# Patient Record
Sex: Male | Born: 1950
Health system: Southern US, Community
[De-identification: ages and names within clinical notes are randomized; demographics above are authoritative.]

## PROBLEM LIST (undated history)

## (undated) DIAGNOSIS — I10 Essential (primary) hypertension: Secondary | ICD-10-CM

## (undated) DIAGNOSIS — C3490 Malignant neoplasm of unspecified part of unspecified bronchus or lung: Secondary | ICD-10-CM

## (undated) DIAGNOSIS — C801 Malignant (primary) neoplasm, unspecified: Secondary | ICD-10-CM

## (undated) DIAGNOSIS — E785 Hyperlipidemia, unspecified: Secondary | ICD-10-CM

## (undated) DIAGNOSIS — R0600 Dyspnea, unspecified: Secondary | ICD-10-CM

## (undated) DIAGNOSIS — C61 Malignant neoplasm of prostate: Secondary | ICD-10-CM

## (undated) HISTORY — DX: Essential (primary) hypertension: I10

## (undated) HISTORY — DX: Malignant neoplasm of prostate: C61

## (undated) HISTORY — DX: Hyperlipidemia, unspecified: E78.5

---
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
## 1986-12-27 HISTORY — PX: HERNIA REPAIR: SHX51

## 2005-03-12 ENCOUNTER — Ambulatory Visit (HOSPITAL_COMMUNITY): Admission: RE | Admit: 2005-03-12 | Discharge: 2005-03-12 | Payer: Self-pay | Admitting: Gastroenterology

## 2011-08-02 ENCOUNTER — Ambulatory Visit (INDEPENDENT_AMBULATORY_CARE_PROVIDER_SITE_OTHER): Payer: Self-pay | Admitting: General Surgery

## 2011-09-20 ENCOUNTER — Ambulatory Visit (INDEPENDENT_AMBULATORY_CARE_PROVIDER_SITE_OTHER): Payer: 59 | Admitting: General Surgery

## 2011-09-20 ENCOUNTER — Encounter (INDEPENDENT_AMBULATORY_CARE_PROVIDER_SITE_OTHER): Payer: Self-pay | Admitting: General Surgery

## 2011-09-20 VITALS — BP 136/90 | HR 91 | Temp 98.2°F | Ht 68.0 in | Wt 164.0 lb

## 2011-09-20 DIAGNOSIS — K429 Umbilical hernia without obstruction or gangrene: Secondary | ICD-10-CM | POA: Insufficient documentation

## 2011-09-20 NOTE — Progress Notes (Signed)
Chief Complaint  Patient presents with  . Other    eval umb hernia    HPI Anthony Escobar is a 60 y.o. male.  Referred by Dr. Jeani Hawking. HPI This is a 60 year old male who has a history of bilateral groin hernias repaired. He has a recent colonoscopy that appears to be fairly normal. He went in to see Dr. Elnoria Howard following a colonoscopy complaining of some abdominal discomfort and some bloating. He has noticed some pain and a bulge in the region near his umbilicus as well since then. This area goes away when he is lying down and really doesn't cause him to much discomfort. He comes in today to discuss repair of this hernia.  History reviewed. No pertinent past medical history.  Past Surgical History  Procedure Date  . Hernia repair 1988    bilateral inguinal    Family History  Problem Relation Age of Onset  . Stroke Mother   . Cancer Father     esophageal    Social History History  Substance Use Topics  . Smoking status: Former Smoker    Types: Cigarettes    Quit date: 08/20/2011  . Smokeless tobacco: Not on file  . Alcohol Use: Yes     1beer a day    No Known Allergies  Current Outpatient Prescriptions  Medication Sig Dispense Refill  . amitriptyline (ELAVIL) 25 MG tablet Take 25 mg by mouth at bedtime.          Review of Systems Review of Systems  Constitutional: Negative.   HENT: Negative.   Eyes: Negative.   Respiratory: Negative.   Cardiovascular: Negative.   Gastrointestinal: Negative.   Genitourinary: Negative.   Musculoskeletal: Negative.   Neurological: Negative.   Hematological: Negative.   Psychiatric/Behavioral: Negative.     Blood pressure 136/90, pulse 91, temperature 98.2 F (36.8 C), height 5\' 8"  (1.727 m), weight 164 lb (74.39 kg).  Physical Exam Physical Exam  Constitutional: He appears well-developed and well-nourished.  Neck: Neck supple. No thyromegaly present.  Cardiovascular: Normal rate, regular rhythm and normal heart sounds.    Pulmonary/Chest: Effort normal and breath sounds normal. He has no wheezes. He has no rales.  Abdominal: Soft. Bowel sounds are normal. There is no tenderness. A hernia (umbilical hernia nontender, reducible) is present.  Lymphadenopathy:    He has no cervical adenopathy.     Assessment    Umbilical hernia    Plan    We discussed repair of this mildly symptomatic umbilical hernia. Recommended repair due to the fact that one large with time he is symptomatic as well as the small risk of having incarceration and needed emergency operation. We discussed an open umbilical hernia peritonitis the with mesh. The risks that include but are not limited to bleeding, infection, recurrence, seroma. He understands the postoperative were prescriptions and will plan on scheduling per his request in November.       Vernadette Stutsman 09/20/2011, 5:16 PM

## 2012-12-19 ENCOUNTER — Ambulatory Visit (INDEPENDENT_AMBULATORY_CARE_PROVIDER_SITE_OTHER): Payer: Medicare HMO | Admitting: Family Medicine

## 2012-12-19 VITALS — BP 144/91 | HR 98 | Temp 98.2°F | Resp 18 | Ht 67.0 in | Wt 167.0 lb

## 2012-12-19 DIAGNOSIS — M79644 Pain in right finger(s): Secondary | ICD-10-CM

## 2012-12-19 DIAGNOSIS — M79609 Pain in unspecified limb: Secondary | ICD-10-CM

## 2012-12-19 DIAGNOSIS — Z23 Encounter for immunization: Secondary | ICD-10-CM

## 2012-12-19 DIAGNOSIS — S61209A Unspecified open wound of unspecified finger without damage to nail, initial encounter: Secondary | ICD-10-CM

## 2012-12-19 MED ORDER — CEPHALEXIN 500 MG PO CAPS: 500.0000 mg | ORAL_CAPSULE | Freq: Three times a day (TID) | ORAL | Status: DC

## 2012-12-19 MED ORDER — TETANUS-DIPHTH-ACELL PERTUSSIS 5-2.5-18.5 LF-MCG/0.5 IM SUSP: 0.5000 mL | Freq: Once | INTRAMUSCULAR | Status: DC

## 2012-12-19 NOTE — Patient Instructions (Addendum)

## 2012-12-19 NOTE — Progress Notes (Signed)
61 year old gentleman comes in with laceration to left index finger which occurred several hours before arrival today. He is working on his car with a grinding wheel and it slipped and lacerated the proximal phalanx on the thumb side of his index finger. He's having no pain in the bleeding is stopped. Has full sedation the tip of his finger and is able to move the finger normally. He says his last tetanus shot was over 10 years ago.  Objective: No acute distress Examination of left index finger reveals a 2 cm laceration on the thumb side of his left index finger which extends into the pulp of the finger. There is no active bleeding. He has full sensation on the tip of his finger and he is good range of motion both flexion, extension, and abduction and adduction.l  Assessment: Acute laceration to left index finger with no complications regarding tendons, vascular structures, or nerve.

## 2012-12-19 NOTE — Progress Notes (Signed)
Procedure:  Consent obtained.  Local anesthesia with 1% lido.  Penrose drain used for hemostasis.  Irrigated due to debride in wound - dark flakes of something removed.  Bone seen and rough edges were felt.  No tendons seen.  Some vessels seen - but hemostasis was achieved without difficulty.  Wound closed with 5-0 Prolene #4 horizontal sutures and #1 SI.  Finger splint placed to decrease movement of joint.  Wound was ~ 2 cm in length. Pt had vasovagal response during procedure and his legs were elevated and cool clothes were placed on his forehead.  He left feeling fine.

## 2013-02-05 ENCOUNTER — Encounter (INDEPENDENT_AMBULATORY_CARE_PROVIDER_SITE_OTHER): Payer: 59 | Admitting: General Surgery

## 2013-02-20 ENCOUNTER — Encounter (INDEPENDENT_AMBULATORY_CARE_PROVIDER_SITE_OTHER): Payer: Self-pay | Admitting: General Surgery

## 2013-02-20 ENCOUNTER — Ambulatory Visit (INDEPENDENT_AMBULATORY_CARE_PROVIDER_SITE_OTHER): Payer: Managed Care, Other (non HMO) | Admitting: General Surgery

## 2013-02-20 VITALS — BP 140/90 | HR 106 | Temp 98.2°F | Resp 18 | Ht 68.0 in | Wt 168.2 lb

## 2013-02-20 DIAGNOSIS — K429 Umbilical hernia without obstruction or gangrene: Secondary | ICD-10-CM

## 2013-02-20 NOTE — Progress Notes (Signed)
Patient ID: Anthony Escobar, male   DOB: September 04, 1951, 62 y.o.   MRN: [id number]  Chief Complaint  Patient presents with  . Routine Post Op    rck umb hernia    HPI Anthony Escobar is a 62 y.o. male.   HPI 84 yom Who I initially saw in September of 2012 with a newly diagnosed umbilical hernia. He noticed some pain and bulge in the region his umbilicus. This still remains there is not really changed since our last visit. He comes in now as he would like to have a discussion about having this fixed. History reviewed. No pertinent past medical history.  Past Surgical History  Procedure Laterality Date  . Hernia repair  1988    bilateral inguinal    Family History  Problem Relation Age of Onset  . Stroke Mother   . Cancer Father     esophageal    Social History History  Substance Use Topics  . Smoking status: Former Smoker    Types: Cigarettes    Quit date: 08/20/2011  . Smokeless tobacco: Not on file  . Alcohol Use: Yes     Comment: 1beer a day    No Known Allergies  Current Outpatient Prescriptions  Medication Sig Dispense Refill  . amitriptyline (ELAVIL) 25 MG tablet Take 25 mg by mouth at bedtime.         No current facility-administered medications for this visit.    Review of Systems Review of Systems  Constitutional: Negative for fever, chills and unexpected weight change.  HENT: Negative for hearing loss, congestion, sore throat, trouble swallowing and voice change.   Eyes: Negative for visual disturbance.  Respiratory: Negative for cough and wheezing.   Cardiovascular: Negative for chest pain, palpitations and leg swelling.  Gastrointestinal: Negative for nausea, vomiting, abdominal pain, diarrhea, constipation, blood in stool, abdominal distention, anal bleeding and rectal pain.  Genitourinary: Negative for hematuria and difficulty urinating.  Musculoskeletal: Negative for arthralgias.  Skin: Negative for rash and wound.  Neurological: Negative for  seizures, syncope, weakness and headaches.  Hematological: Negative for adenopathy. Does not bruise/bleed easily.  Psychiatric/Behavioral: Negative for confusion.    Blood pressure 140/90, pulse 106, temperature 98.2 F (36.8 C), temperature source Temporal, resp. rate 18, height 5\' 8"  (1.727 m), weight 168 lb 3.2 oz (76.295 kg).  Physical Exam Physical Exam  Vitals reviewed. Constitutional: He appears well-developed and well-nourished.  Eyes: No scleral icterus.  Cardiovascular: Normal rate, regular rhythm and normal heart sounds.   Pulmonary/Chest: Effort normal. He has no wheezes. He has no rales.  Abdominal: Soft. Bowel sounds are normal. There is no tenderness. A hernia (reducible umbilical hernia) is present.  Lymphadenopathy:    He has no cervical adenopathy.      Assessment    Umbilical hernia     Plan    Umbilical hernia possibly with mesh We discussed observation versus repair.  We discussed open inguinal hernia repairs. I described the procedure in detail.   Goals should be achieved with surgery. We discussed the usage of mesh and the rationale behind that. We went over the pathophysiology of umbilical hernia. We have elected to perform open umbilical hernia repair possibly with mesh.  We discussed the risks including bleeding, infection, recurrence, postoperative pain, injury to surrounding structures         Nycole Kawahara 02/20/2013, 4:24 PM

## 2013-05-23 ENCOUNTER — Other Ambulatory Visit (INDEPENDENT_AMBULATORY_CARE_PROVIDER_SITE_OTHER): Payer: Self-pay | Admitting: General Surgery

## 2013-05-23 LAB — CBC WITH DIFFERENTIAL/PLATELET
Eosinophils Absolute: 0.1 10*3/uL (ref 0.0–0.7)
Eosinophils Relative: 1 % (ref 0–5)
HCT: 40.4 % (ref 39.0–52.0)
Hemoglobin: 13.6 g/dL (ref 13.0–17.0)
Lymphocytes Relative: 21 % (ref 12–46)
Lymphs Abs: 1.7 10*3/uL (ref 0.7–4.0)
MCH: 32.4 pg (ref 26.0–34.0)
MCV: 96.2 fL (ref 78.0–100.0)
Monocytes Absolute: 0.7 10*3/uL (ref 0.1–1.0)
Monocytes Relative: 9 % (ref 3–12)
RBC: 4.2 MIL/uL — ABNORMAL LOW (ref 4.22–5.81)
WBC: 8 10*3/uL (ref 4.0–10.5)

## 2013-05-23 LAB — BASIC METABOLIC PANEL
CO2: 23 mEq/L (ref 19–32)
Calcium: 9.4 mg/dL (ref 8.4–10.5)
Chloride: 105 mEq/L (ref 96–112)
Creat: 0.88 mg/dL (ref 0.50–1.35)
Glucose, Bld: 93 mg/dL (ref 70–99)

## 2013-05-24 DIAGNOSIS — K429 Umbilical hernia without obstruction or gangrene: Secondary | ICD-10-CM

## 2013-05-28 ENCOUNTER — Encounter (INDEPENDENT_AMBULATORY_CARE_PROVIDER_SITE_OTHER): Payer: Self-pay | Admitting: General Surgery

## 2013-05-28 ENCOUNTER — Telehealth (INDEPENDENT_AMBULATORY_CARE_PROVIDER_SITE_OTHER): Payer: Self-pay | Admitting: General Surgery

## 2013-05-28 NOTE — Telephone Encounter (Signed)
Spoke with patient he will come to office to schedule his po f/u appt . He has ask for a return to work letter that Dr Dwain Sarna will be writing today as well .Marland Kitchen

## 2013-05-29 ENCOUNTER — Telehealth (INDEPENDENT_AMBULATORY_CARE_PROVIDER_SITE_OTHER): Payer: Self-pay

## 2013-05-29 NOTE — Telephone Encounter (Signed)
Patient calling into office regarding his RTW note date.  Patient has returned to work and needed the date changed to 05/29/13.  New RTW letter created and faxed to [phone] attn: Seymour Bars.

## 2013-10-01 ENCOUNTER — Ambulatory Visit (INDEPENDENT_AMBULATORY_CARE_PROVIDER_SITE_OTHER): Payer: Managed Care, Other (non HMO) | Admitting: General Surgery

## 2013-10-01 ENCOUNTER — Encounter (INDEPENDENT_AMBULATORY_CARE_PROVIDER_SITE_OTHER): Payer: Self-pay | Admitting: General Surgery

## 2013-10-01 VITALS — BP 130/74 | HR 76 | Temp 98.8°F | Resp 14 | Ht 68.0 in | Wt 151.6 lb

## 2013-10-01 DIAGNOSIS — Z09 Encounter for follow-up examination after completed treatment for conditions other than malignant neoplasm: Secondary | ICD-10-CM

## 2013-10-01 DIAGNOSIS — R141 Gas pain: Secondary | ICD-10-CM

## 2013-10-01 DIAGNOSIS — R140 Abdominal distension (gaseous): Secondary | ICD-10-CM

## 2013-10-01 NOTE — Progress Notes (Signed)
Subjective:     Patient ID: Anthony Escobar, male   DOB: [birth year], 62 y.o.   MRN: [id number]  HPI 30 yom s/p UH repair from which he is doing well on May 29.  He has recovered from that just fine.  He reports though that he has abdominal bloating, thinner stools, nausea since I have last seen him.  He does have colonoscopy 2-3 years ago by Dr Elnoria Howard that he said was normal  Appetite not as good and has lost about 30 pounds (some intentionally he says).  No BRBPR.  Review of Systems     Objective:   Physical Exam Abdomen soft, nontender, incision healed UH    Assessment:     S/p UH repair Abdominal bloating, weight loss, thin stools    Plan:     I dont' think this is related to surgery.  I am concerned about his symptoms and will refer him back to see Dr Elnoria Howard as well as obtain a ct scan.

## 2013-10-03 ENCOUNTER — Ambulatory Visit
Admission: RE | Admit: 2013-10-03 | Discharge: 2013-10-03 | Disposition: A | Discharge status: Home / Self Care | Payer: Managed Care, Other (non HMO) | Source: Ambulatory Visit | Attending: General Surgery | Admitting: General Surgery

## 2013-10-03 DIAGNOSIS — R140 Abdominal distension (gaseous): Secondary | ICD-10-CM

## 2013-10-03 MED ORDER — IOHEXOL 300 MG/ML  SOLN
100.0000 mL | Freq: Once | INTRAMUSCULAR | Status: AC | PRN
  Administered 2013-10-03: 100 mL via INTRAVENOUS

## 2013-10-04 ENCOUNTER — Telehealth (INDEPENDENT_AMBULATORY_CARE_PROVIDER_SITE_OTHER): Payer: Self-pay

## 2013-10-04 NOTE — Telephone Encounter (Signed)
Called Anthony Escobar to notify him that we did receive the CT results which show some thickening of the colon but no masses or abscess per Dr Dwain Sarna. The Anthony Escobar just needs to get back into see Dr Elnoria Howard for a colonoscopy. The Anthony Escobar will call Dr Haywood Pao office to schedule appt.

## 2013-10-08 ENCOUNTER — Other Ambulatory Visit: Payer: Self-pay

## 2013-10-11 NOTE — Telephone Encounter (Signed)
Pt has an appt with Dr Elnoria Howard on 10/15/13 arrive at 1:50/2:00.

## 2013-11-01 ENCOUNTER — Other Ambulatory Visit: Payer: Self-pay

## 2013-11-08 ENCOUNTER — Other Ambulatory Visit: Payer: Self-pay

## 2017-10-18 ENCOUNTER — Ambulatory Visit (INDEPENDENT_AMBULATORY_CARE_PROVIDER_SITE_OTHER): Payer: PRIVATE HEALTH INSURANCE | Admitting: Emergency Medicine

## 2017-10-18 ENCOUNTER — Telehealth: Payer: Self-pay | Admitting: Emergency Medicine

## 2017-10-18 ENCOUNTER — Encounter: Payer: Self-pay | Admitting: Emergency Medicine

## 2017-10-18 ENCOUNTER — Ambulatory Visit (INDEPENDENT_AMBULATORY_CARE_PROVIDER_SITE_OTHER)
Admission: RE | Admit: 2017-10-18 | Discharge: 2017-10-18 | Disposition: A | Discharge status: Home / Self Care | Payer: Medicare Other | Source: Ambulatory Visit | Attending: Emergency Medicine | Admitting: Emergency Medicine

## 2017-10-18 VITALS — BP 102/62 | HR 100 | Ht 68.0 in | Wt 164.0 lb

## 2017-10-18 DIAGNOSIS — R059 Cough, unspecified: Secondary | ICD-10-CM | POA: Insufficient documentation

## 2017-10-18 DIAGNOSIS — R911 Solitary pulmonary nodule: Secondary | ICD-10-CM

## 2017-10-18 DIAGNOSIS — J449 Chronic obstructive pulmonary disease, unspecified: Secondary | ICD-10-CM | POA: Insufficient documentation

## 2017-10-18 DIAGNOSIS — Z720 Tobacco use: Secondary | ICD-10-CM | POA: Diagnosis not present

## 2017-10-18 DIAGNOSIS — R05 Cough: Secondary | ICD-10-CM | POA: Diagnosis not present

## 2017-10-18 DIAGNOSIS — R0609 Other forms of dyspnea: Secondary | ICD-10-CM

## 2017-10-18 NOTE — Progress Notes (Signed)
Subjective:    Patient ID: Anthony Escobar, male    DOB: [birth year], 66 y.o.   MRN: [id number]  HPI 66 year old former smoker (56 pack years, quit 1 year ago), with a history of hypertension, hyperlipidemia.  He is a self-referral today to evaluate shortness of breath. He first noticed this 2-3 months ago, found that he was having more chest congestion as well. He retired this Summer, has been doing a bit less since then. When he does outside yard work, bicycling. He is able to cough up mucous, often at night. He hears some wheeze at night as well, when he exerts. He has noticed some L costal marginal pain from coughing. He has been on lisinopril for about 2 yrs. He has never had an overt exacerbation of COPD. He has a globus sensation. He just restarted tagamet last week to see if it would help w cough. He has some mild baseline nasal congestion   Review of Systems  Constitutional: Negative for fever and unexpected weight change.  HENT: Negative for congestion, dental problem, ear pain, nosebleeds, postnasal drip, rhinorrhea, sinus pressure, sneezing, sore throat and trouble swallowing.   Eyes: Negative for redness and itching.  Respiratory: Positive for chest tightness and shortness of breath. Negative for cough and wheezing.   Cardiovascular: Negative for palpitations and leg swelling.  Gastrointestinal: Negative for nausea and vomiting.  Genitourinary: Negative for dysuria.  Musculoskeletal: Negative for joint swelling.  Skin: Negative for rash.  Neurological: Negative for headaches.  Hematological: Does not bruise/bleed easily.  Psychiatric/Behavioral: Negative for dysphoric mood. The patient is not nervous/anxious.     Past Medical History:  Diagnosis Date  . Hyperlipidemia   . Hypertension      Family History  Problem Relation Age of Onset  . Stroke Mother   . Cancer Father        esophageal     Social History   Social History  . Marital status: Single    Spouse  name: N/A  . Number of children: N/A  . Years of education: N/A   Occupational History  . Not on file.   Social History Main Topics  . Smoking status: Former Smoker    Packs/day: 1.50    Years: 50.00    Types: Cigarettes    Quit date: 08/20/2011  . Smokeless tobacco: Never Used  . Alcohol use Yes     Comment: 1beer a day  . Drug use: No  . Sexual activity: Not on file   Other Topics Concern  . Not on file   Social History Narrative  . 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  [place] native.   No Known Allergies   Outpatient Medications Prior to Visit  Medication Sig Dispense Refill  . lisinopril (PRINIVIL,ZESTRIL) 10 MG tablet Take 10 mg by mouth daily.     . Vitamin D, Ergocalciferol, (DRISDOL) 50000 UNITS CAPS capsule     . zolpidem (AMBIEN) 5 MG tablet      No facility-administered medications prior to visit.         Objective:   Physical Exam Vitals:   10/18/17 1123 10/18/17 1124  BP:  102/62  Pulse:  100  SpO2:  95%  Weight: 164 lb (74.4 kg)   Height: 5\' 8"  (1.727 m)    Gen: Pleasant, well-nourished, in no distress,  normal affect  ENT: No lesions,  mouth clear,  oropharynx clear, no postnasal drip  Neck: No  JVD, no stridor  Lungs: No use of accessory muscles, clear during a normal respiratory cycle, he does wheeze with forced expiration  Cardiovascular: RRR, heart sounds normal, no murmur or gallops, no peripheral edema  Musculoskeletal: No deformities, no cyanosis or clubbing  Neuro: alert, non focal  Skin: Warm, no lesions or rashes     Assessment & Plan:  Dyspnea on exertion Suspect due to evolving COPD.  He no longer smokes.  We will perform pulmonary function testing, do a trial of short acting bronchodilators to see if he benefits.  If he does and if the pulmonary function testing is consistent with obstruction then we will consider starting scheduled bronchodilators.  Cough Associated  with his COPD.  Chest x-ray today.  Consider contribution of his lisinopril, may need to stop this at some point in the future.  Tobacco use Former tobacco, quit 1 year ago.  He would qualify for low-dose CT scan screening if his pulmonary function is suitable to consider primary resection of any identified abnormality.  I will discussed this with him further at follow-up visit.  Baltazar Apo, MD, PhD 10/18/2017, 11:50 AM [facility] [phone] or if no answer [phone]

## 2017-10-18 NOTE — Telephone Encounter (Signed)
RB has been made aware of the pt's CXR results.  Per RB >> There are nodules present and I am unclear what they are. Pt needs to have a CT chest with contrast to evaluate.  Spoke with pt. He is aware of his results. Order has been placed for the CT and a BMET. Nothing further was needed.

## 2017-10-18 NOTE — Assessment & Plan Note (Signed)
Associated with his COPD.  Chest x-ray today.  Consider contribution of his lisinopril, may need to stop this at some point in the future.

## 2017-10-18 NOTE — Patient Instructions (Addendum)
We will perform pulmonary function testing We will perform a CXR today Try using albuterol 2 puffs as needed for shortness of breath.  You can use this up to every 4 hours if you were to need it.  You might want to consider pretreating exercise or strenuous activity to see if it makes the activity easier to tolerate with less shortness of breath. Follow with Dr Lamonte Sakai next available with full PFT on the same day

## 2017-10-18 NOTE — Telephone Encounter (Signed)
Please txt me the results of CXR today when report is available - I question whether there may be pulm nodules present (versus vessels) He wanted to get feedback about the results today. Thanks/

## 2017-10-18 NOTE — Assessment & Plan Note (Signed)
Suspect due to evolving COPD.  He no longer smokes.  We will perform pulmonary function testing, do a trial of short acting bronchodilators to see if he benefits.  If he does and if the pulmonary function testing is consistent with obstruction then we will consider starting scheduled bronchodilators.

## 2017-10-18 NOTE — Assessment & Plan Note (Signed)
Former tobacco, quit 1 year ago.  He would qualify for low-dose CT scan screening if his pulmonary function is suitable to consider primary resection of any identified abnormality.  I will discussed this with him further at follow-up visit.

## 2017-10-19 ENCOUNTER — Other Ambulatory Visit (INDEPENDENT_AMBULATORY_CARE_PROVIDER_SITE_OTHER): Payer: Medicare Other

## 2017-10-19 DIAGNOSIS — R911 Solitary pulmonary nodule: Secondary | ICD-10-CM | POA: Diagnosis not present

## 2017-10-19 LAB — BASIC METABOLIC PANEL
BUN: 19 mg/dL (ref 6–23)
CALCIUM: 9.5 mg/dL (ref 8.4–10.5)
CO2: 27 meq/L (ref 19–32)
Chloride: 102 mEq/L (ref 96–112)
Creatinine, Ser: 1.08 mg/dL (ref 0.40–1.50)
GFR: 72.64 mL/min (ref >60.00)
Glucose, Bld: 89 mg/dL (ref 70–99)
Potassium: 5 mEq/L (ref 3.5–5.1)
Sodium: 137 mEq/L (ref 135–145)

## 2017-10-20 ENCOUNTER — Ambulatory Visit (INDEPENDENT_AMBULATORY_CARE_PROVIDER_SITE_OTHER)
Admission: RE | Admit: 2017-10-20 | Discharge: 2017-10-20 | Disposition: A | Discharge status: Home / Self Care | Payer: PRIVATE HEALTH INSURANCE | Source: Ambulatory Visit | Attending: Emergency Medicine | Admitting: Emergency Medicine

## 2017-10-20 DIAGNOSIS — R911 Solitary pulmonary nodule: Secondary | ICD-10-CM

## 2017-10-20 MED ORDER — IOPAMIDOL (ISOVUE-M 300) INJECTION 61%
15.0000 mL | Freq: Once | INTRAMUSCULAR | Status: AC | PRN
  Administered 2017-10-20: 80 mL via INTRATHECAL

## 2017-10-24 ENCOUNTER — Telehealth: Payer: Self-pay | Admitting: Emergency Medicine

## 2017-10-24 NOTE — Telephone Encounter (Signed)
Pt requesting CT scan results, pt contact # [phone]

## 2017-10-24 NOTE — Telephone Encounter (Signed)
RB can you review CT scan results?

## 2017-10-24 NOTE — Telephone Encounter (Signed)
There are multiple small pulmonary nodules present. Their cause is unclear, but could be infectious, inflammatory or possibly even cancerous. He needs to follow with me so we can make a plan to investigate these. Make sure he has OV next available.

## 2017-10-24 NOTE — Telephone Encounter (Signed)
Spoke with the pt and notified of results per RB and he verbalized understanding  Appt was scheduled

## 2017-10-28 ENCOUNTER — Ambulatory Visit (INDEPENDENT_AMBULATORY_CARE_PROVIDER_SITE_OTHER): Payer: PRIVATE HEALTH INSURANCE | Admitting: Emergency Medicine

## 2017-10-28 ENCOUNTER — Other Ambulatory Visit (INDEPENDENT_AMBULATORY_CARE_PROVIDER_SITE_OTHER): Payer: Medicare Other

## 2017-10-28 ENCOUNTER — Encounter: Payer: Self-pay | Admitting: Emergency Medicine

## 2017-10-28 DIAGNOSIS — R911 Solitary pulmonary nodule: Secondary | ICD-10-CM

## 2017-10-28 DIAGNOSIS — R918 Other nonspecific abnormal finding of lung field: Secondary | ICD-10-CM | POA: Insufficient documentation

## 2017-10-28 DIAGNOSIS — R0609 Other forms of dyspnea: Secondary | ICD-10-CM | POA: Diagnosis not present

## 2017-10-28 LAB — PSA: PSA: 19.57 ng/mL — AB (ref 0.10–4.00)

## 2017-10-28 MED ORDER — ALBUTEROL SULFATE HFA 108 (90 BASE) MCG/ACT IN AERS: 2.0000 | INHALATION_SPRAY | RESPIRATORY_TRACT | 6 refills | Status: DC | PRN

## 2017-10-28 NOTE — Assessment & Plan Note (Signed)
Bilateral pulmonary nodules of varying sizes.  All are round and the overall picture is concerning for metastatic disease.  No clear primary cancer currently evident.  I believe he needs a PET scan as soon as possible.  I will check some screening serologies for tumor markers.  He needs an echocardiogram to ensure no evidence for endocarditis although I doubt infection in this case.  Based on location and size probably a transthoracic needle biopsy of the right lower lobe nodule will be the highest yield maneuver.  We will arrange for this after the PET scan.

## 2017-10-28 NOTE — Assessment & Plan Note (Signed)
I will start him empirically on albuterol to see if he benefits.

## 2017-10-28 NOTE — Patient Instructions (Addendum)
Please start using albuterol 2 puffs up to every 4 hours as needed to see if this helps with your breathing  We will get your breathing tests in the future.  We will arrange for a PET scan to better evaluate your pulmonary nodules.  We will perform blood work today.  We will perform an echocardiogram to evaluate your heart valves.  Follow with Dr Lamonte Sakai next available appointment

## 2017-10-28 NOTE — Addendum Note (Signed)
Addended by: Rocky Morel D on: 10/28/2017 03:55 PM   Modules accepted: Orders

## 2017-10-28 NOTE — Progress Notes (Signed)
Subjective:    Patient ID: LEV CERVONE, male    DOB: [birth year], 66 y.o.   MRN: [id number]  HPI 66 year old former smoker (35 pack years, quit 1 year ago), with a history of hypertension, hyperlipidemia.  He is a self-referral today to evaluate shortness of breath. He first noticed this 2-3 months ago, found that he was having more chest congestion as well. He retired this Summer, has been doing a bit less since then. When he does outside yard work, bicycling. He is able to cough up mucous, often at night. He hears some wheeze at night as well, when he exerts. He has noticed some L costal marginal pain from coughing. He has been on lisinopril for about 2 yrs. He has never had an overt exacerbation of COPD. He has a globus sensation. He just restarted tagamet last week to see if it would help w cough. He has some mild baseline nasal congestion  ROV 10/28/17 --this is a follow-up visit for patient with a history of tobacco use, dyspnea.  Performed a chest x-ray at our initial visit 10/23 which suggested pulmonary nodular disease and prompted a CT scan of the chest that was done on 10/20/17.  The scan shows some emphysematous change and numerous small pulmonary nodules seen bilaterally.  The index nodules are in the right lower lobe measuring 10 mm, the lingula measuring 9 mm.  Etiology unclear.  PFTs have not yet been done.  He is here to review the scan. He is up to date on colon CA screening. No GI complaints, no testicular mass reported, no urinary dysfxn.    Review of Systems  Constitutional: Negative for fever and unexpected weight change.  HENT: Negative for congestion, dental problem, ear pain, nosebleeds, postnasal drip, rhinorrhea, sinus pressure, sneezing, sore throat and trouble swallowing.   Eyes: Negative for redness and itching.  Respiratory: Positive for chest tightness and shortness of breath. Negative for cough and wheezing.   Cardiovascular: Negative for palpitations and leg  swelling.  Gastrointestinal: Negative for nausea and vomiting.  Genitourinary: Negative for dysuria.  Musculoskeletal: Negative for joint swelling.  Skin: Negative for rash.  Neurological: Negative for headaches.  Hematological: Does not bruise/bleed easily.  Psychiatric/Behavioral: Negative for dysphoric mood. The patient is not nervous/anxious.     Past Medical History:  Diagnosis Date  . Hyperlipidemia   . Hypertension      Family History  Problem Relation Age of Onset  . Stroke Mother   . Cancer Father        esophageal     Social History   Social History  . Marital status: Single    Spouse name: N/A  . Number of children: N/A  . Years of education: N/A   Occupational History  . Not on file.   Social History Main Topics  . Smoking status: Former Smoker    Packs/day: 1.50    Years: 50.00    Types: Cigarettes    Quit date: 08/20/2011  . Smokeless tobacco: Never Used  . Alcohol use Yes     Comment: 1beer a day  . Drug use: No  . Sexual activity: Not on file   Other Topics Concern  . Not on file   Social History Narrative  . 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  [place] native.   No Known Allergies   Outpatient Medications Prior to Visit  Medication Sig Dispense Refill  . amitriptyline (ELAVIL) 75  MG tablet Take 75 mg by mouth at bedtime.    Marland Kitchen atorvastatin (LIPITOR) 10 MG tablet Take 10 mg by mouth daily.    Marland Kitchen lisinopril (PRINIVIL,ZESTRIL) 10 MG tablet Take 10 mg by mouth daily.      No facility-administered medications prior to visit.         Objective:   Physical Exam Vitals:   10/28/17 1511  BP: 124/88  Pulse: (!) 110  SpO2: 98%  Weight: 160 lb (72.6 kg)  Height: 5\' 8"  (1.727 m)   Gen: Pleasant, well-nourished, in no distress,  normal affect  ENT: No lesions,  mouth clear,  oropharynx clear, no postnasal drip  Neck: No JVD, no stridor  Lungs: No use of accessory muscles, clear  during a normal respiratory cycle, he does wheeze with forced expiration  Cardiovascular: RRR, heart sounds normal, no murmur or gallops, no peripheral edema  Musculoskeletal: No deformities, no cyanosis or clubbing  Neuro: alert, non focal  Skin: Warm, no lesions or rashes     Assessment & Plan:  Multiple pulmonary nodules Bilateral pulmonary nodules of varying sizes.  All are round and the overall picture is concerning for metastatic disease.  No clear primary cancer currently evident.  I believe he needs a PET scan as soon as possible.  I will check some screening serologies for tumor markers.  He needs an echocardiogram to ensure no evidence for endocarditis although I doubt infection in this case.  Based on location and size probably a transthoracic needle biopsy of the right lower lobe nodule will be the highest yield maneuver.  We will arrange for this after the PET scan.  Dyspnea on exertion I will start him empirically on albuterol to see if he benefits.  Baltazar Apo, MD, PhD 10/28/2017, 3:51 PM [facility] [phone] or if no answer [phone]

## 2017-10-31 LAB — AFP TUMOR MARKER: AFP-Tumor Marker: 5.9 ng/mL (ref <6.1)

## 2017-10-31 LAB — HCG, SERUM, QUALITATIVE: PREG SERUM: NEGATIVE

## 2017-10-31 LAB — CANCER ANTIGEN 19-9: CA 19-9: 25 U/mL (ref <34)

## 2017-10-31 LAB — CEA: CEA: 3.3 ng/mL — AB

## 2017-11-03 ENCOUNTER — Ambulatory Visit (HOSPITAL_COMMUNITY): Payer: PRIVATE HEALTH INSURANCE | Attending: Cardiology

## 2017-11-03 ENCOUNTER — Other Ambulatory Visit: Payer: Self-pay

## 2017-11-03 DIAGNOSIS — I10 Essential (primary) hypertension: Secondary | ICD-10-CM | POA: Diagnosis not present

## 2017-11-03 DIAGNOSIS — R911 Solitary pulmonary nodule: Secondary | ICD-10-CM | POA: Diagnosis not present

## 2017-11-03 DIAGNOSIS — I059 Rheumatic mitral valve disease, unspecified: Secondary | ICD-10-CM | POA: Insufficient documentation

## 2017-11-03 DIAGNOSIS — E785 Hyperlipidemia, unspecified: Secondary | ICD-10-CM | POA: Insufficient documentation

## 2017-11-03 DIAGNOSIS — Z8249 Family history of ischemic heart disease and other diseases of the circulatory system: Secondary | ICD-10-CM | POA: Diagnosis not present

## 2017-11-03 DIAGNOSIS — R002 Palpitations: Secondary | ICD-10-CM | POA: Diagnosis not present

## 2017-11-03 DIAGNOSIS — R42 Dizziness and giddiness: Secondary | ICD-10-CM | POA: Diagnosis not present

## 2017-11-03 DIAGNOSIS — Z87891 Personal history of nicotine dependence: Secondary | ICD-10-CM | POA: Insufficient documentation

## 2017-11-03 DIAGNOSIS — R0600 Dyspnea, unspecified: Secondary | ICD-10-CM | POA: Insufficient documentation

## 2017-11-03 DIAGNOSIS — I77810 Thoracic aortic ectasia: Secondary | ICD-10-CM | POA: Insufficient documentation

## 2017-11-05 ENCOUNTER — Ambulatory Visit (HOSPITAL_COMMUNITY)
Admission: RE | Admit: 2017-11-05 | Discharge: 2017-11-05 | Disposition: A | Discharge status: Home / Self Care | Payer: PRIVATE HEALTH INSURANCE | Source: Ambulatory Visit | Attending: Emergency Medicine | Admitting: Emergency Medicine

## 2017-11-05 DIAGNOSIS — R9389 Abnormal findings on diagnostic imaging of other specified body structures: Secondary | ICD-10-CM | POA: Diagnosis not present

## 2017-11-05 DIAGNOSIS — C7802 Secondary malignant neoplasm of left lung: Secondary | ICD-10-CM | POA: Insufficient documentation

## 2017-11-05 DIAGNOSIS — C7801 Secondary malignant neoplasm of right lung: Secondary | ICD-10-CM | POA: Insufficient documentation

## 2017-11-05 DIAGNOSIS — C7951 Secondary malignant neoplasm of bone: Secondary | ICD-10-CM | POA: Insufficient documentation

## 2017-11-05 DIAGNOSIS — R911 Solitary pulmonary nodule: Secondary | ICD-10-CM | POA: Diagnosis not present

## 2017-11-05 LAB — GLUCOSE, CAPILLARY: Glucose-Capillary: 99 mg/dL (ref 65–99)

## 2017-11-05 MED ORDER — FLUDEOXYGLUCOSE F - 18 (FDG) INJECTION
8.0000 | Freq: Once | INTRAVENOUS | Status: AC | PRN
  Administered 2017-11-05: 8 via INTRAVENOUS

## 2017-11-11 ENCOUNTER — Telehealth: Payer: Self-pay | Admitting: Emergency Medicine

## 2017-11-11 DIAGNOSIS — C61 Malignant neoplasm of prostate: Secondary | ICD-10-CM

## 2017-11-11 NOTE — Telephone Encounter (Signed)
RB please advise on CT results.

## 2017-11-16 NOTE — Telephone Encounter (Signed)
Pt aware that RB is out of the office until 11-22-17 and we will send message as Urgent to reply same day with results.

## 2017-11-22 NOTE — Telephone Encounter (Signed)
Called and spoke to pt. Pt is requesting PET results from 11/05/17.  RB please advise. Thanks.

## 2017-11-22 NOTE — Telephone Encounter (Signed)
Pt calling again about Pet scan results.  He is getting very upset he has not had a call back. tr

## 2017-11-22 NOTE — Telephone Encounter (Signed)
We have to await RB review and instructions.

## 2017-11-22 NOTE — Telephone Encounter (Signed)
Pt still waiting on results from Pet Scan.  [phone]

## 2017-11-22 NOTE — Telephone Encounter (Signed)
I reviewed the results with Anthony Escobar today.  His PET scan shows bilateral scattered hypermetabolic nodules as well as significant hypermetabolism in the prostate.  His elevated PSA and these results suggest that this is metastatic prostate cancer.  I believe the most straightforward approach is urgent referral to urology for biopsy and a plan for therapy.  I discussed this with him and he agrees.  We will make the referral now and have him seen ASAP.

## 2017-11-22 NOTE — Telephone Encounter (Signed)
I have called the patient and gave him the appt for 11/24/17 with Dr Karsten Ro pt aware

## 2017-11-25 ENCOUNTER — Telehealth: Payer: Self-pay | Admitting: Emergency Medicine

## 2017-11-25 MED ORDER — ZOLPIDEM TARTRATE 10 MG PO TABS: 10.0000 mg | ORAL_TABLET | Freq: Every evening | ORAL | 0 refills | Status: DC | PRN

## 2017-11-25 MED ORDER — ALBUTEROL SULFATE HFA 108 (90 BASE) MCG/ACT IN AERS: 2.0000 | INHALATION_SPRAY | RESPIRATORY_TRACT | 6 refills | Status: DC | PRN

## 2017-11-25 NOTE — Telephone Encounter (Signed)
Spoke with pt letting him know I was sending in Rx to [facility] choice [facility] on [address]. Nothing further needed.  Rx [facility] so have called and gave verbal order for Rx to be filled.

## 2017-11-25 NOTE — Telephone Encounter (Signed)
I spoke with the patient and reviewed his studies and his recent visit with Dr. Karsten Ro with urology.  He is planning to undergo prostate biopsy.  He tells me that he is quite nervous and upset about the diagnosis.  He cannot get any sleep and requests a sleep aid.  I will write for Ambien to try and help him sleep in the interim.  His prostate biopsy is scheduled for early January.  I will check with Dr. Karsten Ro to see if it can be pushed sooner.   Please send a script for ambien 10mg  qhs, disp #14, RF 0  Also send script for albuterol, 2 puffs q4h prn to [facility], [address]

## 2017-11-25 NOTE — Telephone Encounter (Signed)
Called and spoke to pt. Pt is upset about recent scan and the results. Pt states RB reviewed the results with him on 11.27.18. Pt is requesting to speak with Dr. Lamonte Escobar again regarding results in more detail and regarding an anti-depressant or anti-anxiety medication. Pt states he is having trouble sleeping at night due to recent results. Pt states he can be reached at [phone] any time today. Pt is requesting a call today.   Dr. Lamonte Escobar please advise. Thanks.

## 2017-11-29 ENCOUNTER — Encounter: Payer: Self-pay | Admitting: Emergency Medicine

## 2017-11-29 ENCOUNTER — Ambulatory Visit: Payer: Medicare Other | Admitting: Emergency Medicine

## 2017-11-29 ENCOUNTER — Ambulatory Visit (INDEPENDENT_AMBULATORY_CARE_PROVIDER_SITE_OTHER): Payer: PRIVATE HEALTH INSURANCE | Admitting: Emergency Medicine

## 2017-11-29 DIAGNOSIS — J449 Chronic obstructive pulmonary disease, unspecified: Secondary | ICD-10-CM | POA: Diagnosis not present

## 2017-11-29 DIAGNOSIS — R918 Other nonspecific abnormal finding of lung field: Secondary | ICD-10-CM

## 2017-11-29 DIAGNOSIS — R0609 Other forms of dyspnea: Secondary | ICD-10-CM

## 2017-11-29 LAB — PULMONARY FUNCTION TEST
DL/VA % pred: 66 %
DL/VA: 2.97 ml/min/mmHg/L
DLCO cor % pred: 64 %
DLCO cor: 19.18 ml/min/mmHg
DLCO unc % pred: 61 %
DLCO unc: 18.13 ml/min/mmHg
FEF 25-75 Post: 1.5 L/sec
FEF 25-75 Pre: 1.14 L/sec
FEF2575-%Change-Post: 31 %
FEF2575-%Pred-Post: 60 %
FEF2575-%Pred-Pre: 46 %
FEV1-%Change-Post: 8 %
FEV1-%Pred-Post: 75 %
FEV1-%Pred-Pre: 69 %
FEV1-Post: 2.37 L
FEV1-Pre: 2.18 L
FEV1FVC-%Change-Post: 0 %
FEV1FVC-%Pred-Pre: 83 %
FEV6-%Change-Post: 7 %
FEV6-%Pred-Post: 93 %
FEV6-%Pred-Pre: 86 %
FEV6-Post: 3.72 L
FEV6-Pre: 3.45 L
FEV6FVC-%Change-Post: 0 %
FEV6FVC-%Pred-Post: 103 %
FEV6FVC-%Pred-Pre: 103 %
FVC-%Change-Post: 8 %
FVC-%Pred-Post: 90 %
FVC-%Pred-Pre: 83 %
FVC-Post: 3.8 L
FVC-Pre: 3.51 L
Post FEV1/FVC ratio: 62 %
Post FEV6/FVC ratio: 98 %
Pre FEV1/FVC ratio: 62 %
Pre FEV6/FVC Ratio: 98 %

## 2017-11-29 MED ORDER — TIOTROPIUM BROMIDE MONOHYDRATE 18 MCG IN CAPS: 18.0000 ug | ORAL_CAPSULE | Freq: Every day | RESPIRATORY_TRACT | 5 refills | Status: DC

## 2017-11-29 NOTE — Patient Instructions (Signed)
Please follow-up with Dr. Karsten Ro with Urology as planned.  We will start a medication called Spiriva 1 inhalation once a day.  Keep your albuterol available to use 2 puffs as needed for shortness of breath, wheezing, chest tightness Follow with Dr Lamonte Sakai in 3 months or sooner if you have any problems.

## 2017-11-29 NOTE — Assessment & Plan Note (Signed)
Clinical history and pulmonary function testing consistent with COPD.  He believes that he benefits some from albuterol although he has not tried it very frequently.  I believe that he would benefit from schedule bronchodilator.  We will start Spiriva once daily.  Continue albuterol as needed.

## 2017-11-29 NOTE — Progress Notes (Signed)
Subjective:    Patient ID: Anthony Escobar, male    DOB: [birth year], 66 y.o.   MRN: [id number]  HPI 66 year old former smoker (50 pack years, quit 1 year ago), with a history of hypertension, hyperlipidemia.  He is a self-referral today to evaluate shortness of breath. He first noticed this 2-3 months ago, found that he was having more chest congestion as well. He retired this Summer, has been doing a bit less since then. When he does outside yard work, bicycling. He is able to cough up mucous, often at night. He hears some wheeze at night as well, when he exerts. He has noticed some L costal marginal pain from coughing. He has been on lisinopril for about 2 yrs. He has never had an overt exacerbation of COPD. He has a globus sensation. He just restarted tagamet last week to see if it would help w cough. He has some mild baseline nasal congestion  ROV 10/28/17 --this is a follow-up visit for patient with a history of tobacco use, dyspnea.  Performed a chest x-ray at our initial visit 10/23 which suggested pulmonary nodular disease and prompted a CT scan of the chest that was done on 10/20/17.  The scan shows some emphysematous change and numerous small pulmonary nodules seen bilaterally.  The index nodules are in the right lower lobe measuring 10 mm, the lingula measuring 9 mm.  Etiology unclear.  PFTs have not yet been done.  He is here to review the scan. He is up to date on colon CA screening. No GI complaints, no testicular mass reported, no urinary dysfxn.   ROV 11/29/17 --Anthony Escobar follows up today for his abnormal chest imaging with bilateral round pulmonary nodules.  The PET scan performed on 11/05/17 showed that these are hypermetabolic and also showed an area of hypermetabolism in the prostate.  He has seen Dr. Karsten Ro with urology.  A prostate biopsy has been arranged.  This is all consistent with metastatic prostate cancer.  He underwent pulmonary function testing today that I have personally  reviewed.  This shows moderately severe obstruction with a borderline bronchodilator response, decreased diffusion capacity. He is using albuterol, believes that it is beneficial. He is having some situational depression, tearfulness.    Review of Systems  Constitutional: Negative for fever and unexpected weight change.  HENT: Negative for congestion, dental problem, ear pain, nosebleeds, postnasal drip, rhinorrhea, sinus pressure, sneezing, sore throat and trouble swallowing.   Eyes: Negative for redness and itching.  Respiratory: Positive for chest tightness and shortness of breath. Negative for cough and wheezing.   Cardiovascular: Negative for palpitations and leg swelling.  Gastrointestinal: Negative for nausea and vomiting.  Genitourinary: Negative for dysuria.  Musculoskeletal: Negative for joint swelling.  Skin: Negative for rash.  Neurological: Negative for headaches.  Hematological: Does not bruise/bleed easily.  Psychiatric/Behavioral: Negative for dysphoric mood. The patient is not nervous/anxious.     Past Medical History:  Diagnosis Date  . Hyperlipidemia   . Hypertension      Family History  Problem Relation Age of Onset  . Stroke Mother   . Cancer Father        esophageal     Social History   Socioeconomic History  . Marital status: Single    Spouse name: Not on file  . Number of children: Not on file  . Years of education: Not on file  . Highest education level: Not on file  Social Needs  . Financial resource strain: Not  on file  . Food insecurity - worry: Not on file  . Food insecurity - inability: Not on file  . Transportation needs - medical: Not on file  . Transportation needs - non-medical: Not on file  Occupational History  . Not on file  Tobacco Use  . Smoking status: Former Smoker    Packs/day: 1.50    Years: 50.00    Pack years: 75.00    Types: Cigarettes    Last attempt to quit: 08/20/2011    Years since quitting: 6.2  . Smokeless  tobacco: Never Used  Substance and Sexual Activity  . Alcohol use: Yes    Comment: 1beer a day  . Drug use: No  . Sexual activity: Not on file  Other Topics Concern  . Not on file  Social History Narrative  . Not on file  Has worked Therapist, music, Garment/textile technologist No fumes, powders No [employer]  [place] native.   No Known Allergies   Outpatient Medications Prior to Visit  Medication Sig Dispense Refill  . albuterol (PROVENTIL HFA;VENTOLIN HFA) 108 (90 Base) MCG/ACT inhaler Inhale 2 puffs into the lungs every 4 (four) hours as needed for wheezing or shortness of breath. 1 Inhaler 6  . amitriptyline (ELAVIL) 75 MG tablet Take 75 mg by mouth at bedtime.    Marland Kitchen atorvastatin (LIPITOR) 10 MG tablet Take 10 mg by mouth daily.    Marland Kitchen lisinopril (PRINIVIL,ZESTRIL) 10 MG tablet Take 10 mg by mouth daily.     Marland Kitchen zolpidem (AMBIEN) 10 MG tablet Take 1 tablet (10 mg total) by mouth at bedtime as needed for sleep. 14 tablet 0   No facility-administered medications prior to visit.         Objective:   Physical Exam Vitals:   11/29/17 1011 11/29/17 1013  BP:  116/80  Pulse:  (!) 119  SpO2:  97%  Weight: 158 lb (71.7 kg)   Height: 5\' 8"  (1.727 m)    Gen: Pleasant, well-nourished, in no distress,  normal affect  ENT: No lesions,  mouth clear,  oropharynx clear, no postnasal drip  Neck: No JVD, no stridor  Lungs: No use of accessory muscles, clear during a normal respiratory cycle, he does wheeze with forced expiration  Cardiovascular: RRR, heart sounds normal, no murmur or gallops, no peripheral edema  Musculoskeletal: No deformities, no cyanosis or clubbing  Neuro: alert, non focal  Skin: Warm, no lesions or rashes     Assessment & Plan:  COPD (chronic obstructive pulmonary disease) (HCC) Clinical history and pulmonary function testing consistent with COPD.  He believes that he benefits some from albuterol although he has not tried it very frequently.  I believe that he would benefit from  schedule bronchodilator.  We will start Spiriva once daily.  Continue albuterol as needed.  Multiple pulmonary nodules PSA, PET scan consistent with metastatic prostate cancer to the lungs.  He is planning for a prostatic biopsy with Dr. Karsten Ro with urology.  This is scheduled for early January.  At his request I will speak with Dr. Karsten Ro, see if it would be possible or reasonable to move the test up sooner.  I tried to reassure Mr. Badeaux that there are options for treatment even in metastatic prostate cancer that would allow suppression of his disease and good quality of life.   Baltazar Apo, MD, PhD 11/29/2017, 10:40 AM [facility] [phone] or if no answer [phone]

## 2017-11-29 NOTE — Progress Notes (Signed)
PFT done today. 

## 2017-11-29 NOTE — Assessment & Plan Note (Signed)
PSA, PET scan consistent with metastatic prostate cancer to the lungs.  He is planning for a prostatic biopsy with Dr. Karsten Ro with urology.  This is scheduled for early January.  At his request I will speak with Dr. Karsten Ro, see if it would be possible or reasonable to move the test up sooner.  I tried to reassure Anthony Escobar that there are options for treatment even in metastatic prostate cancer that would allow suppression of his disease and good quality of life.

## 2017-12-08 ENCOUNTER — Telehealth: Payer: Self-pay | Admitting: Emergency Medicine

## 2017-12-08 NOTE — Telephone Encounter (Signed)
Called and spoke with pt and he is aware of the recs from [place].  Nothing further is needed.

## 2017-12-08 NOTE — Telephone Encounter (Signed)
Called and spoke with pt and he stated that his hips feel like they are inflamed and he is having a hard time getting out of bed and walking around.  He stated that this feeling is radiating into his feet as well.  He is having a hard time sleeping.  He has used tylenol, advil and aleve and none of these have worked for him.  RB please advise.  Pt stated that he has not contacted his PCP about this yet.    No Known Allergies

## 2017-12-08 NOTE — Telephone Encounter (Signed)
He has tried the medications that I would recommend given the kind of discomfort that he is experiencing.  I agree that he needs to talk with his primary care physician to decide whether there is safe medication that is stronger.

## 2018-01-02 ENCOUNTER — Telehealth: Payer: Self-pay | Admitting: Emergency Medicine

## 2018-01-02 NOTE — Telephone Encounter (Signed)
Patient came and dropped of cancer policy forms to be completed - sent to [facility] via interoffice mail -pr

## 2018-01-06 ENCOUNTER — Telehealth: Payer: Self-pay | Admitting: Oncology

## 2018-01-06 NOTE — Telephone Encounter (Signed)
Spoke with patient regarding appointment D/T/Loc/Ph#.Referring MD also notified with

## 2018-01-11 NOTE — Telephone Encounter (Signed)
Rec'd critical illness forms back from [facility] - fwd to [place] for RB to complete -pr

## 2018-01-19 ENCOUNTER — Encounter: Payer: Self-pay | Admitting: Medical Oncology

## 2018-01-19 ENCOUNTER — Telehealth: Payer: Self-pay | Admitting: Oncology

## 2018-01-19 ENCOUNTER — Inpatient Hospital Stay: Payer: Medicare Other | Attending: Oncology | Admitting: Oncology

## 2018-01-19 VITALS — BP 140/90 | HR 104 | Temp 97.9°F | Resp 18 | Ht 68.0 in | Wt 153.8 lb

## 2018-01-19 DIAGNOSIS — Z79899 Other long term (current) drug therapy: Secondary | ICD-10-CM

## 2018-01-19 DIAGNOSIS — R102 Pelvic and perineal pain: Secondary | ICD-10-CM | POA: Diagnosis not present

## 2018-01-19 DIAGNOSIS — C7951 Secondary malignant neoplasm of bone: Secondary | ICD-10-CM | POA: Diagnosis not present

## 2018-01-19 DIAGNOSIS — Z7189 Other specified counseling: Secondary | ICD-10-CM

## 2018-01-19 DIAGNOSIS — C61 Malignant neoplasm of prostate: Secondary | ICD-10-CM | POA: Diagnosis not present

## 2018-01-19 DIAGNOSIS — C7800 Secondary malignant neoplasm of unspecified lung: Secondary | ICD-10-CM | POA: Diagnosis not present

## 2018-01-19 MED ORDER — PROCHLORPERAZINE MALEATE 10 MG PO TABS: 10.0000 mg | ORAL_TABLET | Freq: Four times a day (QID) | ORAL | 0 refills | Status: DC | PRN

## 2018-01-19 MED ORDER — OXYCODONE HCL 5 MG PO TABS: 5.0000 mg | ORAL_TABLET | ORAL | 0 refills | Status: DC | PRN

## 2018-01-19 NOTE — Progress Notes (Signed)
Introduced myself to Mr. Thielke and his granddaughter as the prostate nurse navigator and my role. He states that he had a chest x-ray in October that led him to a bronch, prostate biopsy and a diagnosis of cancer. He has been having hip pain and though he had sciatica but learned it is cancer. Dr.Ottelin prescribed hydrocodone for pain but it is not working well. He and his granddaughter are tearful and anxious. He states he is overwhelmed and worried about treatment today. I informed that Dr.Shadad will discuss his diagnosis and treatment plans but no treatments today. This helped to reduce his anxiety. I did discuss prostate support group and our support staff to help them. I will continue to follow and asked them to call with questions or concerns.

## 2018-01-19 NOTE — Telephone Encounter (Signed)
Gave avs and calendar for February and march °

## 2018-01-19 NOTE — Progress Notes (Signed)
START ON PATHWAY REGIMEN - Prostate   Docetaxel 75 mg/m2:   A cycle is every 21 days:     Docetaxel   **Always [facility] Bicalutamide:   A cycle is every 12 weeks:     Leuprolide acetate depot    Daily:     Bicalutamide   **Always confirm dose/schedule in your pharmacy ordering system**    Patient Characteristics: Adenocarcinoma, Metastatic, Hormone Naive, High Volume Disease* Current radiographic evidence of distant metastasis<= Yes Histology: Adenocarcinoma AJCC T Category: cTX Gleason Primary: X AJCC N Category: NX Gleason Secondary: X AJCC M Category: M1c Gleason Score: X AJCC 8 Stage Grouping: IVB PSA Values (ng/mL): X Would you be surprised if this patient died  in the next year<= I would be surprised if this patient died in the next year Intent of Therapy: Non-Curative / Palliative Intent, Discussed with Patient

## 2018-01-19 NOTE — Progress Notes (Signed)
Reason for Referral: Prostate cancer.  HPI: 67 year old gentleman currently in [place] where he lived the majority of his life.  He has history of hypertension and hyperlipidemia but for the most part in reasonable health.  He started developing symptoms of shortness of breath and cough in October 2018.  His evaluation included a chest x-ray and subsequently a CT scan of the chest obtained on October 20, 2017 which showed the diffuse bilateral pulmonary nodules suspicious for metastasis.  He was evaluated by Dr. Lamonte Sakai and a PET/CT scan obtained on 11/05/2017 showed marked uptake in his prostate as well as increased metabolic activity in the bilateral pulmonary metastasis as well as the skeletal metastasis including the pelvis.  Based on these findings, he was referred to Dr. Karsten Ro and underwent a prostate biopsy on December 29, 2017 showed a Gleason score 4+4 = 8 and 5 cores and Gleason score 4+3 = 7 and 3 cores.  Based on these findings, he was started on Firmagon anticipate starting Lupron in the near future.  Clinically, he reports continuous pain in his right hip and upper femur.  His pain is sharp and mostly exacerbated at nighttime.  Not associated with any weakness or neurological deficits.  He is able to bear weight and had not had any falls or syncope.  He has been taken hydrocodone which have helped his symptoms some although not as much.  He does take also ibuprofen at times.  His appetite has been affected as well and have had about 15 pound weight loss.  He is urination is adequate and moves his bowels regularly.   He does not report any headaches, blurry vision, syncope or seizures. Does not report any fevers, chills or sweats.  Does not report any cough, wheezing or hemoptysis.  Does not report any chest pain, palpitation, orthopnea or leg edema.  Does not report any nausea, vomiting or abdominal pain.  She does not report any constipation or diarrhea.  Does not report any skeletal  complaints.    Does not report frequency, urgency or hematuria.  Does not report any skin rashes or lesions. Does not report any heat or cold intolerance.  Does not report any lymphadenopathy or petechiae.  Does not report any anxiety or depression.  Remaining review of systems is negative.      Past Medical History:  Diagnosis Date  . Hyperlipidemia   . Hypertension   :  Past Surgical History:  Procedure Laterality Date  . HERNIA REPAIR  1988   bilateral inguinal  :   Current Outpatient Medications:  .  albuterol (PROVENTIL HFA;VENTOLIN HFA) 108 (90 Base) MCG/ACT inhaler, Inhale 2 puffs into the lungs every 4 (four) hours as needed for wheezing or shortness of breath., Disp: 1 Inhaler, Rfl: 6 .  amitriptyline (ELAVIL) 75 MG tablet, Take 75 mg by mouth at bedtime., Disp: , Rfl:  .  atorvastatin (LIPITOR) 10 MG tablet, Take 10 mg by mouth daily., Disp: , Rfl:  .  lisinopril (PRINIVIL,ZESTRIL) 10 MG tablet, Take 10 mg by mouth daily. , Disp: , Rfl:  .  tiotropium (SPIRIVA) 18 MCG inhalation capsule, Place 1 capsule (18 mcg total) into inhaler and inhale daily., Disp: 30 capsule, Rfl: 5 .  oxyCODONE (OXY IR/ROXICODONE) 5 MG immediate release tablet, Take 1 tablet (5 mg total) by mouth every 4 (four) hours as needed for severe pain., Disp: 30 tablet, Rfl: 0 .  prochlorperazine (COMPAZINE) 10 MG tablet, Take 1 tablet (10 mg total) by mouth every 6 (  six) hours as needed for nausea or vomiting., Disp: 30 tablet, Rfl: 0 .  zolpidem (AMBIEN) 10 MG tablet, Take 1 tablet (10 mg total) by mouth at bedtime as needed for sleep., Disp: 14 tablet, Rfl: 0:  No Known Allergies:  Family History  Problem Relation Age of Onset  . Stroke Mother   . Cancer Father        esophageal  :  Social History   Socioeconomic History  . Marital status: Single    Spouse name: Not on file  . Number of children: Not on file  . Years of education: Not on file  . Highest education level: Not on file  Social  Needs  . Financial resource strain: Not on file  . Food insecurity - worry: Not on file  . Food insecurity - inability: Not on file  . Transportation needs - medical: Not on file  . Transportation needs - non-medical: Not on file  Occupational History  . Not on file  Tobacco Use  . Smoking status: Former Smoker    Packs/day: 1.50    Years: 50.00    Pack years: 75.00    Types: Cigarettes    Last attempt to quit: 08/20/2011    Years since quitting: 6.4  . Smokeless tobacco: Never Used  Substance and Sexual Activity  . Alcohol use: Yes    Comment: 1beer a day  . Drug use: No  . Sexual activity: Not on file  Other Topics Concern  . Not on file  Social History Narrative  . Not on file  :  Pertinent items are noted in HPI.  Exam: Blood pressure 140/90, pulse (!) 104, temperature 97.9 F (36.6 C), temperature source Oral, resp. rate 18, height 5\' 8"  (1.727 m), weight 153 lb 12.8 oz (69.8 kg), SpO2 98 %. ECOG 0 General appearance: alert and cooperative appeared without distress. Eyes: conjunctivae/corneas clear. PERRL.  Throat: lips, mucosa, and tongue normal; teeth and gums normal Resp: clear to auscultation bilaterally without wheezes or dullness to percussion. Cardio: regular rate and rhythm, S1, S2 normal, no murmur, click, rub or gallop GI: soft, non-tender; bowel sounds normal; no masses,  no organomegaly Musculoskeletal: He has full range of motion in all joints.  No point tenderness or effusion noted on his right hip. Skin: Skin color, texture, turgor normal. No rashes or lesions Lymph nodes: Cervical, supraclavicular, and axillary nodes normal. Neurologic: Grossly normal  Psychiatric: Appropriate mood and affect.  Although he was emotional to learn about his diagnosis.  CBC    Component Value Date/Time   WBC 8.0 05/23/2013 1422   RBC 4.20 (L) 05/23/2013 1422   HGB 13.6 05/23/2013 1422   HCT 40.4 05/23/2013 1422   PLT 364 05/23/2013 1422   MCV 96.2 05/23/2013 1422    MCH 32.4 05/23/2013 1422   MCHC 33.7 05/23/2013 1422   RDW 13.4 05/23/2013 1422   LYMPHSABS 1.7 05/23/2013 1422   MONOABS 0.7 05/23/2013 1422   EOSABS 0.1 05/23/2013 1422   BASOSABS 0.0 05/23/2013 1422     Chemistry      Component Value Date/Time   NA 137 10/19/2017 0725   K 5.0 10/19/2017 0725   CL 102 10/19/2017 0725   CO2 27 10/19/2017 0725   BUN 19 10/19/2017 0725   CREATININE 1.08 10/19/2017 0725   CREATININE 0.88 05/23/2013 1422      Component Value Date/Time   CALCIUM 9.5 10/19/2017 0725       Assessment and Plan:   67 year old  gentleman with the following issues:  1.  Prostate cancer: Diagnosed in January 2019.  He presented with pulmonary metastasis as well as right bony metastasis.  PET/CT scan obtained in November 2018 showed FDG uptake in those areas.  Prostate biopsy confirmed the diagnosis of Gleason score 4+4 = 8 prostate cancer.  His PSA was 19.57.  He was started on androgen deprivation in the form of [place] Koller and will start Lupron in the near future under the care of Dr. Karsten Ro.  The natural course of prostate cancer was discussed today with the patient and his granddaughter.  He has advanced disease and understands that he has an incurable malignancy.  This is a cancer that can be palliated appropriately for a period of time.  The backbone of treating advanced prostate cancer is androgen deprivation and he is already started.  The rationale for using Taxotere chemotherapy in addition to androgen deprivation was discussed.  The benefit would include extending overall survival and progression free survival by adding 6 cycles of Taxotere chemotherapy to androgen deprivation.  Complication associated with this therapy including nausea, vomiting, myelosuppression, neutropenia and rarely neutropenic sepsis and need for hospitalization.  Peripheral neuropathy as well as infusion related complications are also noted.  After discussion today he is agreeable to proceed  after chemotherapy education class.  2.  IV access: His peripheral veins will be used in a Port-A-Cath can be considered if IV access becomes an issue.  3.  Antiemetics: Prescription for Compazine was given to the patient today.  4.  Neutropenia prophylaxis: He will receive Neulasta after each cycle of chemotherapy.  5.  Pelvic pain: I will obtain an MRI of the right hip and consider radiation therapy if pain becomes an issue.  I gave him a prescription for oxycodone with instructions how to use it and alternate with ibuprofen.  6.  Bone directed therapy: Delton See will be considered in the future.  7.  Prognosis: He has an incurable malignancy but has an excellent performance status.  Aggressive therapy is warranted and his cancer can be palliated for an extended period of time.  8.  Follow-up: We will be in the next few weeks to start chemotherapy.  60  minutes was spent with the patient face-to-face today.  More than 50% of time was dedicated to patient counseling, education and coordination of his multifaceted care.

## 2018-01-26 ENCOUNTER — Inpatient Hospital Stay: Payer: Medicare Other

## 2018-01-26 ENCOUNTER — Encounter: Payer: Self-pay | Admitting: *Deleted

## 2018-01-26 NOTE — Telephone Encounter (Signed)
Rec'd completed forms. Fwd to [facility] via interoffice mail -pr

## 2018-02-02 ENCOUNTER — Other Ambulatory Visit: Payer: Self-pay | Admitting: *Deleted

## 2018-02-02 ENCOUNTER — Encounter: Payer: Self-pay | Admitting: Oncology

## 2018-02-02 ENCOUNTER — Inpatient Hospital Stay: Payer: PRIVATE HEALTH INSURANCE

## 2018-02-02 ENCOUNTER — Inpatient Hospital Stay: Payer: PRIVATE HEALTH INSURANCE | Attending: Oncology

## 2018-02-02 VITALS — BP 170/90 | HR 79 | Temp 97.8°F | Resp 18

## 2018-02-02 DIAGNOSIS — Z5111 Encounter for antineoplastic chemotherapy: Secondary | ICD-10-CM | POA: Diagnosis present

## 2018-02-02 DIAGNOSIS — Z5189 Encounter for other specified aftercare: Secondary | ICD-10-CM | POA: Diagnosis not present

## 2018-02-02 DIAGNOSIS — C61 Malignant neoplasm of prostate: Secondary | ICD-10-CM

## 2018-02-02 LAB — CBC WITH DIFFERENTIAL (CANCER CENTER ONLY)
BASOS ABS: 0.1 10*3/uL (ref 0.0–0.1)
BASOS PCT: 1 %
Eosinophils Absolute: 0.1 10*3/uL (ref 0.0–0.5)
Eosinophils Relative: 1 %
HEMATOCRIT: 36.9 % — AB (ref 38.4–49.9)
Hemoglobin: 12.7 g/dL — ABNORMAL LOW (ref 13.0–17.1)
Lymphocytes Relative: 17 %
Lymphs Abs: 1.5 10*3/uL (ref 0.9–3.3)
MCH: 33.6 pg — ABNORMAL HIGH (ref 27.2–33.4)
MCHC: 34.4 g/dL (ref 32.0–36.0)
MCV: 97.7 fL (ref 79.3–98.0)
MONO ABS: 0.7 10*3/uL (ref 0.1–0.9)
Monocytes Relative: 9 %
NEUTROS ABS: 6.3 10*3/uL (ref 1.5–6.5)
Neutrophils Relative %: 72 %
PLATELETS: 488 10*3/uL — AB (ref 140–400)
RBC: 3.78 MIL/uL — ABNORMAL LOW (ref 4.20–5.82)
RDW: 13 % (ref 11.0–14.6)
WBC Count: 8.7 10*3/uL (ref 4.0–10.3)

## 2018-02-02 LAB — CMP (CANCER CENTER ONLY)
ALBUMIN: 3.7 g/dL (ref 3.5–5.0)
ALT: 13 U/L (ref 0–55)
ANION GAP: 10 (ref 3–11)
AST: 19 U/L (ref 5–34)
Alkaline Phosphatase: 154 U/L — ABNORMAL HIGH (ref 40–150)
BILIRUBIN TOTAL: 0.3 mg/dL (ref 0.2–1.2)
BUN: 14 mg/dL (ref 7–26)
CALCIUM: 9.3 mg/dL (ref 8.4–10.4)
CO2: 24 mmol/L (ref 22–29)
Chloride: 104 mmol/L (ref 98–109)
Creatinine: 0.88 mg/dL (ref 0.70–1.30)
Glucose, Bld: 88 mg/dL (ref 70–140)
Potassium: 4.4 mmol/L (ref 3.5–5.1)
Sodium: 138 mmol/L (ref 136–145)
TOTAL PROTEIN: 7.1 g/dL (ref 6.4–8.3)

## 2018-02-02 MED ORDER — SODIUM CHLORIDE 0.9 % IV SOLN
Freq: Once | INTRAVENOUS | Status: AC
  Administered 2018-02-02: 10:00:00 via INTRAVENOUS

## 2018-02-02 MED ORDER — DEXAMETHASONE SODIUM PHOSPHATE 10 MG/ML IJ SOLN
INTRAMUSCULAR | Status: AC
  Filled 2018-02-02: qty 1

## 2018-02-02 MED ORDER — DEXAMETHASONE SODIUM PHOSPHATE 10 MG/ML IJ SOLN
10.0000 mg | Freq: Once | INTRAMUSCULAR | Status: AC
  Administered 2018-02-02: 10 mg via INTRAVENOUS

## 2018-02-02 MED ORDER — SODIUM CHLORIDE 0.9 % IV SOLN
75.0000 mg/m2 | Freq: Once | INTRAVENOUS | Status: AC
  Administered 2018-02-02: 140 mg via INTRAVENOUS
  Filled 2018-02-02: qty 14

## 2018-02-02 NOTE — Patient Instructions (Addendum)
[facility] Discharge Instructions for Patients Receiving Chemotherapy  Today you received the following chemotherapy agents Taxotere  To help prevent nausea and vomiting after your treatment, we encourage you to take your nausea medication as directed   If you develop nausea and vomiting that is not controlled by your nausea medication, call the clinic.   BELOW ARE SYMPTOMS THAT SHOULD BE REPORTED IMMEDIATELY:  *FEVER GREATER THAN 100.5 F  *CHILLS WITH OR WITHOUT FEVER  NAUSEA AND VOMITING THAT IS NOT CONTROLLED WITH YOUR NAUSEA MEDICATION  *UNUSUAL SHORTNESS OF BREATH  *UNUSUAL BRUISING OR BLEEDING  TENDERNESS IN MOUTH AND THROAT WITH OR WITHOUT PRESENCE OF ULCERS  *URINARY PROBLEMS  *BOWEL PROBLEMS  UNUSUAL RASH Items with * indicate a potential emergency and should be followed up as soon as possible.  Feel free to call the clinic should you have any questions or concerns. The clinic phone number is [phone].  Please show the [facility] at check-in to the Emergency Department and triage nurse.   Docetaxel injection What is this medicine? DOCETAXEL (doe se TAX el) is a chemotherapy drug. It targets fast dividing cells, like cancer cells, and causes these cells to die. This medicine is used to treat many types of cancers like breast cancer, certain stomach cancers, head and neck cancer, lung cancer, and prostate cancer. This medicine may be used for other purposes; ask your health care provider or pharmacist if you have questions. COMMON BRAND NAME(S): Docefrez, Taxotere What should I tell my health care provider before I take this medicine? They need to know if you have any of these conditions: -infection (especially a virus infection such as chickenpox, cold sores, or herpes) -liver disease -low blood counts, like low white cell, platelet, or red cell counts -an unusual or allergic reaction to docetaxel, polysorbate 80, other chemotherapy  agents, other medicines, foods, dyes, or preservatives -pregnant or trying to get pregnant -breast-feeding How should I use this medicine? This drug is given as an infusion into a vein. It is administered in a hospital or clinic by a specially trained health care professional. Talk to your pediatrician regarding the use of this medicine in children. Special care may be needed. Overdosage: If you think you have taken too much of this medicine contact a poison control center or emergency room at once. NOTE: This medicine is only for you. Do not share this medicine with others. What if I miss a dose? It is important not to miss your dose. Call your doctor or health care professional if you are unable to keep an appointment. What may interact with this medicine? -cyclosporine -erythromycin -ketoconazole -medicines to increase blood counts like filgrastim, pegfilgrastim, sargramostim -vaccines Talk to your doctor or health care professional before taking any of these medicines: -acetaminophen -aspirin -ibuprofen -ketoprofen -naproxen This list may not describe all possible interactions. Give your health care provider a list of all the medicines, herbs, non-prescription drugs, or dietary supplements you use. Also tell them if you smoke, drink alcohol, or use illegal drugs. Some items may interact with your medicine. What should I watch for while using this medicine? Your condition will be monitored carefully while you are receiving this medicine. You will need important blood work done while you are taking this medicine. This drug may make you feel generally unwell. This is not uncommon, as chemotherapy can affect healthy cells as well as cancer cells. Report any side effects. Continue your course of treatment even though you feel ill  unless your doctor tells you to stop. In some cases, you may be given additional medicines to help with side effects. Follow all directions for their use. Call  your doctor or health care professional for advice if you get a fever, chills or sore throat, or other symptoms of a cold or flu. Do not treat yourself. This drug decreases your body's ability to fight infections. Try to avoid being around people who are sick. This medicine may increase your risk to bruise or bleed. Call your doctor or health care professional if you notice any unusual bleeding. This medicine may contain alcohol in the product. You may get drowsy or dizzy. Do not drive, use machinery, or do anything that needs mental alertness until you know how this medicine affects you. Do not stand or sit up quickly, especially if you are an older patient. This reduces the risk of dizzy or fainting spells. Avoid alcoholic drinks. Do not become pregnant while taking this medicine. Women should inform their doctor if they wish to become pregnant or think they might be pregnant. There is a potential for serious side effects to an unborn child. Talk to your health care professional or pharmacist for more information. Do not breast-feed an infant while taking this medicine. What side effects may I notice from receiving this medicine? Side effects that you should report to your doctor or health care professional as soon as possible: -allergic reactions like skin rash, itching or hives, swelling of the face, lips, or tongue -low blood counts - This drug may decrease the number of white blood cells, red blood cells and platelets. You may be at increased risk for infections and bleeding. -signs of infection - fever or chills, cough, sore throat, pain or difficulty passing urine -signs of decreased platelets or bleeding - bruising, pinpoint red spots on the skin, black, tarry stools, nosebleeds -signs of decreased red blood cells - unusually weak or tired, fainting spells, lightheadedness -breathing problems -fast or irregular heartbeat -low blood pressure -mouth sores -nausea and vomiting -pain, swelling,  redness or irritation at the injection site -pain, tingling, numbness in the hands or feet -swelling of the ankle, feet, hands -weight gain Side effects that usually do not require medical attention (report to your doctor or health care professional if they continue or are bothersome): -bone pain -complete hair loss including hair on your head, underarms, pubic hair, eyebrows, and eyelashes -diarrhea -excessive tearing -changes in the color of fingernails -loosening of the fingernails -nausea -muscle pain -red flush to skin -sweating -weak or tired This list may not describe all possible side effects. Call your doctor for medical advice about side effects. You may report side effects to FDA at 1-800-FDA-1088. Where should I keep my medicine? This drug is given in a hospital or clinic and will not be stored at home. NOTE: This sheet is a summary. It may not cover all possible information. If you have questions about this medicine, talk to your doctor, pharmacist, or health care provider.  2018 Elsevier/Gold Standard (2016-01-15 12:32:56)

## 2018-02-02 NOTE — Progress Notes (Signed)
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  Unfortunately there aren't any foundations offering copay assistance for his Dx so I offered the [employer].  Pt would like to apply so he will bring his proof of income on 02/03/18.  If approved I will go over how the grant works and give him an expense sheet as well as my card for any questions or concerns he may have in the future.

## 2018-02-03 ENCOUNTER — Encounter: Payer: Self-pay | Admitting: Oncology

## 2018-02-03 ENCOUNTER — Ambulatory Visit: Payer: Medicare Other

## 2018-02-03 ENCOUNTER — Inpatient Hospital Stay: Payer: PRIVATE HEALTH INSURANCE

## 2018-02-03 ENCOUNTER — Telehealth: Payer: Self-pay | Admitting: Oncology

## 2018-02-03 DIAGNOSIS — C61 Malignant neoplasm of prostate: Secondary | ICD-10-CM

## 2018-02-03 DIAGNOSIS — Z5189 Encounter for other specified aftercare: Secondary | ICD-10-CM | POA: Diagnosis not present

## 2018-02-03 MED ORDER — PEGFILGRASTIM INJECTION 6 MG/0.6ML ~~LOC~~
6.0000 mg | PREFILLED_SYRINGE | Freq: Once | SUBCUTANEOUS | Status: AC
  Administered 2018-02-03: 6 mg via SUBCUTANEOUS

## 2018-02-03 NOTE — Patient Instructions (Signed)
Pegfilgrastim injection What is this medicine? PEGFILGRASTIM (PEG fil gra stim) is a long-acting granulocyte colony-stimulating factor that stimulates the growth of neutrophils, a type of white blood cell important in the body's fight against infection. It is used to reduce the incidence of fever and infection in patients with certain types of cancer who are receiving chemotherapy that affects the bone marrow, and to increase survival after being exposed to high doses of radiation. This medicine may be used for other purposes; ask your health care provider or pharmacist if you have questions. COMMON BRAND NAME(S): Neulasta What should I tell my health care provider before I take this medicine? They need to know if you have any of these conditions: -kidney disease -latex allergy -ongoing radiation therapy -sickle cell disease -skin reactions to acrylic adhesives (On-Body Injector only) -an unusual or allergic reaction to pegfilgrastim, filgrastim, other medicines, foods, dyes, or preservatives -pregnant or trying to get pregnant -breast-feeding How should I use this medicine? This medicine is for injection under the skin. If you get this medicine at home, you will be taught how to prepare and give the pre-filled syringe or how to use the On-body Injector. Refer to the patient Instructions for Use for detailed instructions. Use exactly as directed. Tell your healthcare provider immediately if you suspect that the On-body Injector may not have performed as intended or if you suspect the use of the On-body Injector resulted in a missed or partial dose. It is important that you put your used needles and syringes in a special sharps container. Do not put them in a trash can. If you do not have a sharps container, call your pharmacist or healthcare provider to get one. Talk to your pediatrician regarding the use of this medicine in children. While this drug may be prescribed for selected conditions,  precautions do apply. Overdosage: If you think you have taken too much of this medicine contact a poison control center or emergency room at once. NOTE: This medicine is only for you. Do not share this medicine with others. What if I miss a dose? It is important not to miss your dose. Call your doctor or health care professional if you miss your dose. If you miss a dose due to an On-body Injector failure or leakage, a new dose should be administered as soon as possible using a single prefilled syringe for manual use. What may interact with this medicine? Interactions have not been studied. Give your health care provider a list of all the medicines, herbs, non-prescription drugs, or dietary supplements you use. Also tell them if you smoke, drink alcohol, or use illegal drugs. Some items may interact with your medicine. This list may not describe all possible interactions. Give your health care provider a list of all the medicines, herbs, non-prescription drugs, or dietary supplements you use. Also tell them if you smoke, drink alcohol, or use illegal drugs. Some items may interact with your medicine. What should I watch for while using this medicine? You may need blood work done while you are taking this medicine. If you are going to need a MRI, CT scan, or other procedure, tell your doctor that you are using this medicine (On-Body Injector only). What side effects may I notice from receiving this medicine? Side effects that you should report to your doctor or health care professional as soon as possible: -allergic reactions like skin rash, itching or hives, swelling of the face, lips, or tongue -dizziness -fever -pain, redness, or irritation at site   where injected -pinpoint red spots on the skin -red or dark-brown urine -shortness of breath or breathing problems -stomach or side pain, or pain at the shoulder -swelling -tiredness -trouble passing urine or change in the amount of urine Side  effects that usually do not require medical attention (report to your doctor or health care professional if they continue or are bothersome): -bone pain -muscle pain This list may not describe all possible side effects. Call your doctor for medical advice about side effects. You may report side effects to FDA at 1-800-FDA-1088. Where should I keep my medicine? Keep out of the reach of children. Store pre-filled syringes in a refrigerator between 2 and 8 degrees C (36 and 46 degrees F). Do not freeze. Keep in carton to protect from light. Throw away this medicine if it is left out of the refrigerator for more than 48 hours. Throw away any unused medicine after the expiration date. NOTE: This sheet is a summary. It may not cover all possible information. If you have questions about this medicine, talk to your doctor, pharmacist, or health care provider.  2018 Elsevier/Gold Standard (2016-12-09 12:58:03)  

## 2018-02-03 NOTE — Telephone Encounter (Signed)
Per [facility] nurse had to move appointment from 11:45 to 1:00 for today .  Patient notified

## 2018-02-03 NOTE — Progress Notes (Signed)
Pt is approved for the $400 [facility] and $200 prostate grants.

## 2018-02-22 ENCOUNTER — Other Ambulatory Visit: Payer: Self-pay | Admitting: Oncology

## 2018-02-22 DIAGNOSIS — C61 Malignant neoplasm of prostate: Secondary | ICD-10-CM

## 2018-02-23 ENCOUNTER — Inpatient Hospital Stay: Payer: PRIVATE HEALTH INSURANCE

## 2018-02-23 ENCOUNTER — Inpatient Hospital Stay (HOSPITAL_BASED_OUTPATIENT_CLINIC_OR_DEPARTMENT_OTHER): Payer: PRIVATE HEALTH INSURANCE | Admitting: Oncology

## 2018-02-23 ENCOUNTER — Encounter: Payer: Self-pay | Admitting: Medical Oncology

## 2018-02-23 VITALS — BP 117/73 | HR 110 | Temp 98.0°F | Resp 18 | Ht 68.0 in | Wt 159.9 lb

## 2018-02-23 DIAGNOSIS — C61 Malignant neoplasm of prostate: Secondary | ICD-10-CM

## 2018-02-23 DIAGNOSIS — C7800 Secondary malignant neoplasm of unspecified lung: Secondary | ICD-10-CM

## 2018-02-23 DIAGNOSIS — R102 Pelvic and perineal pain: Secondary | ICD-10-CM | POA: Diagnosis not present

## 2018-02-23 DIAGNOSIS — Z5189 Encounter for other specified aftercare: Secondary | ICD-10-CM | POA: Diagnosis not present

## 2018-02-23 DIAGNOSIS — C7951 Secondary malignant neoplasm of bone: Secondary | ICD-10-CM

## 2018-02-23 DIAGNOSIS — Z79899 Other long term (current) drug therapy: Secondary | ICD-10-CM

## 2018-02-23 LAB — CBC WITH DIFFERENTIAL (CANCER CENTER ONLY)
BASOS ABS: 0 10*3/uL (ref 0.0–0.1)
BASOS PCT: 0 %
Eosinophils Absolute: 0 10*3/uL (ref 0.0–0.5)
Eosinophils Relative: 0 %
HEMATOCRIT: 37.4 % — AB (ref 38.4–49.9)
HEMOGLOBIN: 12.2 g/dL — AB (ref 13.0–17.1)
LYMPHS PCT: 18 %
Lymphs Abs: 1.8 10*3/uL (ref 0.9–3.3)
MCH: 32.4 pg (ref 27.2–33.4)
MCHC: 32.6 g/dL (ref 32.0–36.0)
MCV: 99.2 fL — ABNORMAL HIGH (ref 79.3–98.0)
Monocytes Absolute: 1.1 10*3/uL — ABNORMAL HIGH (ref 0.1–0.9)
Monocytes Relative: 12 %
NEUTROS ABS: 6.9 10*3/uL — AB (ref 1.5–6.5)
NEUTROS PCT: 70 %
Platelet Count: 567 10*3/uL — ABNORMAL HIGH (ref 140–400)
RBC: 3.77 MIL/uL — AB (ref 4.20–5.82)
RDW: 12.9 % (ref 11.0–14.6)
WBC: 9.8 10*3/uL (ref 4.0–10.3)

## 2018-02-23 LAB — CMP (CANCER CENTER ONLY)
ALBUMIN: 3.7 g/dL (ref 3.5–5.0)
ALT: 20 U/L (ref 0–55)
AST: 19 U/L (ref 5–34)
Alkaline Phosphatase: 90 U/L (ref 40–150)
Anion gap: 11 (ref 3–11)
BILIRUBIN TOTAL: 0.3 mg/dL (ref 0.2–1.2)
BUN: 14 mg/dL (ref 7–26)
CO2: 27 mmol/L (ref 22–29)
Calcium: 9.6 mg/dL (ref 8.4–10.4)
Chloride: 100 mmol/L (ref 98–109)
Creatinine: 0.9 mg/dL (ref 0.70–1.30)
GFR, Est AFR Am: >60 mL/min (ref >60)
GLUCOSE: 90 mg/dL (ref 70–140)
POTASSIUM: 4.1 mmol/L (ref 3.5–5.1)
Sodium: 138 mmol/L (ref 136–145)
TOTAL PROTEIN: 7.2 g/dL (ref 6.4–8.3)

## 2018-02-23 MED ORDER — DEXAMETHASONE SODIUM PHOSPHATE 10 MG/ML IJ SOLN
10.0000 mg | Freq: Once | INTRAMUSCULAR | Status: AC
  Administered 2018-02-23: 10 mg via INTRAVENOUS

## 2018-02-23 MED ORDER — DOCETAXEL CHEMO INJECTION 160 MG/16ML
75.0000 mg/m2 | Freq: Once | INTRAVENOUS | Status: AC
  Administered 2018-02-23: 140 mg via INTRAVENOUS
  Filled 2018-02-23: qty 14

## 2018-02-23 MED ORDER — SODIUM CHLORIDE 0.9 % IV SOLN
Freq: Once | INTRAVENOUS | Status: AC
  Administered 2018-02-23: 10:00:00 via INTRAVENOUS

## 2018-02-23 MED ORDER — DEXAMETHASONE SODIUM PHOSPHATE 10 MG/ML IJ SOLN
INTRAMUSCULAR | Status: AC
  Filled 2018-02-23: qty 1

## 2018-02-23 NOTE — Patient Instructions (Signed)
[facility] Discharge Instructions for Patients Receiving Chemotherapy  Today you received the following chemotherapy agent: Taxotere  To help prevent nausea and vomiting after your treatment, we encourage you to take your nausea medication as directed   If you develop nausea and vomiting that is not controlled by your nausea medication, call the clinic.   BELOW ARE SYMPTOMS THAT SHOULD BE REPORTED IMMEDIATELY:  *FEVER GREATER THAN 100.5 F  *CHILLS WITH OR WITHOUT FEVER  NAUSEA AND VOMITING THAT IS NOT CONTROLLED WITH YOUR NAUSEA MEDICATION  *UNUSUAL SHORTNESS OF BREATH  *UNUSUAL BRUISING OR BLEEDING  TENDERNESS IN MOUTH AND THROAT WITH OR WITHOUT PRESENCE OF ULCERS  *URINARY PROBLEMS  *BOWEL PROBLEMS  UNUSUAL RASH Items with * indicate a potential emergency and should be followed up as soon as possible.  Feel free to call the clinic should you have any questions or concerns. The clinic phone number is [phone].  Please show the [facility] at check-in to the Emergency Department and triage nurse.   Docetaxel injection What is this medicine? DOCETAXEL (doe se TAX el) is a chemotherapy drug. It targets fast dividing cells, like cancer cells, and causes these cells to die. This medicine is used to treat many types of cancers like breast cancer, certain stomach cancers, head and neck cancer, lung cancer, and prostate cancer. This medicine may be used for other purposes; ask your health care provider or pharmacist if you have questions. COMMON BRAND NAME(S): Docefrez, Taxotere What should I tell my health care provider before I take this medicine? They need to know if you have any of these conditions: -infection (especially a virus infection such as chickenpox, cold sores, or herpes) -liver disease -low blood counts, like low white cell, platelet, or red cell counts -an unusual or allergic reaction to docetaxel, polysorbate 80, other chemotherapy  agents, other medicines, foods, dyes, or preservatives -pregnant or trying to get pregnant -breast-feeding How should I use this medicine? This drug is given as an infusion into a vein. It is administered in a hospital or clinic by a specially trained health care professional. Talk to your pediatrician regarding the use of this medicine in children. Special care may be needed. Overdosage: If you think you have taken too much of this medicine contact a poison control center or emergency room at once. NOTE: This medicine is only for you. Do not share this medicine with others. What if I miss a dose? It is important not to miss your dose. Call your doctor or health care professional if you are unable to keep an appointment. What may interact with this medicine? -cyclosporine -erythromycin -ketoconazole -medicines to increase blood counts like filgrastim, pegfilgrastim, sargramostim -vaccines Talk to your doctor or health care professional before taking any of these medicines: -acetaminophen -aspirin -ibuprofen -ketoprofen -naproxen This list may not describe all possible interactions. Give your health care provider a list of all the medicines, herbs, non-prescription drugs, or dietary supplements you use. Also tell them if you smoke, drink alcohol, or use illegal drugs. Some items may interact with your medicine. What should I watch for while using this medicine? Your condition will be monitored carefully while you are receiving this medicine. You will need important blood work done while you are taking this medicine. This drug may make you feel generally unwell. This is not uncommon, as chemotherapy can affect healthy cells as well as cancer cells. Report any side effects. Continue your course of treatment even though you feel ill  unless your doctor tells you to stop. In some cases, you may be given additional medicines to help with side effects. Follow all directions for their use. Call  your doctor or health care professional for advice if you get a fever, chills or sore throat, or other symptoms of a cold or flu. Do not treat yourself. This drug decreases your body's ability to fight infections. Try to avoid being around people who are sick. This medicine may increase your risk to bruise or bleed. Call your doctor or health care professional if you notice any unusual bleeding. This medicine may contain alcohol in the product. You may get drowsy or dizzy. Do not drive, use machinery, or do anything that needs mental alertness until you know how this medicine affects you. Do not stand or sit up quickly, especially if you are an older patient. This reduces the risk of dizzy or fainting spells. Avoid alcoholic drinks. Do not become pregnant while taking this medicine. Women should inform their doctor if they wish to become pregnant or think they might be pregnant. There is a potential for serious side effects to an unborn child. Talk to your health care professional or pharmacist for more information. Do not breast-feed an infant while taking this medicine. What side effects may I notice from receiving this medicine? Side effects that you should report to your doctor or health care professional as soon as possible: -allergic reactions like skin rash, itching or hives, swelling of the face, lips, or tongue -low blood counts - This drug may decrease the number of white blood cells, red blood cells and platelets. You may be at increased risk for infections and bleeding. -signs of infection - fever or chills, cough, sore throat, pain or difficulty passing urine -signs of decreased platelets or bleeding - bruising, pinpoint red spots on the skin, black, tarry stools, nosebleeds -signs of decreased red blood cells - unusually weak or tired, fainting spells, lightheadedness -breathing problems -fast or irregular heartbeat -low blood pressure -mouth sores -nausea and vomiting -pain, swelling,  redness or irritation at the injection site -pain, tingling, numbness in the hands or feet -swelling of the ankle, feet, hands -weight gain Side effects that usually do not require medical attention (report to your doctor or health care professional if they continue or are bothersome): -bone pain -complete hair loss including hair on your head, underarms, pubic hair, eyebrows, and eyelashes -diarrhea -excessive tearing -changes in the color of fingernails -loosening of the fingernails -nausea -muscle pain -red flush to skin -sweating -weak or tired This list may not describe all possible side effects. Call your doctor for medical advice about side effects. You may report side effects to FDA at 1-800-FDA-1088. Where should I keep my medicine? This drug is given in a hospital or clinic and will not be stored at home. NOTE: This sheet is a summary. It may not cover all possible information. If you have questions about this medicine, talk to your doctor, pharmacist, or health care provider.  2018 Elsevier/Gold Standard (2016-01-15 12:32:56)

## 2018-02-23 NOTE — Progress Notes (Signed)
Hematology and Oncology Follow Up Visit  Anthony Escobar [id number] 1951/10/13 67 y.o. 02/23/2018 9:47 AM Anthony Escobar, NPSmothers, [name], NP   Principle Diagnosis: 67 year old gentleman advanced hormone sensitive prostate cancer diagnosed in January 2019.  He has pulmonary metastasis and biopsy-proven prostate cancer Gleason score 4+4 = 8 PSA of 19.57.   Prior Therapy:  He is status post prostate biopsy on December 29, 2017 showed a Gleason score 4+4 = 8 and 5 cores and Gleason score 4+3 = 7 as a tertiary pattern.  Current therapy:  Androgen deprivation therapy initially with Mills Koller subsequently with Lupron.  Taxotere chemotherapy with the first cycle of chemotherapy given on February 02, 2018.  Is here for cycle 2 anticipation to complete 6 cycles of therapy.  Interim History: Anthony Escobar presents today for a follow-up visit.  Since her last visit, he reports no recent complaints.  He tolerated the first cycle of chemotherapy without complications.  He denies any nausea, vomiting or nail changes.  He denies any peripheral neuropathy.  His appetite remained intact and his weight is stable.  He denies any infusion related complications or anaphylaxis.  He denies any respiratory symptoms including hemoptysis or hematemesis.  His quality of life remain excellent.    He does not report any headaches, blurry vision, syncope or seizures. Does not report any fevers, chills or sweats.  Does not report any cough, wheezing or hemoptysis.  Does not report any chest pain, palpitation, orthopnea or leg edema.  Does not report any nausea, vomiting or abdominal pain.  Does not report any constipation or diarrhea.  Does not report any skeletal complaints.    Does not report frequency, urgency or hematuria.  Does not report any skin rashes or lesions. Does not report any heat or cold intolerance.  Does not report any lymphadenopathy or petechiae.  Does not report any anxiety or depression.  Remaining  review of systems is negative.    Medications: I have reviewed the patient's current medications.  Current Outpatient Medications  Medication Sig Dispense Refill  . albuterol (PROVENTIL HFA;VENTOLIN HFA) 108 (90 Base) MCG/ACT inhaler Inhale 2 puffs into the lungs every 4 (four) hours as needed for wheezing or shortness of breath. 1 Inhaler 6  . amitriptyline (ELAVIL) 75 MG tablet Take 75 mg by mouth at bedtime.    Marland Kitchen atorvastatin (LIPITOR) 10 MG tablet Take 10 mg by mouth daily.    Marland Kitchen lisinopril (PRINIVIL,ZESTRIL) 10 MG tablet Take 10 mg by mouth daily.     Marland Kitchen oxyCODONE (OXY IR/ROXICODONE) 5 MG immediate release tablet Take 1 tablet (5 mg total) by mouth every 4 (four) hours as needed for severe pain. 30 tablet 0  . prochlorperazine (COMPAZINE) 10 MG tablet Take 1 tablet (10 mg total) by mouth every 6 (six) hours as needed for nausea or vomiting. 30 tablet 0  . tiotropium (SPIRIVA) 18 MCG inhalation capsule Place 1 capsule (18 mcg total) into inhaler and inhale daily. 30 capsule 5  . zolpidem (AMBIEN) 10 MG tablet Take 1 tablet (10 mg total) by mouth at bedtime as needed for sleep. 14 tablet 0   No current facility-administered medications for this visit.    Facility-Administered Medications Ordered in Other Visits  Medication Dose Route Frequency Provider Last Rate Last Dose  . 0.9 %  sodium chloride infusion   Intravenous Once [name], MD      . dexamethasone (DECADRON) injection 10 mg  10 mg Intravenous Once [name], MD      .  DOCEtaxel (TAXOTERE) 140 mg in sodium chloride 0.9 % 250 mL chemo infusion  75 mg/m2 (Treatment Plan Recorded) Intravenous Once [name], MD         Allergies: No Known Allergies  Past Medical History, Surgical history, Social history, and Family History were reviewed and updated.  Review of Systems:  Physical Exam: Blood pressure 117/73, pulse (!) 110, temperature 98 F (36.7 C), temperature source Oral, resp. rate 18, height 5\' 8"   (1.727 m), weight 159 lb 14.4 oz (72.5 kg), SpO2 98 %. ECOG: 0 General appearance: alert and cooperative appeared without distress. Head: Normocephalic, without obvious abnormality Oropharynx: No oral thrush or ulcers. Eyes: No scleral icterus.  Pupils are equal and round reactive to light. Lymph nodes: Cervical, supraclavicular, and axillary nodes normal. Heart:regular rate and rhythm, S1, S2 normal, no murmur, click, rub or gallop Lung:chest clear, no wheezing, rales, normal symmetric air entry Abdomin: soft, non-tender, without masses or organomegaly. Neurological: No motor, sensory deficits.  Intact deep tendon reflexes. Skin: No rashes or lesions.  No ecchymosis or petechiae. Musculoskeletal: No joint deformity or effusion. Psychiatric: Mood and affect are appropriate.    Lab Results: Lab Results  Component Value Date   WBC 9.8 02/23/2018   HGB 13.6 05/23/2013   HCT 37.4 (L) 02/23/2018   MCV 99.2 (H) 02/23/2018   PLT 567 (H) 02/23/2018     Chemistry      Component Value Date/Time   NA 138 02/23/2018 0832   K 4.1 02/23/2018 0832   CL 100 02/23/2018 0832   CO2 27 02/23/2018 0832   BUN 14 02/23/2018 0832   CREATININE 0.90 02/23/2018 0832   CREATININE 0.88 05/23/2013 1422      Component Value Date/Time   CALCIUM 9.6 02/23/2018 0832   ALKPHOS 90 02/23/2018 0832   AST 19 02/23/2018 0832   ALT 20 02/23/2018 0832   BILITOT 0.3 02/23/2018 0832       Impression and Plan:  67 year old man with the following issues:  1.    Advanced hormone sensitive prostate cancer with disease to the bone.  He was diagnosed in January 2019 with  PET/CT scan obtained in November 2018 showed FDG uptake in the lung as well as the prostate.  He is currently receiving androgen deprivation therapy and Taxotere chemotherapy was added.  He tolerated the first cycle without complications at this time.  The rationale and risks and benefits of continuing chemotherapy was reviewed again today.   Long-term complication associated with chemotherapy was reviewed which include nausea, fatigue, neuropathy as well as myelosuppression.  The benefit has been documented on multiple clinical trials including improvement in overall survival and metastatic free survival.  He is agreeable to proceed with cycle 2 without any dose reduction or delay.  2.  IV access: Risks and benefits of Port-A-Cath insertion was reviewed again today and he is agreeable to continue with chemotherapy using peripheral veins.  3.  Antiemetics: Compazine is available to the patient to use as needed for nausea.  No issues of nausea or vomiting.  4.  Neutropenia prophylaxis: Neulasta will be on hold based on patient's preference for cost issues.  We will reintroduce at he develops neutropenia.  5.  Pelvic pain: Much improved at this time with the start of therapy.  6.  Bone directed therapy: He is at risk of developing skeletal related events.  Delton See will be evaluated in the future after obtaining dental clearance.  7.  Prognosis: His performance status remain excellent and  aggressive treatment is warranted for his incurable malignancy.  8.  Follow-up: We will be i 3 weeks for the next cycle of chemotherapy.   25  minutes was spent with the patient face-to-face today.  More than 50% of time was dedicated to patient counseling, education and coordination of his care.     Zola Button, MD 2/28/20199:47 AM

## 2018-02-23 NOTE — Progress Notes (Signed)
Mr. Dec receiving cycle 2 of Taxotere. He states that he tolerated the first cycle without any major side effects which really surprised him. He is concerned about the cost of the Neulasta. He did speak with Lenise and was approved for the [facility] and prostate grants. He discussed with Dr. Alen Blew and currently Neulasta will be held this cycle.  He did mention that he has a wisdom tooth that was infected last month.He took antibiotics and his dentist feels it needs to be removed. I notified Dixie and she will discuss with Dr. Alen Blew.

## 2018-02-24 ENCOUNTER — Ambulatory Visit: Payer: Medicare Other

## 2018-02-24 LAB — PROSTATE-SPECIFIC AG, SERUM (LABCORP): Prostate Specific Ag, Serum: 1.6 ng/mL (ref 0.0–4.0)

## 2018-03-02 ENCOUNTER — Ambulatory Visit: Payer: Medicare Other | Admitting: Emergency Medicine

## 2018-03-14 ENCOUNTER — Ambulatory Visit: Payer: Medicare Other | Admitting: Emergency Medicine

## 2018-03-14 ENCOUNTER — Encounter: Payer: Self-pay | Admitting: Emergency Medicine

## 2018-03-14 DIAGNOSIS — C61 Malignant neoplasm of prostate: Secondary | ICD-10-CM | POA: Diagnosis not present

## 2018-03-14 DIAGNOSIS — J449 Chronic obstructive pulmonary disease, unspecified: Secondary | ICD-10-CM

## 2018-03-14 MED ORDER — ALBUTEROL SULFATE HFA 108 (90 BASE) MCG/ACT IN AERS: 2.0000 | INHALATION_SPRAY | RESPIRATORY_TRACT | 5 refills | Status: DC | PRN

## 2018-03-14 MED ORDER — TIOTROPIUM BROMIDE MONOHYDRATE 18 MCG IN CAPS: 18.0000 ug | ORAL_CAPSULE | Freq: Every day | RESPIRATORY_TRACT | 5 refills | Status: DC

## 2018-03-14 NOTE — Assessment & Plan Note (Signed)
We will try starting Spiriva 1 inhalation daily.  Take this medication every day for several weeks straight to see if it helps your overall breathing. We will prescribe albuterol for you to have available to use 2 puffs if needed for shortness of breath, wheezing, chest tightness. Follow with Dr Lamonte Sakai in 6 months or sooner if you have any problems

## 2018-03-14 NOTE — Assessment & Plan Note (Signed)
Continue to follow with Dr. Alen Blew and Dr. Karsten Ro.  Repeat chest imaging per their plans.

## 2018-03-14 NOTE — Patient Instructions (Signed)
We will try starting Spiriva 1 inhalation daily.  Take this medication every day for several weeks straight to see if it helps your overall breathing. We will prescribe albuterol for you to have available to use 2 puffs if needed for shortness of breath, wheezing, chest tightness. Continue to follow with Dr. Alen Blew and Dr. Karsten Ro.  Repeat chest imaging per their plans. Follow with Dr Lamonte Sakai in 6 months or sooner if you have any problems

## 2018-03-14 NOTE — Progress Notes (Signed)
Subjective:    Patient ID: Anthony Escobar, male    DOB: [birth year], 67 y.o.   MRN: [id number]  COPD  He complains of shortness of breath. There is no cough or wheezing. Pertinent negatives include no ear pain, fever, headaches, postnasal drip, rhinorrhea, sneezing, sore throat or trouble swallowing. His past medical history is significant for COPD.   67 year old former smoker (77 pack years, quit 1 year ago), with a history of hypertension, hyperlipidemia.  He is a self-referral today to evaluate shortness of breath. He first noticed this 2-3 months ago, found that he was having more chest congestion as well. He retired this Summer, has been doing a bit less since then. When he does outside yard work, bicycling. He is able to cough up mucous, often at night. He hears some wheeze at night as well, when he exerts. He has noticed some L costal marginal pain from coughing. He has been on lisinopril for about 2 yrs. He has never had an overt exacerbation of COPD. He has a globus sensation. He just restarted tagamet last week to see if it would help w cough. He has some mild baseline nasal congestion  ROV 10/28/17 --this is a follow-up visit for patient with a history of tobacco use, dyspnea.  Performed a chest x-ray at our initial visit 10/23 which suggested pulmonary nodular disease and prompted a CT scan of the chest that was done on 10/20/17.  The scan shows some emphysematous change and numerous small pulmonary nodules seen bilaterally.  The index nodules are in the right lower lobe measuring 10 mm, the lingula measuring 9 mm.  Etiology unclear.  PFTs have not yet been done.  He is here to review the scan. He is up to date on colon CA screening. No GI complaints, no testicular mass reported, no urinary dysfxn.   ROV 11/29/17 --[name] follows up today for his abnormal chest imaging with bilateral round pulmonary nodules.  The PET scan performed on 11/05/17 showed that these are hypermetabolic and also  showed an area of hypermetabolism in the prostate.  He has seen Dr. Karsten Ro with urology.  A prostate biopsy has been arranged.  This is all consistent with metastatic prostate cancer.  He underwent pulmonary function testing today that I have personally reviewed.  This shows moderately severe obstruction with a borderline bronchodilator response, decreased diffusion capacity. He is using albuterol, believes that it is beneficial. He is having some situational depression, tearfulness.   ROV 03/14/18 --pleasant 67 year old gentleman, former smoker, whom I have followed for probable COPD and also pulmonary nodules.  Evaluation revealed that the entire syndrome was consistent with metastatic prostate cancer with spread to the lungs.  He has been treated with Taxotere and reports to me that he has not experienced any side effects. He has energy and is exercising. He never started the spiriva, has never taken albuterol - has both available.    Review of Systems  Constitutional: Negative for fever and unexpected weight change.  HENT: Negative for congestion, dental problem, ear pain, nosebleeds, postnasal drip, rhinorrhea, sinus pressure, sneezing, sore throat and trouble swallowing.   Eyes: Negative for redness and itching.  Respiratory: Positive for chest tightness and shortness of breath. Negative for cough and wheezing.   Cardiovascular: Negative for palpitations and leg swelling.  Gastrointestinal: Negative for nausea and vomiting.  Genitourinary: Negative for dysuria.  Musculoskeletal: Negative for joint swelling.  Skin: Negative for rash.  Neurological: Negative for headaches.  Hematological: Does not  bruise/bleed easily.  Psychiatric/Behavioral: Negative for dysphoric mood. The patient is not nervous/anxious.     Past Medical History:  Diagnosis Date  . Hyperlipidemia   . Hypertension      Family History  Problem Relation Age of Onset  . Stroke Mother   . Cancer Father         esophageal     Social History   Socioeconomic History  . Marital status: Single    Spouse name: Not on file  . Number of children: Not on file  . Years of education: Not on file  . Highest education level: Not on file  Social Needs  . Financial resource strain: Not on file  . Food insecurity - worry: Not on file  . Food insecurity - inability: Not on file  . Transportation needs - medical: Not on file  . Transportation needs - non-medical: Not on file  Occupational History  . Not on file  Tobacco Use  . Smoking status: Former Smoker    Packs/day: 1.50    Years: 50.00    Pack years: 75.00    Types: Cigarettes    Last attempt to quit: 08/20/2011    Years since quitting: 6.5  . Smokeless tobacco: Never Used  Substance and Sexual Activity  . Alcohol use: Yes    Comment: 1beer a day  . Drug use: No  . Sexual activity: Not on file  Other Topics Concern  . Not on file  Social History Narrative  . Not on file  Has worked Therapist, music, Garment/textile technologist No fumes, powders No [employer]  [place] native.   No Known Allergies   Outpatient Medications Prior to Visit  Medication Sig Dispense Refill  . amitriptyline (ELAVIL) 75 MG tablet Take 75 mg by mouth at bedtime.    Marland Kitchen atorvastatin (LIPITOR) 10 MG tablet Take 10 mg by mouth daily.    Marland Kitchen lisinopril (PRINIVIL,ZESTRIL) 10 MG tablet Take 10 mg by mouth daily.     Marland Kitchen oxyCODONE (OXY IR/ROXICODONE) 5 MG immediate release tablet Take 1 tablet (5 mg total) by mouth every 4 (four) hours as needed for severe pain. 30 tablet 0  . prochlorperazine (COMPAZINE) 10 MG tablet Take 1 tablet (10 mg total) by mouth every 6 (six) hours as needed for nausea or vomiting. 30 tablet 0  . zolpidem (AMBIEN) 10 MG tablet Take 1 tablet (10 mg total) by mouth at bedtime as needed for sleep. 14 tablet 0  . albuterol (PROVENTIL HFA;VENTOLIN HFA) 108 (90 Base) MCG/ACT inhaler Inhale 2 puffs into the lungs every 4 (four) hours as needed for wheezing or shortness of  breath. (Patient not taking: Reported on 03/14/2018) 1 Inhaler 6  . tiotropium (SPIRIVA) 18 MCG inhalation capsule Place 1 capsule (18 mcg total) into inhaler and inhale daily. (Patient not taking: Reported on 03/14/2018) 30 capsule 5   No facility-administered medications prior to visit.         Objective:   Physical Exam Vitals:   03/14/18 0932  BP: (!) 140/100  Pulse: 98  SpO2: 96%  Weight: 165 lb (74.8 kg)  Height: 5\' 8"  (1.727 m)   Gen: Pleasant, well-nourished, in no distress,  normal affect  ENT: No lesions,  mouth clear,  oropharynx clear, no postnasal drip  Neck: No JVD, no stridor  Lungs: No use of accessory muscles, clear during a normal respiratory cycle, no wheezing  Cardiovascular: RRR, heart sounds normal, no murmur or gallops, no peripheral edema  Musculoskeletal: No deformities, no cyanosis  or clubbing  Neuro: alert, non focal  Skin: Warm, no lesions or rashes     Assessment & Plan:  COPD (chronic obstructive pulmonary disease) (HCC) We will try starting Spiriva 1 inhalation daily.  Take this medication every day for several weeks straight to see if it helps your overall breathing. We will prescribe albuterol for you to have available to use 2 puffs if needed for shortness of breath, wheezing, chest tightness. Follow with Dr Lamonte Sakai in 6 months or sooner if you have any problems  Prostate cancer [facility]) Continue to follow with Dr. Alen Blew and Dr. Karsten Ro.  Repeat chest imaging per their plans.  Baltazar Apo, MD, PhD 03/14/2018, 10:01 AM [facility] [phone] or if no answer [phone]

## 2018-03-16 ENCOUNTER — Encounter: Payer: Self-pay | Admitting: Oncology

## 2018-03-16 ENCOUNTER — Inpatient Hospital Stay: Payer: Medicare Other

## 2018-03-16 ENCOUNTER — Inpatient Hospital Stay: Payer: Medicare Other | Attending: Oncology

## 2018-03-16 ENCOUNTER — Inpatient Hospital Stay (HOSPITAL_BASED_OUTPATIENT_CLINIC_OR_DEPARTMENT_OTHER): Payer: Medicare Other | Admitting: Oncology

## 2018-03-16 VITALS — HR 103

## 2018-03-16 VITALS — BP 120/73 | HR 112 | Temp 98.5°F | Resp 20 | Ht 68.0 in | Wt 166.9 lb

## 2018-03-16 DIAGNOSIS — C7951 Secondary malignant neoplasm of bone: Secondary | ICD-10-CM

## 2018-03-16 DIAGNOSIS — C7800 Secondary malignant neoplasm of unspecified lung: Secondary | ICD-10-CM | POA: Insufficient documentation

## 2018-03-16 DIAGNOSIS — Z79899 Other long term (current) drug therapy: Secondary | ICD-10-CM | POA: Diagnosis not present

## 2018-03-16 DIAGNOSIS — Z5111 Encounter for antineoplastic chemotherapy: Secondary | ICD-10-CM | POA: Diagnosis present

## 2018-03-16 DIAGNOSIS — C61 Malignant neoplasm of prostate: Secondary | ICD-10-CM

## 2018-03-16 DIAGNOSIS — Z5189 Encounter for other specified aftercare: Secondary | ICD-10-CM | POA: Diagnosis present

## 2018-03-16 LAB — CMP (CANCER CENTER ONLY)
ALBUMIN: 3.5 g/dL (ref 3.5–5.0)
ALT: 19 U/L (ref 0–55)
ANION GAP: 11 (ref 3–11)
AST: 21 U/L (ref 5–34)
Alkaline Phosphatase: 85 U/L (ref 40–150)
BILIRUBIN TOTAL: 0.3 mg/dL (ref 0.2–1.2)
BUN: 15 mg/dL (ref 7–26)
CO2: 24 mmol/L (ref 22–29)
Calcium: 9.7 mg/dL (ref 8.4–10.4)
Chloride: 103 mmol/L (ref 98–109)
Creatinine: 1 mg/dL (ref 0.70–1.30)
GFR, Est AFR Am: >60 mL/min (ref >60)
GFR, Estimated: >60 mL/min (ref >60)
GLUCOSE: 99 mg/dL (ref 70–140)
POTASSIUM: 4 mmol/L (ref 3.5–5.1)
Sodium: 138 mmol/L (ref 136–145)
TOTAL PROTEIN: 7.1 g/dL (ref 6.4–8.3)

## 2018-03-16 LAB — CBC WITH DIFFERENTIAL (CANCER CENTER ONLY)
BASOS PCT: 1 %
Basophils Absolute: 0.1 10*3/uL (ref 0.0–0.1)
EOS PCT: 0 %
Eosinophils Absolute: 0 10*3/uL (ref 0.0–0.5)
HEMATOCRIT: 38.2 % — AB (ref 38.4–49.9)
Hemoglobin: 12.7 g/dL — ABNORMAL LOW (ref 13.0–17.1)
Lymphocytes Relative: 17 %
Lymphs Abs: 1.7 10*3/uL (ref 0.9–3.3)
MCH: 32.2 pg (ref 27.2–33.4)
MCHC: 33.3 g/dL (ref 32.0–36.0)
MCV: 96.7 fL (ref 79.3–98.0)
MONO ABS: 1.1 10*3/uL — AB (ref 0.1–0.9)
MONOS PCT: 10 %
NEUTROS ABS: 7.6 10*3/uL — AB (ref 1.5–6.5)
Neutrophils Relative %: 72 %
Platelet Count: 421 10*3/uL — ABNORMAL HIGH (ref 140–400)
RBC: 3.95 MIL/uL — ABNORMAL LOW (ref 4.20–5.82)
RDW: 13.8 % (ref 11.0–14.6)
WBC Count: 10.5 10*3/uL — ABNORMAL HIGH (ref 4.0–10.3)

## 2018-03-16 MED ORDER — DEXAMETHASONE SODIUM PHOSPHATE 10 MG/ML IJ SOLN
INTRAMUSCULAR | Status: AC
  Filled 2018-03-16: qty 1

## 2018-03-16 MED ORDER — SODIUM CHLORIDE 0.9 % IV SOLN
Freq: Once | INTRAVENOUS | Status: AC
  Administered 2018-03-16: 10:00:00 via INTRAVENOUS

## 2018-03-16 MED ORDER — DEXAMETHASONE SODIUM PHOSPHATE 10 MG/ML IJ SOLN
10.0000 mg | Freq: Once | INTRAMUSCULAR | Status: AC
  Administered 2018-03-16: 10 mg via INTRAVENOUS

## 2018-03-16 MED ORDER — SODIUM CHLORIDE 0.9 % IV SOLN
75.0000 mg/m2 | Freq: Once | INTRAVENOUS | Status: AC
  Administered 2018-03-16: 140 mg via INTRAVENOUS
  Filled 2018-03-16: qty 14

## 2018-03-16 NOTE — Patient Instructions (Signed)
[facility] Discharge Instructions for Patients Receiving Chemotherapy  Today you received the following chemotherapy agent: Taxotere  To help prevent nausea and vomiting after your treatment, we encourage you to take your nausea medication as directed   If you develop nausea and vomiting that is not controlled by your nausea medication, call the clinic.   BELOW ARE SYMPTOMS THAT SHOULD BE REPORTED IMMEDIATELY:  *FEVER GREATER THAN 100.5 F  *CHILLS WITH OR WITHOUT FEVER  NAUSEA AND VOMITING THAT IS NOT CONTROLLED WITH YOUR NAUSEA MEDICATION  *UNUSUAL SHORTNESS OF BREATH  *UNUSUAL BRUISING OR BLEEDING  TENDERNESS IN MOUTH AND THROAT WITH OR WITHOUT PRESENCE OF ULCERS  *URINARY PROBLEMS  *BOWEL PROBLEMS  UNUSUAL RASH Items with * indicate a potential emergency and should be followed up as soon as possible.  Feel free to call the clinic should you have any questions or concerns. The clinic phone number is [phone].  Please show the [place] at check-in to the Emergency Department and triage nurse.

## 2018-03-16 NOTE — Progress Notes (Signed)
[facility] OFFICE PROGRESS NOTE  Smothers, [name], NP [address] [place]  [address]  DIAGNOSIS: 67 year old gentleman advanced hormone sensitive prostate cancer diagnosed in January 2019.  He has pulmonary metastasis and biopsy-proven prostate cancer Gleason score 4+4 = 8 PSA of 19.57.  PRIOR THERAPY: He is status post prostate biopsy on December 29, 2017 showed a Gleason score 4+4 = 8 and 5 cores and Gleason score 4+3 = 7 as a tertiary pattern.  CURRENT THERAPY: Androgen deprivation therapy initially with Firmagon subsequently with Lupron.  Taxotere chemotherapy with the first cycle of chemotherapy given on February 02, 2018. He is here for cycle 3. Anticipate that he will complete 6 cycles of therapy.  INTERVAL HISTORY: CALEM COCOZZA 67 y.o. male returns for routine follow-up visit by himself.  The patient continues to tolerate his chemotherapy without any complications.  He has no recent complaints.  Patient denies fevers and chills.  Denies chest pain, shortness breath, cough, hemoptysis.  Denies nausea, vomiting, constipation, diarrhea. Denies weight loss or night sweats.  Denies neuropathy.  He does not report any headaches, blurry vision, syncope or seizures. Does not report any orthopnea or leg edema. Does not report any skeletal complaints.   Does not report frequency, urgency or hematuria.  Does not report any skin rashes or lesions. Does not report any heat or cold intolerance.  Does not report any lymphadenopathy or petechiae.  Does not report any anxiety or depression.  Remaining review of systems is negative.  The patient is here for evaluation prior to cycle #3 of his treatment.   MEDICAL HISTORY: Past Medical History:  Diagnosis Date  . Hyperlipidemia   . Hypertension     ALLERGIES:  has No Known Allergies.  MEDICATIONS:  Current Outpatient Medications  Medication Sig Dispense Refill  . albuterol (PROAIR HFA) 108 (90 Base)  MCG/ACT inhaler Inhale 2 puffs into the lungs every 4 (four) hours as needed for wheezing or shortness of breath. 1 Inhaler 5  . amitriptyline (ELAVIL) 75 MG tablet Take 75 mg by mouth at bedtime.    Marland Kitchen atorvastatin (LIPITOR) 10 MG tablet Take 10 mg by mouth daily.    Marland Kitchen lisinopril (PRINIVIL,ZESTRIL) 10 MG tablet Take 10 mg by mouth daily.     Marland Kitchen oxyCODONE (OXY IR/ROXICODONE) 5 MG immediate release tablet Take 1 tablet (5 mg total) by mouth every 4 (four) hours as needed for severe pain. (Patient not taking: Reported on 03/16/2018) 30 tablet 0  . prochlorperazine (COMPAZINE) 10 MG tablet Take 1 tablet (10 mg total) by mouth every 6 (six) hours as needed for nausea or vomiting. (Patient not taking: Reported on 03/16/2018) 30 tablet 0  . tiotropium (SPIRIVA) 18 MCG inhalation capsule Place 1 capsule (18 mcg total) into inhaler and inhale daily. (Patient not taking: Reported on 03/16/2018) 30 capsule 5  . zolpidem (AMBIEN) 10 MG tablet Take 1 tablet (10 mg total) by mouth at bedtime as needed for sleep. 14 tablet 0   No current facility-administered medications for this visit.     SURGICAL HISTORY:  Past Surgical History:  Procedure Laterality Date  . HERNIA REPAIR  1988   bilateral inguinal    REVIEW OF SYSTEMS:   Review of Systems  Constitutional: Negative for appetite change, chills, fatigue, fever and unexpected weight change.  HENT:   Negative for mouth sores, nosebleeds, sore throat and trouble swallowing.   Eyes: Negative for eye problems and icterus.  Respiratory: Negative for cough,  hemoptysis, shortness of breath and wheezing.   Cardiovascular: Negative for chest pain and leg swelling.  Gastrointestinal: Negative for abdominal pain, constipation, diarrhea, nausea and vomiting.  Genitourinary: Negative for bladder incontinence, difficulty urinating, dysuria, frequency and hematuria.   Musculoskeletal: Negative for back pain, gait problem, neck pain and neck stiffness.  Skin: Negative for  itching and rash.  Neurological: Negative for dizziness, extremity weakness, gait problem, headaches, light-headedness and seizures.  Hematological: Negative for adenopathy. Does not bruise/bleed easily.  Psychiatric/Behavioral: Negative for confusion, depression and sleep disturbance. The patient is not nervous/anxious.     PHYSICAL EXAMINATION:  Blood pressure 120/73, pulse (!) 112, temperature 98.5 F (36.9 C), temperature source Oral, resp. rate 20, height 5\' 8"  (1.727 m), weight 166 lb 14.4 oz (75.7 kg), SpO2 98 %.  ECOG PERFORMANCE STATUS: 1 - Symptomatic but completely ambulatory  Physical Exam  Constitutional: Oriented to person, place, and time and well-developed, well-nourished, and in no distress. No distress.  HENT:  Head: Normocephalic and atraumatic.  Mouth/Throat: Oropharynx is clear and moist. No oropharyngeal exudate.  Eyes: Conjunctivae are normal. Right eye exhibits no discharge. Left eye exhibits no discharge. No scleral icterus.  Neck: Normal range of motion. Neck supple.  Cardiovascular: Normal rate, regular rhythm, normal heart sounds and intact distal pulses.   Pulmonary/Chest: Effort normal and breath sounds normal. No respiratory distress. No wheezes. No rales.  Abdominal: Soft. Bowel sounds are normal. Exhibits no distension and no mass. There is no tenderness.  Musculoskeletal: Normal range of motion. Exhibits no edema.  Lymphadenopathy:    No cervical adenopathy.  Neurological: Alert and oriented to person, place, and time. Exhibits normal muscle tone. Gait normal. Coordination normal.  Skin: Skin is warm and dry. No rash noted. Not diaphoretic. No erythema. No pallor.  Psychiatric: Mood, memory and judgment normal.  Vitals reviewed.  LABORATORY DATA: Lab Results  Component Value Date   WBC 10.5 (H) 03/16/2018   HGB 13.6 05/23/2013   HCT 38.2 (L) 03/16/2018   MCV 96.7 03/16/2018   PLT 421 (H) 03/16/2018      Chemistry      Component Value  Date/Time   NA 138 03/16/2018 0852   K 4.0 03/16/2018 0852   CL 103 03/16/2018 0852   CO2 24 03/16/2018 0852   BUN 15 03/16/2018 0852   CREATININE 1.00 03/16/2018 0852   CREATININE 0.88 05/23/2013 1422      Component Value Date/Time   CALCIUM 9.7 03/16/2018 0852   ALKPHOS 85 03/16/2018 0852   AST 21 03/16/2018 0852   ALT 19 03/16/2018 0852   BILITOT 0.3 03/16/2018 0852     Results for ALWALEED, OBESO (MRN [id number]) as of 03/16/2018 15:36  Ref. Range 10/28/2017 16:24 02/23/2018 08:32  Prostate Specific Ag, Serum Latest Ref Range: 0.0 - 4.0 ng/mL  1.6    RADIOGRAPHIC STUDIES:  No results found.   ASSESSMENT/PLAN:  67 year old man with the following issues:  1.   Advanced hormone sensitive prostate cancer with disease to the bone.  He was diagnosed in January 2019 with PET/CT scan obtained in November 2018 showed FDG uptake in the lung as well as the prostate.  He is currently receiving androgen deprivation therapy and Taxotere chemotherapy was added.    The patient is tolerating his chemotherapy well without any concerning complaints.  The rationale and risks and benefits of continuing chemotherapy was reviewed again today.  Long-term complication associated with chemotherapy was reviewed which include nausea, fatigue, neuropathy as well as myelosuppression.  The benefit has been documented on multiple clinical trials including improvement in overall survival and metastatic free survival.  He is agreeable to proceed with cycle 3 without any dose reduction or delay.  2. IV access: Risks and benefits of Port-A-Cath insertion was reviewed again today and he is agreeable to continue with chemotherapy using peripheral veins.  3. Antiemetics: Compazine is available to the patient to use as needed for nausea.  No issues of nausea or vomiting.  4. Neutropenia prophylaxis: Neulasta will be on hold based on patient's preference for cost issues.  We will reintroduce at he  develops neutropenia.  5. Pelvic pain: Much improved at this time with the start of therapy.  6. Bone directed therapy: He is at risk of developing skeletal related events.  Delton See will be evaluated in the future after obtaining dental clearance.  7. Prognosis: His performance status remain excellent and aggressive treatment is warranted for his incurable malignancy.  8.Follow-up: We will be in 3 weeks for the next cycle of chemotherapy.  No orders of the defined types were placed in this encounter.  Mikey Bussing, DNP, AGPCNP-BC, AOCNP 03/16/18

## 2018-03-17 ENCOUNTER — Ambulatory Visit: Payer: Medicare Other

## 2018-03-17 LAB — PROSTATE-SPECIFIC AG, SERUM (LABCORP): Prostate Specific Ag, Serum: 0.9 ng/mL (ref 0.0–4.0)

## 2018-03-24 ENCOUNTER — Encounter: Payer: Self-pay | Admitting: *Deleted

## 2018-03-24 ENCOUNTER — Telehealth: Payer: Self-pay | Admitting: *Deleted

## 2018-03-24 NOTE — Telephone Encounter (Signed)
Patient's granddaughter logan calling to say patient has been "hoarse" for 5 days. Wants to know if this is from his chemotherapy treatment? Spoke with patient, he states he has had laryngitis for 3 days, denies fever, white patches in mouth or on tongue. Suggested he gargle with warm salt water and hydrate. States he has an appt with his PCP on 03/27/18. Encouraged him to keep this appt.

## 2018-04-06 ENCOUNTER — Inpatient Hospital Stay: Payer: Medicare Other

## 2018-04-06 ENCOUNTER — Telehealth: Payer: Self-pay | Admitting: Oncology

## 2018-04-06 ENCOUNTER — Other Ambulatory Visit: Payer: Medicare Other

## 2018-04-06 ENCOUNTER — Inpatient Hospital Stay: Payer: Medicare Other | Attending: Oncology

## 2018-04-06 ENCOUNTER — Inpatient Hospital Stay (HOSPITAL_BASED_OUTPATIENT_CLINIC_OR_DEPARTMENT_OTHER): Payer: Medicare Other | Admitting: Oncology

## 2018-04-06 VITALS — BP 106/75 | HR 111 | Temp 98.2°F | Resp 18 | Ht 68.0 in | Wt 168.4 lb

## 2018-04-06 VITALS — HR 96

## 2018-04-06 DIAGNOSIS — C7800 Secondary malignant neoplasm of unspecified lung: Secondary | ICD-10-CM

## 2018-04-06 DIAGNOSIS — C61 Malignant neoplasm of prostate: Secondary | ICD-10-CM | POA: Diagnosis not present

## 2018-04-06 DIAGNOSIS — Z79899 Other long term (current) drug therapy: Secondary | ICD-10-CM | POA: Diagnosis not present

## 2018-04-06 DIAGNOSIS — Z5111 Encounter for antineoplastic chemotherapy: Secondary | ICD-10-CM | POA: Insufficient documentation

## 2018-04-06 DIAGNOSIS — C7951 Secondary malignant neoplasm of bone: Secondary | ICD-10-CM

## 2018-04-06 DIAGNOSIS — Z5189 Encounter for other specified aftercare: Secondary | ICD-10-CM | POA: Diagnosis present

## 2018-04-06 LAB — CMP (CANCER CENTER ONLY)
ALT: 14 U/L (ref 0–55)
ANION GAP: 9 (ref 3–11)
AST: 20 U/L (ref 5–34)
Albumin: 3.6 g/dL (ref 3.5–5.0)
Alkaline Phosphatase: 66 U/L (ref 40–150)
BUN: 18 mg/dL (ref 7–26)
CHLORIDE: 102 mmol/L (ref 98–109)
CO2: 24 mmol/L (ref 22–29)
CREATININE: 1.15 mg/dL (ref 0.70–1.30)
Calcium: 9.4 mg/dL (ref 8.4–10.4)
Glucose, Bld: 101 mg/dL (ref 70–140)
POTASSIUM: 4.6 mmol/L (ref 3.5–5.1)
SODIUM: 135 mmol/L — AB (ref 136–145)
Total Bilirubin: 0.3 mg/dL (ref 0.2–1.2)
Total Protein: 6.9 g/dL (ref 6.4–8.3)

## 2018-04-06 LAB — CBC WITH DIFFERENTIAL (CANCER CENTER ONLY)
Basophils Absolute: 0 10*3/uL (ref 0.0–0.1)
Basophils Relative: 1 %
EOS ABS: 0 10*3/uL (ref 0.0–0.5)
Eosinophils Relative: 0 %
HEMATOCRIT: 37.3 % — AB (ref 38.4–49.9)
HEMOGLOBIN: 12.4 g/dL — AB (ref 13.0–17.1)
LYMPHS ABS: 1.6 10*3/uL (ref 0.9–3.3)
Lymphocytes Relative: 19 %
MCH: 32.1 pg (ref 27.2–33.4)
MCHC: 33.3 g/dL (ref 32.0–36.0)
MCV: 96.4 fL (ref 79.3–98.0)
Monocytes Absolute: 1.1 10*3/uL — ABNORMAL HIGH (ref 0.1–0.9)
Monocytes Relative: 14 %
NEUTROS ABS: 5.6 10*3/uL (ref 1.5–6.5)
NEUTROS PCT: 66 %
Platelet Count: 464 10*3/uL — ABNORMAL HIGH (ref 140–400)
RBC: 3.86 MIL/uL — AB (ref 4.20–5.82)
RDW: 14.2 % (ref 11.0–14.6)
WBC: 8.4 10*3/uL (ref 4.0–10.3)

## 2018-04-06 MED ORDER — DEXAMETHASONE SODIUM PHOSPHATE 10 MG/ML IJ SOLN
INTRAMUSCULAR | Status: AC
  Filled 2018-04-06: qty 1

## 2018-04-06 MED ORDER — SODIUM CHLORIDE 0.9 % IV SOLN
Freq: Once | INTRAVENOUS | Status: AC
  Administered 2018-04-06: 11:00:00 via INTRAVENOUS

## 2018-04-06 MED ORDER — DOCETAXEL CHEMO INJECTION 160 MG/16ML
75.0000 mg/m2 | Freq: Once | INTRAVENOUS | Status: AC
  Administered 2018-04-06: 140 mg via INTRAVENOUS
  Filled 2018-04-06: qty 14

## 2018-04-06 MED ORDER — DEXAMETHASONE SODIUM PHOSPHATE 10 MG/ML IJ SOLN
10.0000 mg | Freq: Once | INTRAMUSCULAR | Status: AC
  Administered 2018-04-06: 10 mg via INTRAVENOUS

## 2018-04-06 NOTE — Telephone Encounter (Signed)
Scheduled appt per 4/11 los- gave patient aVS and calender per los.

## 2018-04-06 NOTE — Patient Instructions (Signed)
[facility] Discharge Instructions for Patients Receiving Chemotherapy  Today you received the following chemotherapy agents: Taxotere  To help prevent nausea and vomiting after your treatment, we encourage you to take your nausea medication as directed.    If you develop nausea and vomiting that is not controlled by your nausea medication, call the clinic.   BELOW ARE SYMPTOMS THAT SHOULD BE REPORTED IMMEDIATELY:  *FEVER GREATER THAN 100.5 F  *CHILLS WITH OR WITHOUT FEVER  NAUSEA AND VOMITING THAT IS NOT CONTROLLED WITH YOUR NAUSEA MEDICATION  *UNUSUAL SHORTNESS OF BREATH  *UNUSUAL BRUISING OR BLEEDING  TENDERNESS IN MOUTH AND THROAT WITH OR WITHOUT PRESENCE OF ULCERS  *URINARY PROBLEMS  *BOWEL PROBLEMS  UNUSUAL RASH Items with * indicate a potential emergency and should be followed up as soon as possible.  Feel free to call the clinic should you have any questions or concerns. The clinic phone number is [phone].  Please show the CHEMO ALERT CARD at check-in to the Emergency Department and triage nurse.   

## 2018-04-06 NOTE — Progress Notes (Signed)
Hematology and Oncology Follow Up Visit  TERRACE FONTANILLA [id number] 08-25-1951 67 y.o. 04/06/2018 9:24 AM Smothers, [name], NPSmothers, [name], NP   Principle Diagnosis: 67 year old man with advanced castration sensitive prostate cancer with documented pulmonary metastasis in January 2019.  His Gleason score 4+4 = 8 and PSA of 19.57.   Prior Therapy:  He is status post prostate biopsy on December 29, 2017 showed a Gleason score 4+4 = 8 and 5 cores and Gleason score 4+3 = 7 as a tertiary pattern.  Current therapy:  Androgen deprivation therapy initially with Mills Koller subsequently with Lupron.  Taxotere chemotherapy with the first cycle of chemotherapy given on February 02, 2018.  He completed 3 cycles of therapy.  Interim History: Mr. Calico is here for a follow-up.  There is no major changes in his health.  He tolerated the last cycle of chemotherapy without complications.  He denies any nausea or infusion related complications.  He denies any excessive fatigue or tiredness.  Appetite remain excellent without any weight loss.  His performance status and activity level remain excellent.  He denies any complications related to chemotherapy infusion through his peripheral veins.    He does not report any headaches, blurry vision, or seizures.  He denies any peripheral neuropathy or neurological complaints.  Does not report any fevers, chills or sweats.  Does not report any cough, wheezing or hemoptysis.  Does not report any chest pain, palpitation, orthopnea or leg edema.  Does not report any vomiting.  Does not report any constipation or diarrhea.  Does not report any arthralgias or myalgias.   Does not report hematuria or dysuria.  Does not report any skin rashes or lesions. Does not report any heat or cold intolerance.  Does not report any skin rashes or lesions.  He denies any lymphadenopathy.  Does not report any anxiety or depression.  Remaining review of systems is negative.     Medications: I have reviewed the patient's current medications.  Current Outpatient Medications  Medication Sig Dispense Refill  . albuterol (PROAIR HFA) 108 (90 Base) MCG/ACT inhaler Inhale 2 puffs into the lungs every 4 (four) hours as needed for wheezing or shortness of breath. 1 Inhaler 5  . amitriptyline (ELAVIL) 75 MG tablet Take 75 mg by mouth at bedtime.    Marland Kitchen atorvastatin (LIPITOR) 10 MG tablet Take 10 mg by mouth daily.    Marland Kitchen lisinopril (PRINIVIL,ZESTRIL) 10 MG tablet Take 10 mg by mouth daily.     Marland Kitchen oxyCODONE (OXY IR/ROXICODONE) 5 MG immediate release tablet Take 1 tablet (5 mg total) by mouth every 4 (four) hours as needed for severe pain. (Patient not taking: Reported on 03/16/2018) 30 tablet 0  . prochlorperazine (COMPAZINE) 10 MG tablet Take 1 tablet (10 mg total) by mouth every 6 (six) hours as needed for nausea or vomiting. (Patient not taking: Reported on 03/16/2018) 30 tablet 0  . tiotropium (SPIRIVA) 18 MCG inhalation capsule Place 1 capsule (18 mcg total) into inhaler and inhale daily. (Patient not taking: Reported on 03/16/2018) 30 capsule 5  . zolpidem (AMBIEN) 10 MG tablet Take 1 tablet (10 mg total) by mouth at bedtime as needed for sleep. 14 tablet 0   No current facility-administered medications for this visit.      Allergies: No Known Allergies  Past Medical History, Surgical history, Social history, and Family History remain unchanged.  Review of Systems:  Physical Exam: Blood pressure 106/75, pulse (!) 111, temperature 98.2 F (36.8 C), temperature source Oral,  resp. rate 18, height 5\' 8"  (1.727 m), weight 168 lb 6.4 oz (76.4 kg), SpO2 100 %.   ECOG: 0 General appearance: Well-appearing gentleman but is comfortable. Head: Atraumatic without abnormalities. Oropharynx: No thrush or ulcers. Eyes: Conjunctiva is clear without icterus. Lymph nodes: No lymphadenopathy palpated in the cervical, axillary and supraclavicular regions. Heart: Regular rate and  rhythm without any murmurs or gallops. Lung: Clear to auscultation without any rhonchi, wheezes or dullness to percussion. Abdomin: Soft, nontender without any rebound or guarding.  No shifting dullness or ascites. Neurological: No motor or sensory deficits on neurological exam.  Intact deep tendon reflexes. Skin: No ecchymosis or petechiae. Musculoskeletal: No joint deformity or effusion.  Full range of motion noted.     Lab Results: Lab Results  Component Value Date   WBC 10.5 (H) 03/16/2018   HGB 13.6 05/23/2013   HCT 38.2 (L) 03/16/2018   MCV 96.7 03/16/2018   PLT 421 (H) 03/16/2018     Chemistry      Component Value Date/Time   NA 138 03/16/2018 0852   K 4.0 03/16/2018 0852   CL 103 03/16/2018 0852   CO2 24 03/16/2018 0852   BUN 15 03/16/2018 0852   CREATININE 1.00 03/16/2018 0852   CREATININE 0.88 05/23/2013 1422      Component Value Date/Time   CALCIUM 9.7 03/16/2018 0852   ALKPHOS 85 03/16/2018 0852   AST 21 03/16/2018 0852   ALT 19 03/16/2018 0852   BILITOT 0.3 03/16/2018 0852     Results for ISABELLA, IDA (MRN [id number]) as of 04/06/2018 09:24  Ref. Range 02/23/2018 08:32 03/16/2018 08:52  Prostate Specific Ag, Serum Latest Ref Range: 0.0 - 4.0 ng/mL 1.6 0.9    Impression and Plan:  67 year old man with the following issues:  1.    Advanced castration sensitive prostate cancer with disease documented to the lung.  He is currentlyTaxotere chemotherapy without complications.  His PSA continues to show excellent response without any symptoms related to his cancer.  Risks and benefits of continuing this therapy was reviewed today and is agreeable to continue.  Plan is to complete 6 cycles of therapy.   2.  IV access: Peripheral veins remain in use without any complications.  No need for Port-A-Cath at this time.  3.  Antiemetics: No nausea vomiting reported.  He has not used Compazine at all.  4.  Neutropenia prophylaxis: Growth factor support will  not be given at this time his white cell count is adequate without any issues.  5.  Pelvic pain: Resolved after the start of therapy.  6.  Bone directed therapy: He will need dental clearance before evaluation for Xgeva.  We will continue to address that with him in future visits.  7.  Androgen depravation: He was started on Firmagon in January 2019 but has not received any subsequent injections.  Risks and benefits of long-term Lupron was reviewed today and he is agreeable to receive it.  He will receive 30 mg of Lupron on Apr 27, 2018 and every 4 months.  8.  Prognosis: Treatment is palliative at this time but his performance status is excellent and aggressive measures are warranted.  9.  Follow-up: We will be  3 weeks for the next cycle of chemotherapy.   25  minutes was spent with the patient face-to-face today.  More than 50% of time was dedicated to patient counseling, education and answering questions regarding his future plan of care.     Zola Button, MD  4/11/20199:24 AM

## 2018-04-07 LAB — PROSTATE-SPECIFIC AG, SERUM (LABCORP): Prostate Specific Ag, Serum: 0.9 ng/mL (ref 0.0–4.0)

## 2018-04-27 ENCOUNTER — Inpatient Hospital Stay: Payer: Medicare Other

## 2018-04-27 ENCOUNTER — Inpatient Hospital Stay: Payer: Medicare Other | Attending: Oncology

## 2018-04-27 ENCOUNTER — Encounter: Payer: Self-pay | Admitting: Oncology

## 2018-04-27 ENCOUNTER — Other Ambulatory Visit: Payer: Medicare Other

## 2018-04-27 ENCOUNTER — Inpatient Hospital Stay (HOSPITAL_BASED_OUTPATIENT_CLINIC_OR_DEPARTMENT_OTHER): Payer: Medicare Other | Admitting: Oncology

## 2018-04-27 VITALS — BP 136/76 | HR 102 | Temp 97.8°F | Resp 18 | Ht 68.0 in | Wt 168.9 lb

## 2018-04-27 DIAGNOSIS — C61 Malignant neoplasm of prostate: Secondary | ICD-10-CM

## 2018-04-27 DIAGNOSIS — C7800 Secondary malignant neoplasm of unspecified lung: Secondary | ICD-10-CM

## 2018-04-27 DIAGNOSIS — Z79899 Other long term (current) drug therapy: Secondary | ICD-10-CM

## 2018-04-27 DIAGNOSIS — R6884 Jaw pain: Secondary | ICD-10-CM

## 2018-04-27 DIAGNOSIS — Z5111 Encounter for antineoplastic chemotherapy: Secondary | ICD-10-CM | POA: Insufficient documentation

## 2018-04-27 DIAGNOSIS — K047 Periapical abscess without sinus: Secondary | ICD-10-CM | POA: Diagnosis not present

## 2018-04-27 DIAGNOSIS — R05 Cough: Secondary | ICD-10-CM | POA: Diagnosis not present

## 2018-04-27 DIAGNOSIS — Z7189 Other specified counseling: Secondary | ICD-10-CM | POA: Insufficient documentation

## 2018-04-27 DIAGNOSIS — C7951 Secondary malignant neoplasm of bone: Secondary | ICD-10-CM

## 2018-04-27 DIAGNOSIS — Z5189 Encounter for other specified aftercare: Secondary | ICD-10-CM | POA: Diagnosis present

## 2018-04-27 LAB — CMP (CANCER CENTER ONLY)
ALK PHOS: 65 U/L (ref 40–150)
ALT: 11 U/L (ref 0–55)
AST: 19 U/L (ref 5–34)
Albumin: 3.5 g/dL (ref 3.5–5.0)
Anion gap: 10 (ref 3–11)
BILIRUBIN TOTAL: 0.2 mg/dL (ref 0.2–1.2)
BUN: 8 mg/dL (ref 7–26)
CALCIUM: 9.3 mg/dL (ref 8.4–10.4)
CO2: 22 mmol/L (ref 22–29)
CREATININE: 1.01 mg/dL (ref 0.70–1.30)
Chloride: 102 mmol/L (ref 98–109)
GFR, Est AFR Am: >60 mL/min (ref >60)
Glucose, Bld: 99 mg/dL (ref 70–140)
Potassium: 4.3 mmol/L (ref 3.5–5.1)
Sodium: 134 mmol/L — ABNORMAL LOW (ref 136–145)
Total Protein: 6.9 g/dL (ref 6.4–8.3)

## 2018-04-27 LAB — CBC WITH DIFFERENTIAL (CANCER CENTER ONLY)
BASOS PCT: 1 %
Basophils Absolute: 0.1 10*3/uL (ref 0.0–0.1)
EOS ABS: 0 10*3/uL (ref 0.0–0.5)
EOS PCT: 0 %
HCT: 34.7 % — ABNORMAL LOW (ref 38.4–49.9)
Hemoglobin: 11.7 g/dL — ABNORMAL LOW (ref 13.0–17.1)
LYMPHS ABS: 1.4 10*3/uL (ref 0.9–3.3)
Lymphocytes Relative: 15 %
MCH: 32.1 pg (ref 27.2–33.4)
MCHC: 33.6 g/dL (ref 32.0–36.0)
MCV: 95.7 fL (ref 79.3–98.0)
MONOS PCT: 12 %
Monocytes Absolute: 1.1 10*3/uL — ABNORMAL HIGH (ref 0.1–0.9)
Neutro Abs: 7 10*3/uL — ABNORMAL HIGH (ref 1.5–6.5)
Neutrophils Relative %: 72 %
PLATELETS: 569 10*3/uL — AB (ref 140–400)
RBC: 3.63 MIL/uL — AB (ref 4.20–5.82)
RDW: 15.3 % — ABNORMAL HIGH (ref 11.0–14.6)
WBC: 9.6 10*3/uL (ref 4.0–10.3)

## 2018-04-27 MED ORDER — DEXAMETHASONE SODIUM PHOSPHATE 10 MG/ML IJ SOLN
INTRAMUSCULAR | Status: AC
  Filled 2018-04-27: qty 1

## 2018-04-27 MED ORDER — SODIUM CHLORIDE 0.9 % IV SOLN
Freq: Once | INTRAVENOUS | Status: AC
  Administered 2018-04-27: 10:00:00 via INTRAVENOUS

## 2018-04-27 MED ORDER — AMOXICILLIN-POT CLAVULANATE 875-125 MG PO TABS: 1.0000 | ORAL_TABLET | Freq: Two times a day (BID) | ORAL | 0 refills | Status: DC

## 2018-04-27 MED ORDER — DOCETAXEL CHEMO INJECTION 160 MG/16ML
75.0000 mg/m2 | Freq: Once | INTRAVENOUS | Status: AC
  Administered 2018-04-27: 140 mg via INTRAVENOUS
  Filled 2018-04-27: qty 14

## 2018-04-27 MED ORDER — HEPARIN SOD (PORK) LOCK FLUSH 100 UNIT/ML IV SOLN
500.0000 [IU] | Freq: Once | INTRAVENOUS | Status: DC | PRN
  Filled 2018-04-27: qty 5

## 2018-04-27 MED ORDER — LEUPROLIDE ACETATE (4 MONTH) 30 MG IM KIT
30.0000 mg | PACK | Freq: Once | INTRAMUSCULAR | Status: AC
  Administered 2018-04-27: 30 mg via INTRAMUSCULAR
  Filled 2018-04-27: qty 30

## 2018-04-27 MED ORDER — DEXAMETHASONE SODIUM PHOSPHATE 10 MG/ML IJ SOLN
10.0000 mg | Freq: Once | INTRAMUSCULAR | Status: AC
  Administered 2018-04-27: 10 mg via INTRAVENOUS

## 2018-04-27 MED ORDER — LEUPROLIDE ACETATE (3 MONTH) 22.5 MG IM KIT
PACK | INTRAMUSCULAR | Status: AC
  Filled 2018-04-27: qty 22.5

## 2018-04-27 MED ORDER — SODIUM CHLORIDE 0.9% FLUSH
10.0000 mL | INTRAVENOUS | Status: DC | PRN
Start: 2018-04-27 — End: 2018-04-27
  Filled 2018-04-27: qty 10

## 2018-04-27 NOTE — Patient Instructions (Signed)
[facility] Discharge Instructions for Patients Receiving Chemotherapy  Today you received the following chemotherapy agents: Taxotere  To help prevent nausea and vomiting after your treatment, we encourage you to take your nausea medication as directed.    If you develop nausea and vomiting that is not controlled by your nausea medication, call the clinic.   BELOW ARE SYMPTOMS THAT SHOULD BE REPORTED IMMEDIATELY:  *FEVER GREATER THAN 100.5 F  *CHILLS WITH OR WITHOUT FEVER  NAUSEA AND VOMITING THAT IS NOT CONTROLLED WITH YOUR NAUSEA MEDICATION  *UNUSUAL SHORTNESS OF BREATH  *UNUSUAL BRUISING OR BLEEDING  TENDERNESS IN MOUTH AND THROAT WITH OR WITHOUT PRESENCE OF ULCERS  *URINARY PROBLEMS  *BOWEL PROBLEMS  UNUSUAL RASH Items with * indicate a potential emergency and should be followed up as soon as possible.  Feel free to call the clinic should you have any questions or concerns. The clinic phone number is [phone].  Please show the CHEMO ALERT CARD at check-in to the Emergency Department and triage nurse.   

## 2018-04-27 NOTE — Progress Notes (Signed)
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  His Gleason score 4+4 = 8 and PSA of 19.57.  PRIOR THERAPY:He is status post prostate biopsy on December 29, 2017 showed a Gleason score 4+4 = 8 and 5 cores and Gleason score 4+3 = 7 as a tertiary pattern.  CURRENT THERAPY:  Androgen deprivation therapy initially with Firmagon subsequently with Lupron.  Taxotere chemotherapy with the first cycle of chemotherapy given on February 02, 2018.  He has completed 4 cycles of therapy.  INTERVAL HISTORY: KEYLEN ECKENRODE 67 y.o. male returns for routine follow-up visit by himself.  The patient is feeling fine today and has no specific complaints except for a abscessed tooth.  He was deviously on penicillin and has now completed the course.  He is still having some pain to his right lower jaw.  He noticed some swelling last night has some purulent drainage from this area.  Denies fevers and chills.  Denies chest pain, shortness of breath, cough, hemoptysis.  Denies nausea, vomiting, constipation, diarrhea.  Denies peripheral neuropathy.  Denies recent weight loss or night sweats.  Patient denies any arthralgias or myalgias.  Denies skin rashes and lesions.  The remaining review of systems is negative.  The patient is here for evaluation prior to cycle #5 of his treatment.  MEDICAL HISTORY: Past Medical History:  Diagnosis Date  . Hyperlipidemia   . Hypertension     ALLERGIES:  has No Known Allergies.  MEDICATIONS:  Current Outpatient Medications  Medication Sig Dispense Refill  . albuterol (PROAIR HFA) 108 (90 Base) MCG/ACT inhaler Inhale 2 puffs into the lungs every 4 (four) hours as needed for wheezing or shortness of breath. 1 Inhaler 5  . amitriptyline (ELAVIL) 75 MG tablet Take 75 mg by  mouth at bedtime.    Marland Kitchen atorvastatin (LIPITOR) 10 MG tablet Take 10 mg by mouth daily.    Marland Kitchen lisinopril (PRINIVIL,ZESTRIL) 10 MG tablet Take 10 mg by mouth daily.     Marland Kitchen amoxicillin-clavulanate (AUGMENTIN) 875-125 MG tablet Take 1 tablet by mouth 2 (two) times daily. 20 tablet 0  . oxyCODONE (OXY IR/ROXICODONE) 5 MG immediate release tablet Take 1 tablet (5 mg total) by mouth every 4 (four) hours as needed for severe pain. (Patient not taking: Reported on 03/16/2018) 30 tablet 0  . prochlorperazine (COMPAZINE) 10 MG tablet Take 1 tablet (10 mg total) by mouth every 6 (six) hours as needed for nausea or vomiting. (Patient not taking: Reported on 03/16/2018) 30 tablet 0  . tiotropium (SPIRIVA) 18 MCG inhalation capsule Place 1 capsule (18 mcg total) into inhaler and inhale daily. (Patient not taking: Reported on 03/16/2018) 30 capsule 5   No current facility-administered medications for this visit.     SURGICAL HISTORY:  Past Surgical History:  Procedure Laterality Date  . HERNIA REPAIR  1988   bilateral inguinal    REVIEW OF SYSTEMS:   Review of Systems  Constitutional: Negative for appetite change, chills, fatigue, fever and unexpected weight change.  HENT:   Negative for nosebleeds, sore throat and trouble swallowing.  Positive for a right lower jaw wisdom tooth abscess.  He has mild pain to this area. Eyes: Negative for eye problems and icterus.  Respiratory: Negative for cough, hemoptysis, shortness of breath and wheezing.   Cardiovascular: Negative for chest pain  and leg swelling.  Gastrointestinal: Negative for abdominal pain, constipation, diarrhea, nausea and vomiting.  Genitourinary: Negative for bladder incontinence, difficulty urinating, dysuria, frequency and hematuria.   Musculoskeletal: Negative for back pain, gait problem, neck pain and neck stiffness.  Skin: Negative for itching and rash.  Neurological: Negative for dizziness, extremity weakness, gait problem, headaches,  light-headedness and seizures.  Hematological: Negative for adenopathy. Does not bruise/bleed easily.  Psychiatric/Behavioral: Negative for confusion, depression and sleep disturbance. The patient is not nervous/anxious.     PHYSICAL EXAMINATION:  Blood pressure 136/76, pulse (!) 102, temperature 97.8 F (36.6 C), temperature source Oral, resp. rate 18, height 5\' 8"  (1.727 m), weight 168 lb 14.4 oz (76.6 kg), SpO2 100 %.  ECOG PERFORMANCE STATUS: 1 - Symptomatic but completely ambulatory  Physical Exam  Constitutional: Oriented to person, place, and time and well-developed, well-nourished, and in no distress. No distress.  HENT:  Head: Normocephalic and atraumatic.  Mouth/Throat: No oropharyngeal exudate. Right lower jaw near the wisdom tooth with mild erythema and swelling. Eyes: Conjunctivae are normal. Right eye exhibits no discharge. Left eye exhibits no discharge. No scleral icterus.  Neck: Normal range of motion. Neck supple.  Cardiovascular: Normal rate, regular rhythm, normal heart sounds and intact distal pulses.   Pulmonary/Chest: Effort normal and breath sounds normal. No respiratory distress. No wheezes. No rales.  Abdominal: Soft. Bowel sounds are normal. Exhibits no distension and no mass. There is no tenderness.  Musculoskeletal: Normal range of motion. Exhibits no edema.  Lymphadenopathy:    No cervical adenopathy.  Neurological: Alert and oriented to person, place, and time. Exhibits normal muscle tone. Gait normal. Coordination normal.  Skin: Skin is warm and dry. No rash noted. Not diaphoretic. No erythema. No pallor.  Psychiatric: Mood, memory and judgment normal.  Vitals reviewed.  LABORATORY DATA: Lab Results  Component Value Date   WBC 9.6 04/27/2018   HGB 11.7 (L) 04/27/2018   HCT 34.7 (L) 04/27/2018   MCV 95.7 04/27/2018   PLT 569 (H) 04/27/2018      Chemistry      Component Value Date/Time   NA 134 (L) 04/27/2018 0834   K 4.3 04/27/2018 0834   CL  102 04/27/2018 0834   CO2 22 04/27/2018 0834   BUN 8 04/27/2018 0834   CREATININE 1.01 04/27/2018 0834   CREATININE 0.88 05/23/2013 1422      Component Value Date/Time   CALCIUM 9.3 04/27/2018 0834   ALKPHOS 65 04/27/2018 0834   AST 19 04/27/2018 0834   ALT 11 04/27/2018 0834   BILITOT 0.2 04/27/2018 0834     Results for JOWAN, SKILLIN (MRN [id number]) as of 04/27/2018 09:42  Ref. Range 10/28/2017 16:24 02/23/2018 08:32 03/16/2018 08:52 04/06/2018 09:17  Prostate Specific Ag, Serum Latest Ref Range: 0.0 - 4.0 ng/mL  1.6 0.9 0.9   RADIOGRAPHIC STUDIES:  No results found.   ASSESSMENT/PLAN:   67 year old man with the following issues:  1.   Advanced castration sensitive prostate cancer with disease documented to the lung.  He is currentlyTaxotere chemotherapy without complications.  His PSA continues to show excellent response without any symptoms related to his cancer.  Risks and benefits of continuing this therapy was reviewed today and is agreeable to continue.  Plan is to complete 6 cycles of therapy.  He will proceed with cycle 5 as scheduled today.  2. IV access: Peripheral veins remain in use without any complications.  No need for Port-A-Cath at this time.  3. Antiemetics: No nausea  vomiting reported.  He has not used Compazine at all.  4. Neutropenia prophylaxis: Growth factor support will not be given at this time his white cell count is adequate without any issues.  5. Pelvic pain: Resolved after the start of therapy.  6. Bone directed therapy: He will need dental clearance before evaluation for Xgeva.  We will continue to address that with him in future visits.  7.  Androgen depravation: He was started on Firmagon in January 2019 but has not received any subsequent injections.  Risks and benefits of long-term Lupron was reviewed today and he is agreeable to receive it.  He will receive 30 mg of Lupron today and every 4 months.  8.  Right lower jaw  dental abscess.  Patient was given a prescription for Augmentin 875 mg twice a day for 10 days.  She already has oxycodone at home as needed for his pain.  Recommend that he follow-up with his dentist in the near future for further management of this issue.  9. Prognosis: Treatment is palliative at this time but his performance status is excellent and aggressive measures are warranted.  10.Follow-up: We will be  3 weeks for the next cycle of chemotherapy.  No orders of the defined types were placed in this encounter.  Mikey Bussing, DNP, AGPCNP-BC, AOCNP 04/27/18

## 2018-04-28 LAB — PROSTATE-SPECIFIC AG, SERUM (LABCORP): Prostate Specific Ag, Serum: 1.1 ng/mL (ref 0.0–4.0)

## 2018-04-28 LAB — TESTOSTERONE: TESTOSTERONE: 27 ng/dL — AB (ref 264–916)

## 2018-05-18 ENCOUNTER — Other Ambulatory Visit: Payer: Medicare Other

## 2018-05-18 ENCOUNTER — Inpatient Hospital Stay: Payer: Medicare Other

## 2018-05-18 ENCOUNTER — Encounter: Payer: Self-pay | Admitting: Medical Oncology

## 2018-05-18 ENCOUNTER — Telehealth: Payer: Self-pay

## 2018-05-18 ENCOUNTER — Inpatient Hospital Stay (HOSPITAL_BASED_OUTPATIENT_CLINIC_OR_DEPARTMENT_OTHER): Payer: Medicare Other | Admitting: Oncology

## 2018-05-18 VITALS — HR 79

## 2018-05-18 VITALS — BP 117/85 | HR 102 | Temp 97.8°F | Resp 18 | Ht 68.0 in | Wt 172.6 lb

## 2018-05-18 DIAGNOSIS — C7800 Secondary malignant neoplasm of unspecified lung: Secondary | ICD-10-CM

## 2018-05-18 DIAGNOSIS — Z79899 Other long term (current) drug therapy: Secondary | ICD-10-CM

## 2018-05-18 DIAGNOSIS — R05 Cough: Secondary | ICD-10-CM

## 2018-05-18 DIAGNOSIS — Z5111 Encounter for antineoplastic chemotherapy: Secondary | ICD-10-CM | POA: Diagnosis not present

## 2018-05-18 DIAGNOSIS — C7951 Secondary malignant neoplasm of bone: Secondary | ICD-10-CM

## 2018-05-18 DIAGNOSIS — C61 Malignant neoplasm of prostate: Secondary | ICD-10-CM

## 2018-05-18 LAB — CBC WITH DIFFERENTIAL (CANCER CENTER ONLY)
BASOS ABS: 0 10*3/uL (ref 0.0–0.1)
Basophils Relative: 1 %
EOS PCT: 2 %
Eosinophils Absolute: 0.1 10*3/uL (ref 0.0–0.5)
HEMATOCRIT: 35.6 % — AB (ref 38.4–49.9)
Hemoglobin: 11.6 g/dL — ABNORMAL LOW (ref 13.0–17.1)
LYMPHS ABS: 1.5 10*3/uL (ref 0.9–3.3)
LYMPHS PCT: 16 %
MCH: 31.5 pg (ref 27.2–33.4)
MCHC: 32.6 g/dL (ref 32.0–36.0)
MCV: 96.7 fL (ref 79.3–98.0)
MONO ABS: 1.1 10*3/uL — AB (ref 0.1–0.9)
Monocytes Relative: 12 %
Neutro Abs: 6.2 10*3/uL (ref 1.5–6.5)
Neutrophils Relative %: 69 %
PLATELETS: 464 10*3/uL — AB (ref 140–400)
RBC: 3.68 MIL/uL — AB (ref 4.20–5.82)
RDW: 16.7 % — ABNORMAL HIGH (ref 11.0–14.6)
WBC: 8.9 10*3/uL (ref 4.0–10.3)

## 2018-05-18 LAB — CMP (CANCER CENTER ONLY)
ALT: 11 U/L (ref 0–55)
AST: 17 U/L (ref 5–34)
Albumin: 3.4 g/dL — ABNORMAL LOW (ref 3.5–5.0)
Alkaline Phosphatase: 53 U/L (ref 40–150)
Anion gap: 8 (ref 3–11)
BILIRUBIN TOTAL: 0.3 mg/dL (ref 0.2–1.2)
BUN: 11 mg/dL (ref 7–26)
CALCIUM: 8.9 mg/dL (ref 8.4–10.4)
CHLORIDE: 107 mmol/L (ref 98–109)
CO2: 23 mmol/L (ref 22–29)
CREATININE: 0.91 mg/dL (ref 0.70–1.30)
Glucose, Bld: 100 mg/dL (ref 70–140)
Potassium: 4.1 mmol/L (ref 3.5–5.1)
Sodium: 138 mmol/L (ref 136–145)
TOTAL PROTEIN: 6.6 g/dL (ref 6.4–8.3)

## 2018-05-18 MED ORDER — SODIUM CHLORIDE 0.9 % IV SOLN
Freq: Once | INTRAVENOUS | Status: AC
  Administered 2018-05-18: 10:00:00 via INTRAVENOUS

## 2018-05-18 MED ORDER — DEXAMETHASONE SODIUM PHOSPHATE 10 MG/ML IJ SOLN
INTRAMUSCULAR | Status: AC
  Filled 2018-05-18: qty 1

## 2018-05-18 MED ORDER — SODIUM CHLORIDE 0.9 % IV SOLN
75.0000 mg/m2 | Freq: Once | INTRAVENOUS | Status: AC
  Administered 2018-05-18: 140 mg via INTRAVENOUS
  Filled 2018-05-18: qty 14

## 2018-05-18 MED ORDER — DEXAMETHASONE SODIUM PHOSPHATE 10 MG/ML IJ SOLN
10.0000 mg | Freq: Once | INTRAMUSCULAR | Status: AC
  Administered 2018-05-18: 10 mg via INTRAVENOUS

## 2018-05-18 NOTE — Telephone Encounter (Signed)
Printed avs and calender of upcoming appointment. Per 5/23 los also gave patient contrast with instructions

## 2018-05-18 NOTE — Progress Notes (Signed)
Hematology and Oncology Follow Up Visit  Anthony Escobar [id number] 08-13-1951 67 y.o. 05/18/2018 8:20 AM Smothers, Anthony Escobar, NPSmothers, Anthony Elk, NP   Principle Diagnosis: 67 year old man with castration-sensitive prostate cancer diagnosed in January 2019.  His Gleason score 4+4 = 8 and PSA of 19.57 with documented metastatic disease to the lung.   Prior Therapy:  He is status post prostate biopsy on December 29, 2017 showed a Gleason score 4+4 = 8 and 5 cores and Gleason score 4+3 = 7 as a tertiary pattern.  Current therapy:  Androgen deprivation therapy initially with Anthony Escobar subsequently with Lupron.  Taxotere chemotherapy with the first cycle of chemotherapy given on February 02, 2018.  He is here for cycle 6 of therapy.  Interim History: Anthony Escobar is here for a follow-up.  Since the last visit, he received the last cycle of chemotherapy without complications.  He denies any nausea, neuropathy or infusion related complications.  He remains active and attends to activities of daily living.  He does report mild dyspnea on exertion at times after working in his yard for an extended period of time.  He does report a periodic cough but no shortness of breath or hemoptysis.  He denies any arthralgias or myalgias.  His appetite remain excellent and his weight is elevated.    He does not report any headaches, blurry vision, or seizures.  He denies any alteration in mental status or confusion.  Does not report any fevers, chills or sweats.  Does not report any cough, wheezing or hemoptysis.  Does not report any chest pain, palpitation, orthopnea or leg edema.  Does not report any vomiting.  Does not report any constipation or diarrhea.    He denies any hematochezia or melena.  Does not report any bone pain or pathological fractures.   Does not report hematuria or dysuria.  Does not report any skin rashes or lesions. Does not report any heat or cold intolerance.  Does not report any skin rashes or  lesions.  He denies any easy bruising..  Does not report any mood changes.  Remaining review of systems is negative.    Medications: I have reviewed the patient's current medications.  Current Outpatient Medications  Medication Sig Dispense Refill  . albuterol (PROAIR HFA) 108 (90 Base) MCG/ACT inhaler Inhale 2 puffs into the lungs every 4 (four) hours as needed for wheezing or shortness of breath. 1 Inhaler 5  . amitriptyline (ELAVIL) 75 MG tablet Take 75 mg by mouth at bedtime.    Marland Kitchen amoxicillin-clavulanate (AUGMENTIN) 875-125 MG tablet Take 1 tablet by mouth 2 (two) times daily. 20 tablet 0  . atorvastatin (LIPITOR) 10 MG tablet Take 10 mg by mouth daily.    Marland Kitchen lisinopril (PRINIVIL,ZESTRIL) 10 MG tablet Take 10 mg by mouth daily.     Marland Kitchen oxyCODONE (OXY IR/ROXICODONE) 5 MG immediate release tablet Take 1 tablet (5 mg total) by mouth every 4 (four) hours as needed for severe pain. (Patient not taking: Reported on 03/16/2018) 30 tablet 0  . prochlorperazine (COMPAZINE) 10 MG tablet Take 1 tablet (10 mg total) by mouth every 6 (six) hours as needed for nausea or vomiting. (Patient not taking: Reported on 03/16/2018) 30 tablet 0  . tiotropium (SPIRIVA) 18 MCG inhalation capsule Place 1 capsule (18 mcg total) into inhaler and inhale daily. (Patient not taking: Reported on 03/16/2018) 30 capsule 5   No current facility-administered medications for this visit.      Allergies: No Known Allergies  Past  Medical History, Surgical history, Social history, and Family History reviewed is unchanged.  Review of Systems:  Physical Exam: Blood pressure 117/85, pulse (!) 102, temperature 97.8 F (36.6 C), temperature source Oral, resp. rate 18, height 5\' 8"  (1.727 m), weight 172 lb 9.6 oz (78.3 kg), SpO2 98 %.   ECOG: 0 General appearance: Well-appearing gentleman without distress. Head: Normocephalic without abnormalities. Oropharynx: Mucous membranes are moist and pink. Eyes: Pupils are equal and round  reactive to light. Lymph nodes: No cervical, axillary and supraclavicular lymphadenopathy noted.   Heart: Regular rate without any murmurs or gallops.  No leg edema. Lung: Scattered rhonchi and expiratory wheezes noted at the bases.  No dullness to percussion. Abdomin: Soft, nontender, nondistended with good bowel sounds.  No shifting dullness or ascites. Neurological: Intact motor, sensory exam. Skin: No skin rashes or lesions. Musculoskeletal: No clubbing or cyanosis.     Lab Results: Lab Results  Component Value Date   WBC 8.9 05/18/2018   HGB 11.6 (L) 05/18/2018   HCT 35.6 (L) 05/18/2018   MCV 96.7 05/18/2018   PLT 464 (H) 05/18/2018     Chemistry      Component Value Date/Time   NA 134 (L) 04/27/2018 0834   K 4.3 04/27/2018 0834   CL 102 04/27/2018 0834   CO2 22 04/27/2018 0834   BUN 8 04/27/2018 0834   CREATININE 1.01 04/27/2018 0834   CREATININE 0.88 05/23/2013 1422      Component Value Date/Time   CALCIUM 9.3 04/27/2018 0834   ALKPHOS 65 04/27/2018 0834   AST 19 04/27/2018 0834   ALT 11 04/27/2018 0834   BILITOT 0.2 04/27/2018 0834      Results for Anthony, Escobar (MRN [id number]) as of 05/18/2018 08:21  Ref. Range 02/23/2018 08:32 03/16/2018 08:52 04/06/2018 09:17 04/27/2018 08:34  Prostate Specific Ag, Serum Latest Ref Range: 0.0 - 4.0 ng/mL 1.6 0.9 0.9 1.1    Impression and Plan:  67 year old man with the following issues:  1.   Castration-sensitive prostate cancer with metastatic disease noted in the lung.  He will receive cycle 6 of Taxotere chemotherapy which will conclude his course of treatment.  Upon completing his chemotherapy he will undergo staging work-up including a CT scan and bone scan for staging purposes.  The natural course of this disease moving forward was explained to the patient.  He had an excellent response to therapy but additional therapy may be needed to maintain his disease response.  I will be in the form of Zytiga or Xtandi in the  pill form.  He will follow-up in 7 weeks after staging work-up to determine the next course of action.  His PSA is showing reasonable response at this time.   2.  IV access: No issues obtaining IV access via peripheral veins.  He did not require a Port-A-Cath insertion.  3.  Antiemetics: Compazine is available to him but no nausea or vomiting reported.  4.  Neutropenia prophylaxis: Growth factor was not given with this therapy.  His white cell count continues to be adequate.  5.  Bone pain: Resolved at this time after treatment with chemotherapy.  6.  Bone directed therapy: We will restage him with a bone scan to assess his bone disease and will consider bone targeted therapy in the future after obtaining dental clearance.  7.  Androgen depravation: He is currently on Lupron and received last injection on Apr 27, 2018.  This will be repeated every 4 months.  Risks and benefits  of this treatment was reviewed today is agreeable to continue.  8.  Prognosis: Goal of therapy remains palliative although his performance status and quality of life remain excellent and aggressive therapy is warranted.  9.  Follow-up: We will be in 7 weeks to follow his progress.   25  minutes was spent with the patient face-to-face today.  More than 50% of time was dedicated to patient counseling, education and coordinating his multifaceted care including answering questions about future treatment options.    Zola Button, MD 5/23/20198:20 AM

## 2018-05-18 NOTE — Patient Instructions (Signed)
[facility] Discharge Instructions for Patients Receiving Chemotherapy  Today you received the following chemotherapy agents: Taxotere  To help prevent nausea and vomiting after your treatment, we encourage you to take your nausea medication as directed.    If you develop nausea and vomiting that is not controlled by your nausea medication, call the clinic.   BELOW ARE SYMPTOMS THAT SHOULD BE REPORTED IMMEDIATELY:  *FEVER GREATER THAN 100.5 F  *CHILLS WITH OR WITHOUT FEVER  NAUSEA AND VOMITING THAT IS NOT CONTROLLED WITH YOUR NAUSEA MEDICATION  *UNUSUAL SHORTNESS OF BREATH  *UNUSUAL BRUISING OR BLEEDING  TENDERNESS IN MOUTH AND THROAT WITH OR WITHOUT PRESENCE OF ULCERS  *URINARY PROBLEMS  *BOWEL PROBLEMS  UNUSUAL RASH Items with * indicate a potential emergency and should be followed up as soon as possible.  Feel free to call the clinic should you have any questions or concerns. The clinic phone number is [phone].  Please show the CHEMO ALERT CARD at check-in to the Emergency Department and triage nurse.   

## 2018-05-19 ENCOUNTER — Telehealth: Payer: Self-pay | Admitting: *Deleted

## 2018-05-19 LAB — PROSTATE-SPECIFIC AG, SERUM (LABCORP): PROSTATE SPECIFIC AG, SERUM: 0.9 ng/mL (ref 0.0–4.0)

## 2018-05-19 NOTE — Telephone Encounter (Signed)
Spoke with patient, gave results of last PSA 

## 2018-05-19 NOTE — Telephone Encounter (Signed)
-----   Message from [name], MD sent at 05/19/2018  8:10 AM EDT ----- Please let him know his PSA is down.

## 2018-07-04 ENCOUNTER — Encounter (HOSPITAL_COMMUNITY)
Admission: RE | Admit: 2018-07-04 | Discharge: 2018-07-04 | Disposition: A | Discharge status: Home / Self Care | Payer: Medicare Other | Source: Ambulatory Visit | Attending: Oncology | Admitting: Oncology

## 2018-07-04 ENCOUNTER — Encounter (HOSPITAL_COMMUNITY): Payer: Self-pay

## 2018-07-04 ENCOUNTER — Inpatient Hospital Stay: Payer: Medicare Other | Attending: Oncology

## 2018-07-04 ENCOUNTER — Ambulatory Visit (HOSPITAL_COMMUNITY)
Admission: RE | Admit: 2018-07-04 | Discharge: 2018-07-04 | Disposition: A | Discharge status: Home / Self Care | Payer: Medicare Other | Source: Ambulatory Visit | Attending: Oncology | Admitting: Oncology

## 2018-07-04 DIAGNOSIS — C7800 Secondary malignant neoplasm of unspecified lung: Secondary | ICD-10-CM | POA: Insufficient documentation

## 2018-07-04 DIAGNOSIS — C61 Malignant neoplasm of prostate: Secondary | ICD-10-CM | POA: Insufficient documentation

## 2018-07-04 DIAGNOSIS — Z9221 Personal history of antineoplastic chemotherapy: Secondary | ICD-10-CM | POA: Insufficient documentation

## 2018-07-04 DIAGNOSIS — C7951 Secondary malignant neoplasm of bone: Secondary | ICD-10-CM | POA: Insufficient documentation

## 2018-07-04 DIAGNOSIS — R918 Other nonspecific abnormal finding of lung field: Secondary | ICD-10-CM | POA: Insufficient documentation

## 2018-07-04 DIAGNOSIS — Z79899 Other long term (current) drug therapy: Secondary | ICD-10-CM | POA: Diagnosis not present

## 2018-07-04 LAB — CMP (CANCER CENTER ONLY)
ALT: 12 U/L (ref 0–44)
AST: 22 U/L (ref 15–41)
Albumin: 4.1 g/dL (ref 3.5–5.0)
Alkaline Phosphatase: 74 U/L (ref 38–126)
Anion gap: 8 (ref 5–15)
BILIRUBIN TOTAL: 0.5 mg/dL (ref 0.3–1.2)
BUN: 17 mg/dL (ref 8–23)
CHLORIDE: 101 mmol/L (ref 98–111)
CO2: 28 mmol/L (ref 22–32)
Calcium: 10.2 mg/dL (ref 8.9–10.3)
Creatinine: 1.04 mg/dL (ref 0.61–1.24)
GFR, Est AFR Am: >60 mL/min (ref >60)
GFR, Estimated: >60 mL/min (ref >60)
Glucose, Bld: 100 mg/dL — ABNORMAL HIGH (ref 70–99)
POTASSIUM: 5 mmol/L (ref 3.5–5.1)
Sodium: 137 mmol/L (ref 135–145)
TOTAL PROTEIN: 7.6 g/dL (ref 6.5–8.1)

## 2018-07-04 LAB — CBC WITH DIFFERENTIAL (CANCER CENTER ONLY)
Basophils Absolute: 0 10*3/uL (ref 0.0–0.1)
Basophils Relative: 1 %
EOS PCT: 3 %
Eosinophils Absolute: 0.2 10*3/uL (ref 0.0–0.5)
HCT: 40.8 % (ref 38.4–49.9)
Hemoglobin: 13.8 g/dL (ref 13.0–17.1)
LYMPHS ABS: 1.2 10*3/uL (ref 0.9–3.3)
LYMPHS PCT: 20 %
MCH: 32.6 pg (ref 27.2–33.4)
MCHC: 33.9 g/dL (ref 32.0–36.0)
MCV: 96.3 fL (ref 79.3–98.0)
MONOS PCT: 10 %
Monocytes Absolute: 0.6 10*3/uL (ref 0.1–0.9)
Neutro Abs: 4 10*3/uL (ref 1.5–6.5)
Neutrophils Relative %: 66 %
PLATELETS: 414 10*3/uL — AB (ref 140–400)
RBC: 4.24 MIL/uL (ref 4.20–5.82)
RDW: 17.7 % — AB (ref 11.0–14.6)
WBC Count: 6.1 10*3/uL (ref 4.0–10.3)

## 2018-07-04 MED ORDER — TECHNETIUM TC 99M MEDRONATE IV KIT
21.0000 | PACK | Freq: Once | INTRAVENOUS | Status: AC | PRN
  Administered 2018-07-04: 21 via INTRAVENOUS

## 2018-07-04 MED ORDER — IOPAMIDOL (ISOVUE-300) INJECTION 61%
100.0000 mL | Freq: Once | INTRAVENOUS | Status: AC | PRN
  Administered 2018-07-04: 100 mL via INTRAVENOUS

## 2018-07-04 MED ORDER — IOPAMIDOL (ISOVUE-300) INJECTION 61%
INTRAVENOUS | Status: AC
  Filled 2018-07-04: qty 100

## 2018-07-05 LAB — PROSTATE-SPECIFIC AG, SERUM (LABCORP): Prostate Specific Ag, Serum: 0.8 ng/mL (ref 0.0–4.0)

## 2018-07-06 ENCOUNTER — Inpatient Hospital Stay (HOSPITAL_BASED_OUTPATIENT_CLINIC_OR_DEPARTMENT_OTHER): Payer: Medicare Other | Admitting: Oncology

## 2018-07-06 VITALS — BP 149/91 | HR 109 | Temp 98.4°F | Resp 19 | Ht 68.0 in | Wt 163.7 lb

## 2018-07-06 DIAGNOSIS — Z9221 Personal history of antineoplastic chemotherapy: Secondary | ICD-10-CM

## 2018-07-06 DIAGNOSIS — Z79899 Other long term (current) drug therapy: Secondary | ICD-10-CM | POA: Diagnosis not present

## 2018-07-06 DIAGNOSIS — C7951 Secondary malignant neoplasm of bone: Secondary | ICD-10-CM

## 2018-07-06 DIAGNOSIS — C61 Malignant neoplasm of prostate: Secondary | ICD-10-CM | POA: Diagnosis not present

## 2018-07-06 DIAGNOSIS — C7800 Secondary malignant neoplasm of unspecified lung: Secondary | ICD-10-CM

## 2018-07-06 NOTE — Progress Notes (Signed)
Hematology and Oncology Follow Up Visit  Anthony Escobar [id number] [birth year] 67 y.o. 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  His Gleason score 4+4 = 8 and PSA of 19.57 at the time of diagnosis.    Prior Therapy:  He is status post prostate biopsy on December 29, 2017 showed a Gleason score 4+4 = 8 and 5 cores.   Taxotere chemotherapy with the first cycle of chemotherapy given on February 02, 2018.  He completed 6 cycles of therapy on May 18, 2018.   Current therapy:  Androgen deprivation therapy with Lupron.    Interim History: Anthony Escobar presents today for a follow-up.  Since last visit, he completed 6 cycles of Taxotere chemotherapy without complications.  He denies any delayed complications or residual toxicities.  He continues to be active and attends to activities of daily living.  He has lost weight intentionally currently on 163 pounds.  He denies any bone pain or hip pain.  He denies any falls or syncope.  His performance status and activity level remains excellent.   He does not report any headaches, blurry vision, or seizures.  He denies any residual neuropathy..  Does not report any fevers, chills or sweats.  Does not report any cough, wheezing or hemoptysis.  Does not report any chest pain, palpitation, orthopnea or leg edema.  Does not report any vomiting.  Does not report any change in his bowel habits.   He denies any early satiety.  Does not report any bone pain.  He denies any arthralgias or myalgias.  Does not report hematuria or dysuria.  Does not report any skin rashes or lesions. Does not report any skin rashes or lesions.  He denies any easy bruising or bleeding tendencies.  Does not report any anxiety or depression.  Remaining review of systems is negative.    Medications: I have reviewed the patient's current  medications.  Current Outpatient Medications  Medication Sig Dispense Refill  . albuterol (PROAIR HFA) 108 (90 Base) MCG/ACT inhaler Inhale 2 puffs into the lungs every 4 (four) hours as needed for wheezing or shortness of breath. 1 Inhaler 5  . amitriptyline (ELAVIL) 75 MG tablet Take 75 mg by mouth at bedtime.    Marland Kitchen atorvastatin (LIPITOR) 10 MG tablet Take 10 mg by mouth daily.    Marland Kitchen lisinopril (PRINIVIL,ZESTRIL) 10 MG tablet Take 10 mg by mouth daily.     . prochlorperazine (COMPAZINE) 10 MG tablet Take 1 tablet (10 mg total) by mouth every 6 (six) hours as needed for nausea or vomiting. (Patient not taking: Reported on 03/16/2018) 30 tablet 0   No current facility-administered medications for this visit.      Allergies: No Known Allergies  Past Medical History, Surgical history, Social history, and Family History reviewed is unchanged.  :  Physical Exam:  Blood pressure (!) 149/91, pulse (!) 109, temperature 98.4 F (36.9 C), temperature source Oral, resp. rate 19, height 5\' 8"  (1.727 m), weight 163 lb 11.2 oz (74.3 kg), SpO2 97 %.   ECOG: 0 General appearance: Alert, awake gentleman without distress. Head: Atraumatic without abnormalities. Oropharynx: No oral thrush or ulcers. Eyes: Sclera anicteric.  Pupils are equal and round reactive to light. Lymph nodes: No lymphadenopathy noted on exam in the cervical, axillary and supraclavicular regions. Heart: Regular rate and rhythm.  S1 and S2 without any murmurs or  gallops.  No leg edema. Lung: Clear to auscultation without any rhonchi, wheezes or dullness to percussion. Abdomin: Soft, without any rebound or guarding.  No shifting dullness or ascites. Neurological: No deficits noted on exam today. Skin: Skin is moist without any ecchymosis or petechiae. Musculoskeletal: No clubbing or cyanosis.     Lab Results: Lab Results  Component Value Date   WBC 6.1 07/04/2018   HGB 13.8 07/04/2018   HCT 40.8 07/04/2018   MCV 96.3  07/04/2018   PLT 414 (H) 07/04/2018     Chemistry      Component Value Date/Time   NA 137 07/04/2018 0842   K 5.0 07/04/2018 0842   CL 101 07/04/2018 0842   CO2 28 07/04/2018 0842   BUN 17 07/04/2018 0842   CREATININE 1.04 07/04/2018 0842   CREATININE 0.88 05/23/2013 1422      Component Value Date/Time   CALCIUM 10.2 07/04/2018 0842   ALKPHOS 74 07/04/2018 0842   AST 22 07/04/2018 0842   ALT 12 07/04/2018 0842   BILITOT 0.5 07/04/2018 0842      Results for Anthony Escobar, Anthony Escobar (MRN [id number]) as of 07/06/2018 08:01  Ref. Range 04/27/2018 08:34 05/18/2018 08:05 07/04/2018 08:42  Prostate Specific Ag, Serum Latest Ref Range: 0.0 - 4.0 ng/mL 1.1 0.9 0.8   EXAM: CT CHEST, ABDOMEN, AND PELVIS WITH CONTRAST  TECHNIQUE: Multidetector CT imaging of the chest, abdomen and pelvis was performed following the standard protocol during bolus administration of intravenous contrast.  CONTRAST:  133mL ISOVUE-300 IOPAMIDOL (ISOVUE-300) INJECTION 61%  COMPARISON:  PET-CT 11/05/2017  FINDINGS: CT CHEST FINDINGS  Cardiovascular: Coronary artery calcification and aortic atherosclerotic calcification.  Mediastinum/Nodes: No axillary or supraclavicular adenopathy. No mediastinal hilar adenopathy. No pericardial effusion. Esophagus normal.  Lungs/Pleura: Resolution of many of the pulmonary nodule seen on comparison exam. Several nodules remain which are reduced in size.  For example 5 mm nodule in the RIGHT lower lobe (image 78/7) decreased from 8 mm.  4 mm nodule in the lingula is decreased from 9 mm (image 96/7). No new pulmonary nodules.  Musculoskeletal: Degenerate change in the cervical and thoracic spine.  CT ABDOMEN AND PELVIS FINDINGS  Hepatobiliary: No focal hepatic lesion. No biliary ductal dilatation. Gallbladder is normal. Common bile duct is normal.  Pancreas: Pancreas is normal. No ductal dilatation. No pancreatic inflammation.  Spleen: Normal  spleen  Adrenals/urinary tract: Adrenal glands normal. Several small hypodense lesions in the kidneys favored benign cysts. Ureters and bladder normal  Stomach/Bowel: Stomach, small bowel, appendix, and cecum are normal. There is thickening through the sigmoid colon which is favored nondistention. There multiple diverticular through this region. No bowel obstruction  Vascular/Lymphatic: Abdominal aorta is normal caliber with atherosclerotic calcification. There is no retroperitoneal or periportal lymphadenopathy. No pelvic lymphadenopathy. No pelvic lymphadenopathy.  Reproductive: Prostate grossly normal by CT  Other: No free fluid.  Musculoskeletal: Sclerosis of the intertrochanteric RIGHT femur increased from comparison CT. This is hypermetabolic comparison PET-CT scan. No new lesions identified.  IMPRESSION: 1. Marked improvement in pulmonary metastasis. Many of the nodules are no longer evident. Remaining nodules are smaller. 2. Resolution of pelvic lymphadenopathy. 3. Increased sclerosis of proximal RIGHT metastasis most consistent with treatment response. 4. No CT evidence of disease progression.    Impression and Plan:  67 year old man with the following issues:  1.   Castration-sensitive prostate cancer with documented disease to the lung and limited disease to the bone.  He completed 6 cycles of Taxotere chemotherapy in addition to  androgen deprivation without complication.  His PSA continues to show excellent response currently down to 0.8.  CT scan obtained on 07/04/2018 showed positive response to therapy of his visceral disease including his pulmonary metastasis and lymphadenopathy.  His bone scan also showed positive response to therapy in the proximal right femur.  The natural course of this disease was reviewed again and treatment approaches were discussed.  Options of therapy includes continued observation and surveillance or adding second line  hormone therapy in the form of Zytiga or  Xtandi.  The plan at this time is to continue with androgen deprivation only and add second line hormone therapy if his PSA starts to rise.    2.  Bone pain: Has improved since the start of therapy.  Radiation therapy can be an option in the future for further palliation.  He is no longer reporting any pain at this time.  3.  Bone directed therapy: He has limited disease to the bone.  I asked him to start calcium and vitamin D supplements and consider Xgeva after obtaining dental clearance.  4.  Androgen depravation: He will continue on Lupron every 4 months and his next injection scheduled in September.  5.  Prognosis: His disease is incurable but have responded very well to treatment and the plan is to continue with aggressive measures.  6.  Follow-up: We will be in 2 months to follow his progress and repeat PSA.   15  minutes was spent with the patient face-to-face today.  More than 50% of time was dedicated to patient counseling, education and discussing his disease course and future treatment options.    Zola Button, MD 7/11/20197:51 AM

## 2018-07-07 ENCOUNTER — Telehealth: Payer: Self-pay

## 2018-07-07 NOTE — Telephone Encounter (Signed)
Unable to leave a voice message for patient due to voice mail was not set up yet. Will mail a letter and calender enclosed of these new appointment. Per 7/11 los

## 2018-08-31 ENCOUNTER — Inpatient Hospital Stay: Payer: Medicare Other | Admitting: Oncology

## 2018-08-31 ENCOUNTER — Inpatient Hospital Stay: Payer: Medicare Other

## 2018-08-31 ENCOUNTER — Inpatient Hospital Stay: Payer: Medicare Other | Attending: Oncology

## 2018-09-27 ENCOUNTER — Telehealth: Payer: Self-pay | Admitting: Oncology

## 2018-09-27 ENCOUNTER — Telehealth: Payer: Self-pay | Admitting: *Deleted

## 2018-09-27 NOTE — Telephone Encounter (Signed)
Scheduled apt per 10/2 sch message - pt is aware of appt date and time

## 2018-09-27 NOTE — Telephone Encounter (Signed)
Message to scheduling please.

## 2018-09-27 NOTE — Telephone Encounter (Signed)
Received voice mail message from patient stating,"I think I missed my lab, MD and injection appointment this month. I need to reschedule these. Return number is [phone]." (he was scheduled for 08/31/18).

## 2018-09-29 ENCOUNTER — Inpatient Hospital Stay: Payer: Medicare Other | Attending: Oncology

## 2018-09-29 ENCOUNTER — Encounter: Payer: Self-pay | Admitting: Oncology

## 2018-09-29 ENCOUNTER — Inpatient Hospital Stay: Payer: Medicare Other

## 2018-09-29 ENCOUNTER — Telehealth: Payer: Self-pay | Admitting: Oncology

## 2018-09-29 ENCOUNTER — Inpatient Hospital Stay (HOSPITAL_BASED_OUTPATIENT_CLINIC_OR_DEPARTMENT_OTHER): Payer: Medicare Other | Admitting: Oncology

## 2018-09-29 VITALS — BP 126/85 | HR 85 | Temp 98.1°F | Resp 18 | Ht 68.0 in | Wt 154.5 lb

## 2018-09-29 DIAGNOSIS — Z5111 Encounter for antineoplastic chemotherapy: Secondary | ICD-10-CM | POA: Diagnosis present

## 2018-09-29 DIAGNOSIS — Z79899 Other long term (current) drug therapy: Secondary | ICD-10-CM | POA: Insufficient documentation

## 2018-09-29 DIAGNOSIS — C61 Malignant neoplasm of prostate: Secondary | ICD-10-CM

## 2018-09-29 DIAGNOSIS — C7951 Secondary malignant neoplasm of bone: Secondary | ICD-10-CM | POA: Diagnosis not present

## 2018-09-29 DIAGNOSIS — Z9221 Personal history of antineoplastic chemotherapy: Secondary | ICD-10-CM | POA: Insufficient documentation

## 2018-09-29 DIAGNOSIS — C7800 Secondary malignant neoplasm of unspecified lung: Secondary | ICD-10-CM | POA: Insufficient documentation

## 2018-09-29 LAB — CBC WITH DIFFERENTIAL (CANCER CENTER ONLY)
Basophils Absolute: 0 10*3/uL (ref 0.0–0.1)
Basophils Relative: 1 %
EOS ABS: 0.3 10*3/uL (ref 0.0–0.5)
Eosinophils Relative: 5 %
HCT: 40.1 % (ref 38.4–49.9)
HEMOGLOBIN: 13.6 g/dL (ref 13.0–17.1)
LYMPHS ABS: 1.3 10*3/uL (ref 0.9–3.3)
LYMPHS PCT: 24 %
MCH: 33.5 pg — AB (ref 27.2–33.4)
MCHC: 33.9 g/dL (ref 32.0–36.0)
MCV: 98.7 fL — ABNORMAL HIGH (ref 79.3–98.0)
MONOS PCT: 13 %
Monocytes Absolute: 0.7 10*3/uL (ref 0.1–0.9)
NEUTROS PCT: 57 %
Neutro Abs: 3.1 10*3/uL (ref 1.5–6.5)
Platelet Count: 459 10*3/uL — ABNORMAL HIGH (ref 140–400)
RBC: 4.06 MIL/uL — AB (ref 4.20–5.82)
RDW: 14 % (ref 11.0–14.6)
WBC: 5.4 10*3/uL (ref 4.0–10.3)

## 2018-09-29 LAB — CMP (CANCER CENTER ONLY)
ALT: 14 U/L (ref 0–44)
AST: 19 U/L (ref 15–41)
Albumin: 4 g/dL (ref 3.5–5.0)
Alkaline Phosphatase: 95 U/L (ref 38–126)
Anion gap: 9 (ref 5–15)
BUN: 21 mg/dL (ref 8–23)
CHLORIDE: 104 mmol/L (ref 98–111)
CO2: 25 mmol/L (ref 22–32)
CREATININE: 0.99 mg/dL (ref 0.61–1.24)
Calcium: 10 mg/dL (ref 8.9–10.3)
Glucose, Bld: 96 mg/dL (ref 70–99)
POTASSIUM: 4.8 mmol/L (ref 3.5–5.1)
Sodium: 138 mmol/L (ref 135–145)
Total Bilirubin: 0.4 mg/dL (ref 0.3–1.2)
Total Protein: 7.8 g/dL (ref 6.5–8.1)

## 2018-09-29 MED ORDER — LEUPROLIDE ACETATE (4 MONTH) 30 MG IM KIT
30.0000 mg | PACK | Freq: Once | INTRAMUSCULAR | Status: AC
  Administered 2018-09-29: 30 mg via INTRAMUSCULAR
  Filled 2018-09-29: qty 30

## 2018-09-29 NOTE — Progress Notes (Signed)
Hematology and Oncology Follow Up Visit  Anthony Escobar [id number] [birth year] 67 y.o. 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  His Gleason score 4+4 = 8 and PSA of 19.57 at the time of diagnosis.    Prior Therapy:  He is status post prostate biopsy on December 29, 2017 showed a Gleason score 4+4 = 8 and 5 cores.   Taxotere chemotherapy with the first cycle of chemotherapy given on February 02, 2018.  He completed 6 cycles of therapy on May 18, 2018.  Current therapy:  Androgen deprivation therapy with Lupron.  Interim History: Anthony Escobar presents today for a follow-up.  He missed his appointment with [place] in September.  He reports that he has been feeling well.  He continues to be active and attends to activities of daily living.  He has lost weight intentionally currently on 154 pounds.  He denies any bone pain or hip pain.  He denies any falls or syncope.  His performance status and activity level remains excellent.   He does not report any headaches, blurry vision, or seizures.  He denies any residual neuropathy..  Does not report any fevers, chills or sweats.  Does not report any cough, wheezing or hemoptysis.  Does not report any chest pain, palpitation, orthopnea or leg edema.  Does not report any vomiting.  Does not report any change in his bowel habits.   He denies any early satiety.  Does not report any bone pain.  He denies any arthralgias or myalgias.  Does not report hematuria or dysuria.  Does not report any skin rashes or lesions. Does not report any skin rashes or lesions.  He denies any easy bruising or bleeding tendencies.  Does not report any anxiety or depression.  Remaining review of systems is negative.    Medications: I have reviewed the patient's current medications.  Current Outpatient Medications  Medication Sig  Dispense Refill  . albuterol (PROAIR HFA) 108 (90 Base) MCG/ACT inhaler Inhale 2 puffs into the lungs every 4 (four) hours as needed for wheezing or shortness of breath. 1 Inhaler 5  . amitriptyline (ELAVIL) 75 MG tablet Take 75 mg by mouth at bedtime.    Marland Kitchen atorvastatin (LIPITOR) 10 MG tablet Take 10 mg by mouth daily.    Marland Kitchen lisinopril (PRINIVIL,ZESTRIL) 10 MG tablet Take 10 mg by mouth daily.     . prochlorperazine (COMPAZINE) 10 MG tablet Take 1 tablet (10 mg total) by mouth every 6 (six) hours as needed for nausea or vomiting. 30 tablet 0   No current facility-administered medications for this visit.      Allergies: No Known Allergies  Past Medical History, Surgical history, Social history, and Family History reviewed is unchanged.  Physical Exam:  Blood pressure 126/85, pulse 85, temperature 98.1 F (36.7 C), temperature source Oral, resp. rate 18, height 5\' 8"  (1.727 m), weight 154 lb 8 oz (70.1 kg), SpO2 99 %.  ECOG: 0 General appearance: Alert, awake gentleman without distress. Head: Atraumatic without abnormalities. Oropharynx: No oral thrush or ulcers. Eyes: Sclera anicteric.  Pupils are equal and round reactive to light. Lymph nodes: No lymphadenopathy noted on exam in the cervical, axillary and supraclavicular regions. Heart: Regular rate and rhythm.  S1 and S2 without any murmurs or gallops.  No leg edema. Lung: Clear to auscultation without any rhonchi, wheezes or dullness to percussion. Abdomen:  Soft, without any rebound or guarding.  No shifting dullness or ascites. Neurological: No deficits noted on exam today. Skin: Skin is moist without any ecchymosis or petechiae. Musculoskeletal: No clubbing or cyanosis.  Lab Results: Lab Results  Component Value Date   WBC 5.4 09/29/2018   HGB 13.6 09/29/2018   HCT 40.1 09/29/2018   MCV 98.7 (H) 09/29/2018   PLT 459 (H) 09/29/2018     Chemistry      Component Value Date/Time   NA 138 09/29/2018 0926   K 4.8 09/29/2018  0926   CL 104 09/29/2018 0926   CO2 25 09/29/2018 0926   BUN 21 09/29/2018 0926   CREATININE 0.99 09/29/2018 0926   CREATININE 0.88 05/23/2013 1422      Component Value Date/Time   CALCIUM 10.0 09/29/2018 0926   ALKPHOS 95 09/29/2018 0926   AST 19 09/29/2018 0926   ALT 14 09/29/2018 0926   BILITOT 0.4 09/29/2018 0926      Results for Anthony, Escobar (MRN [id number]) as of 09/29/2018 12:54  Ref. Range 03/16/2018 08:52 04/06/2018 09:17 04/27/2018 08:34 05/18/2018 08:05 07/04/2018 08:42  Prostate Specific Ag, Serum Latest Ref Range: 0.0 - 4.0 ng/mL 0.9 0.9 1.1 0.9 0.8    EXAM: CT CHEST, ABDOMEN, AND PELVIS WITH CONTRAST  TECHNIQUE: Multidetector CT imaging of the chest, abdomen and pelvis was performed following the standard protocol during bolus administration of intravenous contrast.  CONTRAST:  149mL ISOVUE-300 IOPAMIDOL (ISOVUE-300) INJECTION 61%  COMPARISON:  PET-CT 11/05/2017  FINDINGS: CT CHEST FINDINGS  Cardiovascular: Coronary artery calcification and aortic atherosclerotic calcification.  Mediastinum/Nodes: No axillary or supraclavicular adenopathy. No mediastinal hilar adenopathy. No pericardial effusion. Esophagus normal.  Lungs/Pleura: Resolution of many of the pulmonary nodule seen on comparison exam. Several nodules remain which are reduced in size.  For example 5 mm nodule in the RIGHT lower lobe (image 78/7) decreased from 8 mm.  4 mm nodule in the lingula is decreased from 9 mm (image 96/7). No new pulmonary nodules.  Musculoskeletal: Degenerate change in the cervical and thoracic spine.  CT ABDOMEN AND PELVIS FINDINGS  Hepatobiliary: No focal hepatic lesion. No biliary ductal dilatation. Gallbladder is normal. Common bile duct is normal.  Pancreas: Pancreas is normal. No ductal dilatation. No pancreatic inflammation.  Spleen: Normal spleen  Adrenals/urinary tract: Adrenal glands normal. Several small hypodense lesions in the  kidneys favored benign cysts. Ureters and bladder normal  Stomach/Bowel: Stomach, small bowel, appendix, and cecum are normal. There is thickening through the sigmoid colon which is favored nondistention. There multiple diverticular through this region. No bowel obstruction  Vascular/Lymphatic: Abdominal aorta is normal caliber with atherosclerotic calcification. There is no retroperitoneal or periportal lymphadenopathy. No pelvic lymphadenopathy. No pelvic lymphadenopathy.  Reproductive: Prostate grossly normal by CT  Other: No free fluid.  Musculoskeletal: Sclerosis of the intertrochanteric RIGHT femur increased from comparison CT. This is hypermetabolic comparison PET-CT scan. No new lesions identified.  IMPRESSION: 1. Marked improvement in pulmonary metastasis. Many of the nodules are no longer evident. Remaining nodules are smaller. 2. Resolution of pelvic lymphadenopathy. 3. Increased sclerosis of proximal RIGHT metastasis most consistent with treatment response. 4. No CT evidence of disease progression.  Impression and Plan:  67 year old man with the following issues:  1.   Castration-sensitive prostate cancer with documented disease to the lung and limited disease to the bone.  He completed 6 cycles of Taxotere chemotherapy in addition to androgen deprivation without complication.  His PSA showed an excellent response and was down to  0.8 as of 07/04/2018.  CT scan obtained on 07/04/2018 showed positive response to therapy of his visceral disease including his pulmonary metastasis and lymphadenopathy.  His bone scan also showed positive response to therapy in the proximal right femur.  The natural course of this disease and treatment approaches were discussed previously with the patient.  He is currently on observation and surveillance.  The plan at this time is to continue with androgen deprivation only and add second line hormone therapy if his PSA starts to rise.   PSA from today is pending.  2.  Bone pain: Has improved since the start of therapy.  Radiation therapy can be an option in the future for further palliation.  He is no longer reporting any pain at this time.  3.  Bone directed therapy: He has limited disease to the bone.  Continue calcium and vitamin D supplements and consider Xgeva after obtaining dental clearance.  4.  Androgen depravation: He will continue on Lupron every 4 months.  He will receive this today.  5.  Prognosis: His disease is incurable but have responded very well to treatment and the plan is to continue with aggressive measures.  6.  Follow-up: We will be in 2 months to follow his progress and repeat PSA.  Mikey Bussing, DNP, AGPCNP-BC, AOCNP 10/4/20199:56 AM

## 2018-09-29 NOTE — Patient Instructions (Signed)

## 2018-09-29 NOTE — Telephone Encounter (Signed)
Appts scheduled avs/calendar declined due to my chart per 10/4 los

## 2018-09-30 LAB — PROSTATE-SPECIFIC AG, SERUM (LABCORP): Prostate Specific Ag, Serum: 3.2 ng/mL (ref 0.0–4.0)

## 2018-11-29 ENCOUNTER — Ambulatory Visit: Payer: Medicare Other | Admitting: Oncology

## 2018-11-29 ENCOUNTER — Other Ambulatory Visit: Payer: Medicare Other

## 2018-12-15 ENCOUNTER — Ambulatory Visit: Payer: Medicare Other | Admitting: Emergency Medicine

## 2018-12-15 ENCOUNTER — Encounter: Payer: Self-pay | Admitting: *Deleted

## 2018-12-15 ENCOUNTER — Encounter: Payer: Self-pay | Admitting: Emergency Medicine

## 2018-12-15 DIAGNOSIS — J449 Chronic obstructive pulmonary disease, unspecified: Secondary | ICD-10-CM | POA: Diagnosis not present

## 2018-12-15 DIAGNOSIS — J209 Acute bronchitis, unspecified: Secondary | ICD-10-CM

## 2018-12-15 MED ORDER — TIOTROPIUM BROMIDE MONOHYDRATE 2.5 MCG/ACT IN AERS: 2.0000 | INHALATION_SPRAY | Freq: Every day | RESPIRATORY_TRACT | 0 refills | Status: AC

## 2018-12-15 MED ORDER — DOXYCYCLINE HYCLATE 100 MG PO TABS: 100.0000 mg | ORAL_TABLET | Freq: Two times a day (BID) | ORAL | 0 refills | Status: DC

## 2018-12-15 NOTE — Progress Notes (Signed)
Subjective:    Patient ID: Anthony Escobar, male    DOB: 12-05-1951, 67 y.o.   MRN: [id number]  COPD  He complains of shortness of breath. There is no cough or wheezing. Pertinent negatives include no ear pain, fever, headaches, postnasal drip, rhinorrhea, sneezing, sore throat or trouble swallowing. His past medical history is significant for COPD.    ROV 12/15/18 --Anthony Escobar is 10, former smoker (38 pk-yrs) with COPD, severe obstruction and a borderline bronchodilator response.  He has a history of metastatic prostate cancer, associated pulmonary nodular disease.  He is currently being treated with Lupron, finished Taxotere.  We have been managing him with only albuterol as needed.  We had discussed starting Spiriva but he is never been on it.  He reports today that he has been experiencing URI sx x 1 week, cough productive of now brownish phlegm for the last couple days. He is on an ACE-I.  No wheeze.   Review of Systems  Constitutional: Negative for fever and unexpected weight change.  HENT: Negative for congestion, dental problem, ear pain, nosebleeds, postnasal drip, rhinorrhea, sinus pressure, sneezing, sore throat and trouble swallowing.   Eyes: Negative for redness and itching.  Respiratory: Positive for chest tightness and shortness of breath. Negative for cough and wheezing.   Cardiovascular: Negative for palpitations and leg swelling.  Gastrointestinal: Negative for nausea and vomiting.  Genitourinary: Negative for dysuria.  Musculoskeletal: Negative for joint swelling.  Skin: Negative for rash.  Neurological: Negative for headaches.  Hematological: Does not bruise/bleed easily.  Psychiatric/Behavioral: Negative for dysphoric mood. The patient is not nervous/anxious.     Past Medical History:  Diagnosis Date  . Hyperlipidemia   . Hypertension      Family History  Problem Relation Age of Onset  . Stroke Mother   . Cancer Father        esophageal     Social History    Socioeconomic History  . Marital status: Single    Spouse name: Not on file  . Number of children: Not on file  . Years of education: Not on file  . Highest education level: Not on file  Occupational History  . Not on file  Social Needs  . Financial resource strain: Not on file  . Food insecurity:    Worry: Not on file    Inability: Not on file  . Transportation needs:    Medical: Not on file    Non-medical: Not on file  Tobacco Use  . Smoking status: Former Smoker    Packs/day: 1.50    Years: 50.00    Pack years: 75.00    Types: Cigarettes    Last attempt to quit: 08/20/2011    Years since quitting: 7.3  . Smokeless tobacco: Never Used  Substance and Sexual Activity  . Alcohol use: Yes    Comment: 1beer a day  . Drug use: No  . Sexual activity: Not on file  Lifestyle  . Physical activity:    Days per week: Not on file    Minutes per session: Not on file  . Stress: Not on file  Relationships  . Social connections:    Talks on phone: Not on file    Gets together: Not on file    Attends religious service: Not on file    Active member of club or organization: Not on file    Attends meetings of clubs or organizations: Not on file    Relationship status: Not on file  .  Intimate partner violence:    Fear of current or ex partner: Not on file    Emotionally abused: Not on file    Physically abused: Not on file    Forced sexual activity: Not on file  Other Topics Concern  . Not on file  Social History Narrative  . Not on file  Has worked Therapist, music, Garment/textile technologist No fumes, powders No [employer]   native.   No Known Allergies   Outpatient Medications Prior to Visit  Medication Sig Dispense Refill  . albuterol (PROAIR HFA) 108 (90 Base) MCG/ACT inhaler Inhale 2 puffs into the lungs every 4 (four) hours as needed for wheezing or shortness of breath. 1 Inhaler 5  . amitriptyline (ELAVIL) 75 MG tablet Take 75 mg by mouth at bedtime.    Marland Kitchen atorvastatin (LIPITOR)  10 MG tablet Take 10 mg by mouth daily.    Marland Kitchen lisinopril (PRINIVIL,ZESTRIL) 10 MG tablet Take 10 mg by mouth daily.     . prochlorperazine (COMPAZINE) 10 MG tablet Take 1 tablet (10 mg total) by mouth every 6 (six) hours as needed for nausea or vomiting. 30 tablet 0   No facility-administered medications prior to visit.         Objective:   Physical Exam Vitals:   12/15/18 1515  BP: (!) 122/2  Pulse: 100  SpO2: 98%  Weight: 155 lb (70.3 kg)  Height: 5\' 8"  (1.727 m)   Gen: Pleasant, well-nourished, in no distress,  normal affect  ENT: No lesions,  mouth clear,  oropharynx clear, no postnasal drip  Neck: No JVD, no stridor  Lungs: No use of accessory muscles, clear during a normal respiratory cycle, no wheezing.  Very soft end expiratory wheeze on forced expiration  Cardiovascular: RRR, heart sounds normal, no murmur or gallops, no peripheral edema  Musculoskeletal: No deformities, no cyanosis or clubbing  Neuro: alert, non focal  Skin: Warm, no lesions or rashes     Assessment & Plan:  Acute bronchitis Trigger seems to have been a viral upper respiratory infection.  He is having brown mucus.  No wheezing currently.  I think we can hold off on prednisone, will treat him with doxycycline for 7 days.  COPD (chronic obstructive pulmonary disease) (HCC) Currently not using scheduled medication.  His functional capacity is actually quite good but I think it would be reasonable to retry a schedule bronchodilator.  We will start Spiriva Respimat to see if he gets benefit.  He will call us to let us know.  In the meantime refill his albuterol to use as needed.   We will try starting Spiriva Respimat, 2 sprays once daily.  Take this until the samples are gone.  Call us to let us know if the medication has helped you.  If so then we will send a prescription for this to your pharmacy. Please keep your albuterol available to use 2 puffs up to every 4 hours if you needed for shortness of  breath, chest tightness, wheezing.  We will refill this for you. Flu shot and pneumonia shot up-to-date Follow with Dr Lamonte Sakai in 6 months or sooner if you have any problems  Baltazar Apo, MD, PhD 12/15/2018, 3:38 PM [facility] [phone] or if no answer [phone]

## 2018-12-15 NOTE — Assessment & Plan Note (Signed)
Trigger seems to have been a viral upper respiratory infection.  He is having brown mucus.  No wheezing currently.  I think we can hold off on prednisone, will treat him with doxycycline for 7 days.

## 2018-12-15 NOTE — Progress Notes (Signed)
Patient seen in the office today and instructed on use of Spiriva Respimat 2.99mcg.  Patient expressed understanding and demonstrated technique.

## 2018-12-15 NOTE — Assessment & Plan Note (Signed)
Currently not using scheduled medication.  His functional capacity is actually quite good but I think it would be reasonable to retry a schedule bronchodilator.  We will start Spiriva Respimat to see if he gets benefit.  He will call us to let us know.  In the meantime refill his albuterol to use as needed.   We will try starting Spiriva Respimat, 2 sprays once daily.  Take this until the samples are gone.  Call us to let us know if the medication has helped you.  If so then we will send a prescription for this to your pharmacy. Please keep your albuterol available to use 2 puffs up to every 4 hours if you needed for shortness of breath, chest tightness, wheezing.  We will refill this for you. Flu shot and pneumonia shot up-to-date Follow with Dr Lamonte Sakai in 6 months or sooner if you have any problems

## 2018-12-15 NOTE — Patient Instructions (Signed)
Please take doxycycline 100 mg twice a day for 7 days.  Take this until is completely gone. We will try starting Spiriva Respimat, 2 sprays once daily.  Take this until the samples are gone.  Call us to let us know if the medication has helped you.  If so then we will send a prescription for this to your pharmacy. Please keep your albuterol available to use 2 puffs up to every 4 hours if you needed for shortness of breath, chest tightness, wheezing.  We will refill this for you. Flu shot and pneumonia shot up-to-date Follow with Dr Lamonte Sakai in 6 months or sooner if you have any problems

## 2019-02-20 ENCOUNTER — Telehealth: Payer: Self-pay

## 2019-02-20 ENCOUNTER — Telehealth: Payer: Self-pay | Admitting: Pharmacist

## 2019-02-20 ENCOUNTER — Inpatient Hospital Stay: Payer: Medicare Other | Admitting: Oncology

## 2019-02-20 ENCOUNTER — Inpatient Hospital Stay: Payer: Medicare Other | Attending: Oncology

## 2019-02-20 ENCOUNTER — Telehealth: Payer: Self-pay | Admitting: Oncology

## 2019-02-20 ENCOUNTER — Inpatient Hospital Stay: Payer: Medicare Other

## 2019-02-20 VITALS — BP 127/87 | HR 93 | Temp 97.9°F | Resp 18 | Ht 68.0 in | Wt 153.1 lb

## 2019-02-20 DIAGNOSIS — C61 Malignant neoplasm of prostate: Secondary | ICD-10-CM | POA: Diagnosis not present

## 2019-02-20 DIAGNOSIS — Z79899 Other long term (current) drug therapy: Secondary | ICD-10-CM | POA: Insufficient documentation

## 2019-02-20 DIAGNOSIS — C7800 Secondary malignant neoplasm of unspecified lung: Secondary | ICD-10-CM | POA: Insufficient documentation

## 2019-02-20 DIAGNOSIS — C7951 Secondary malignant neoplasm of bone: Secondary | ICD-10-CM

## 2019-02-20 DIAGNOSIS — I10 Essential (primary) hypertension: Secondary | ICD-10-CM | POA: Insufficient documentation

## 2019-02-20 DIAGNOSIS — Z5111 Encounter for antineoplastic chemotherapy: Secondary | ICD-10-CM | POA: Diagnosis not present

## 2019-02-20 LAB — CBC WITH DIFFERENTIAL (CANCER CENTER ONLY)
Abs Immature Granulocytes: 0.02 10*3/uL (ref 0.00–0.07)
Basophils Absolute: 0 10*3/uL (ref 0.0–0.1)
Basophils Relative: 0 %
EOS PCT: 3 %
Eosinophils Absolute: 0.2 10*3/uL (ref 0.0–0.5)
HEMATOCRIT: 38.3 % — AB (ref 39.0–52.0)
HEMOGLOBIN: 12.4 g/dL — AB (ref 13.0–17.0)
Immature Granulocytes: 0 %
LYMPHS ABS: 1.3 10*3/uL (ref 0.7–4.0)
LYMPHS PCT: 23 %
MCH: 32.4 pg (ref 26.0–34.0)
MCHC: 32.4 g/dL (ref 30.0–36.0)
MCV: 100 fL (ref 80.0–100.0)
MONO ABS: 0.6 10*3/uL (ref 0.1–1.0)
MONOS PCT: 10 %
Neutro Abs: 3.8 10*3/uL (ref 1.7–7.7)
Neutrophils Relative %: 64 %
Platelet Count: 417 10*3/uL — ABNORMAL HIGH (ref 150–400)
RBC: 3.83 MIL/uL — AB (ref 4.22–5.81)
RDW: 12.8 % (ref 11.5–15.5)
WBC: 5.9 10*3/uL (ref 4.0–10.5)
nRBC: 0 % (ref 0.0–0.2)

## 2019-02-20 LAB — CMP (CANCER CENTER ONLY)
ALK PHOS: 174 U/L — AB (ref 38–126)
ALT: 12 U/L (ref 0–44)
ANION GAP: 10 (ref 5–15)
AST: 21 U/L (ref 15–41)
Albumin: 3.8 g/dL (ref 3.5–5.0)
BILIRUBIN TOTAL: 0.4 mg/dL (ref 0.3–1.2)
BUN: 16 mg/dL (ref 8–23)
CALCIUM: 9.5 mg/dL (ref 8.9–10.3)
CO2: 25 mmol/L (ref 22–32)
CREATININE: 0.82 mg/dL (ref 0.61–1.24)
Chloride: 104 mmol/L (ref 98–111)
GFR, Estimated: >60 mL/min (ref >60)
Glucose, Bld: 90 mg/dL (ref 70–99)
Potassium: 4.4 mmol/L (ref 3.5–5.1)
Sodium: 139 mmol/L (ref 135–145)
TOTAL PROTEIN: 7.3 g/dL (ref 6.5–8.1)

## 2019-02-20 MED ORDER — LEUPROLIDE ACETATE (4 MONTH) 30 MG IM KIT
30.0000 mg | PACK | Freq: Once | INTRAMUSCULAR | Status: AC
  Administered 2019-02-20: 30 mg via INTRAMUSCULAR
  Filled 2019-02-20: qty 30

## 2019-02-20 MED ORDER — ENZALUTAMIDE 40 MG PO CAPS: 160.0000 mg | ORAL_CAPSULE | Freq: Every day | ORAL | 0 refills | Status: DC

## 2019-02-20 NOTE — Telephone Encounter (Signed)
Oral Oncology Patient Advocate Encounter  Prior Authorization for Anthony Escobar has been approved.    PA# [id number] Effective dates: 02/20/19 through 12/27/19  Oral Oncology Clinic will continue to follow.   [place] Patient [place] Phone [phone] Fax [phone] 02/20/2019    10:21 AM

## 2019-02-20 NOTE — Telephone Encounter (Signed)
Oral Oncology Pharmacist Encounter  Received new prescription for Xtandi (enzalutamide) for the treatment of metastatic, castration resistant prostate cancer in conjunction with androgen deprivation therapy (Lupron injections), planned duration until disease progression or unacceptable toxicity.  Original diagnosis in January 2019 and was started on androgen deprivation therapy at that time Patient was started on Taxotere chemotherapy x6 cycles from February to May 2019  Labs from 02/20/2019 assessed, OK for treatment. BPs in Epic reviewed, most readings WNL, will continue to be monitored on treatment with Xtandi  Current medication list in Epic reviewed, DDI with Lipitor and Xtandi identified:  Category D interaction: Gillermina Phy is an inducer of CYP3A4 thereby leading to increased metabolism and decrease systemic exposure to the patient's Lipitor.  Lipitor dose very low at 10 mg once daily, patient will be monitored for increase in exposure to Lipitor.  No change to current therapy is indicated at this time.  Prescription has been e-scribed to the [facility] for benefits analysis and approval.  Oral Oncology Clinic will continue to follow for insurance authorization, copayment issues, initial counseling and start date.  Johny Drilling, PharmD, BCPS, BCOP  02/20/2019 8:45 AM Oral Oncology Clinic [phone]

## 2019-02-20 NOTE — Telephone Encounter (Signed)
Oral Oncology Patient Advocate Lauro Regulus copay is $2800.44. There are currently no grant funds available for the patients disease state. I can help him apply for manufacturer assistance. I spoke to the patient while he was in the office today, he was tearful when speaking about the cost of the medicine. I assured him that I had ways I could help him get the medicine free of charge. I explained to him that there are no grants available right now but I did give him the details on how a grant worked and that I would watch for one to open while we wait for Xtandi to respond. I filled out a manufacturer assistance application and explained to him how that worked and he agreed to proceed, he signed the form today. I explained that it could take a week or 2 before we hear from [place] support solutions.  The patient verbalized understanding and great appreciation.  I faxed the completed application today.  [place] Patient [place] Phone [phone] Fax [phone] 02/20/2019   1:47 PM

## 2019-02-20 NOTE — Patient Instructions (Signed)

## 2019-02-20 NOTE — Telephone Encounter (Signed)
Oral Oncology Patient Advocate Encounter  Received notification from [employer] that prior authorization for Gillermina Phy is required.  PA submitted on CoverMyMeds Key [id number] Status is pending  Oral Oncology Clinic will continue to follow.  [place] Patient [place] Phone [phone] Fax [phone] 02/20/2019    10:14 AM

## 2019-02-20 NOTE — Telephone Encounter (Signed)
Gave patient avs report and appointments for April. Per Dr. Alen Blew patient to return in April not May. Central radiology will call patient re scan.

## 2019-02-20 NOTE — Telephone Encounter (Signed)
Oral Chemotherapy Pharmacist Encounter   I spoke with patient in [facility] lobby for overview of: Xtandi (enzalutamide) for the treatment of metastatic, castration resistant prostate cancer in conjunction with androgen deprivation therapy (Lupron injections), planned duration until disease progression or unacceptable toxicity.   Counseled patient on administration, dosing, side effects, monitoring, drug-food interactions, safe handling, storage, and disposal.  Patient will take Xtandi 40mg  capsules, 4 capsules (160mg ) by mouth once daily without regard to food.  Xtandi start date: TBD, pending medication acquisition  Adverse effects include but are not limited to: peripheral edema, GI upset, hypertension, hot flashes, fatigue, falls/fractures, and arthralgias.   Patient instructed about small risk of seizures with Xtandi treatment.  Reviewed with patient importance of keeping a medication schedule and plan for any missed doses.  Anthony Escobar voiced understanding and appreciation.   All questions answered. Medication reconciliation performed and medication/allergy list updated.  We extensively discussed medicare copayment and possibility for high copayment. We discussed options for copayment assistance including copayment grant foundations and manufacturer compassionate use program. Patient instructed that if they are enrolled into manufacturer compassionate use program that medication will be shipped directly to their home from dispensing pharmacy of program's choosing.  We will follow-up with patient after insurance authorization is approved and copayment is known.  Patient knows to call the office with questions or concerns.  Oral Oncology Clinic will continue to follow.  Anthony Escobar, PharmD, BCPS, BCOP  02/20/2019   8:53 AM Oral Oncology Clinic [phone]

## 2019-02-20 NOTE — Progress Notes (Signed)
Hematology and Oncology Follow Up Visit  Anthony Escobar [id number] 10/22/1951 68 y.o. 02/20/2019 8:46 AM Anthony Escobar, MDBoyd, Anthony Factor, MD   Principle Diagnosis: 68 year old man with advanced prostate cancer diagnosed in January 2019.  He presented with castration-sensitive disease to the lung with Gleason score 4+4 = 8 and PSA of 19.57.  He developed castration-resistant disease and February 2020.   Prior Therapy:  He is status post prostate biopsy on December 29, 2017 showed a Gleason score 4+4 = 8 and 5 cores.   Taxotere chemotherapy with the first cycle of chemotherapy given on February 02, 2018.  He completed 6 cycles of therapy on May 18, 2018.   Current therapy:  Androgen deprivation therapy with Lupron.  Under consideration to start additional therapy.  Interim History: [name] is here for a follow-up visit.  Since the last visit, he continues to do well and reports improvement in his overall health.  He has resumed work related duties and currently works full-time in Theatre manager.  He denies any hot flashes, weakness or bone pain.  He denies any respiratory complaints.  His performance status quality of life remains excellent.  Patient denied any alteration mental status, neuropathy, confusion or dizziness.  Denies any headaches or lethargy.  Denies any night sweats, weight loss or changes in appetite.  Denied orthopnea, dyspnea on exertion or chest discomfort.  Denies shortness of breath, difficulty breathing hemoptysis or cough.  Denies any abdominal distention, nausea, early satiety or dyspepsia.  Denies any hematuria, frequency, dysuria or nocturia.  Denies any skin irritation, dryness or rash.  Denies any ecchymosis or petechiae.  Denies any lymphadenopathy or clotting.  Denies any heat or cold intolerance.  Denies any anxiety or depression.  Remaining review of system is negative.      Medications: I have reviewed the patient's current medications.   Current Outpatient Medications  Medication Sig Dispense Refill  . albuterol (PROAIR HFA) 108 (90 Base) MCG/ACT inhaler Inhale 2 puffs into the lungs every 4 (four) hours as needed for wheezing or shortness of breath. 1 Inhaler 5  . amitriptyline (ELAVIL) 75 MG tablet Take 75 mg by mouth at bedtime.    Marland Kitchen atorvastatin (LIPITOR) 10 MG tablet Take 10 mg by mouth daily.    Marland Kitchen lisinopril (PRINIVIL,ZESTRIL) 10 MG tablet Take 10 mg by mouth daily.     . prochlorperazine (COMPAZINE) 10 MG tablet Take 1 tablet (10 mg total) by mouth every 6 (six) hours as needed for nausea or vomiting. 30 tablet 0  . Tiotropium Bromide Monohydrate (SPIRIVA RESPIMAT) 2.5 MCG/ACT AERS Inhale 2 puffs into the lungs daily. 2 Inhaler 0  . enzalutamide (XTANDI) 40 MG capsule Take 4 capsules (160 mg total) by mouth daily. 120 capsule 0   No current facility-administered medications for this visit.      Allergies: No Known Allergies  Past Medical History, Surgical history, Social history, and Family History reviewed is unchanged.  :  Physical Exam:  Blood pressure 127/87, pulse 93, temperature 97.9 F (36.6 C), temperature source Oral, resp. rate 18, height 5\' 8"  (1.727 m), weight 153 lb 1.6 oz (69.4 kg), SpO2 100 %.   ECOG: 0    General appearance: Comfortable appearing without any discomfort Head: Normocephalic without any trauma Oropharynx: Mucous membranes are moist and pink without any thrush or ulcers. Eyes: Pupils are equal and round reactive to light. Lymph nodes: No cervical, supraclavicular, inguinal or axillary lymphadenopathy.   Heart:regular rate and rhythm.  S1 and S2  without leg edema. Lung: Clear without any rhonchi or wheezes.  No dullness to percussion. Abdomin: Soft, nontender, nondistended with good bowel sounds.  No hepatosplenomegaly. Musculoskeletal: No joint deformity or effusion.  Full range of motion noted. Neurological: No deficits noted on motor, sensory and deep tendon reflex  exam. Skin: No petechial rash or dryness.  Appeared moist.       Lab Results: Lab Results  Component Value Date   WBC 5.9 02/20/2019   HGB 12.4 (L) 02/20/2019   HCT 38.3 (L) 02/20/2019   MCV 100.0 02/20/2019   PLT 417 (H) 02/20/2019     Chemistry      Component Value Date/Time   NA 138 09/29/2018 0926   K 4.8 09/29/2018 0926   CL 104 09/29/2018 0926   CO2 25 09/29/2018 0926   BUN 21 09/29/2018 0926   CREATININE 0.99 09/29/2018 0926   CREATININE 0.88 05/23/2013 1422      Component Value Date/Time   CALCIUM 10.0 09/29/2018 0926   ALKPHOS 95 09/29/2018 0926   AST 19 09/29/2018 0926   ALT 14 09/29/2018 0926   BILITOT 0.4 09/29/2018 0926     Results for Anthony Escobar, Anthony Escobar (MRN [id number]) as of 02/20/2019 07:59  Ref. Range 05/18/2018 08:05 07/04/2018 08:42 09/29/2018 09:26  Prostate Specific Ag, Serum Latest Ref Range: 0.0 - 4.0 ng/mL 0.9 0.8 3.2     Impression and Plan:  68 year old man with the following issues:  1.    Advanced prostate cancer with disease to the bone and lung diagnosed in 2019.  Currently developing castration-resistant disease with a rise in the PSA.  He is status post Taxotere chemotherapy with a PSA nadir to 0.8 in July 2019.  His PSA in October 2019 was up to 3.2.  The natural course of his disease was discussed today.  Treatment options for castration-resistant metastatic prostate cancer were reviewed.  These options would include Anthony Escobar, Provenge immunotherapy as well as additional chemotherapy.  Risks and benefits of all these approaches were reviewed today and elected to proceed with Xtandi.  Complication associated with this therapy include nausea, fatigue, hypertension and rarely seizures.  After discussion the plan is to start with 160 mg daily and reevaluate him in the near future for any complications.    2.  Bone pain: No pain reported at this time.  3.  Bone directed therapy: He has deferred the Xgeva option at this time.   I recommended continuing calcium and vitamin D supplements.   4.  Androgen depravation: Risks and benefits of continuing Lupron long-term was reviewed today.  Complications including hot flashes, weight gain among others were discussed.  Will receive it today will be repeated in 4 months.  5.  Prognosis: Therapy remains palliative although his performance status is excellent and aggressive therapy is warranted.  6.  Follow-up: We will be in 6 weeks to follow his progress.   25  minutes was spent with the patient face-to-face today.  More than 50% of time was spent on reviewing laboratory data, disease status update, treatment options and complications related therapy.    Zola Button, MD 2/25/20208:46 AM

## 2019-02-21 LAB — PROSTATE-SPECIFIC AG, SERUM (LABCORP): PROSTATE SPECIFIC AG, SERUM: 38.7 ng/mL — AB (ref 0.0–4.0)

## 2019-02-21 NOTE — Telephone Encounter (Signed)
Oral Oncology Patient Advocate Encounter  I followed up with [employer] today to make sure they have everything they needed from [place], they do. The application has went through benefits investigation and is now in the processing stage with the patient assistance program.  This encounter will be updated until final determination.  [place] Patient [place] Phone [phone] Fax [phone] 02/21/2019   9:57 AM

## 2019-02-22 NOTE — Telephone Encounter (Signed)
Oral Oncology Patient Advocate Encounter  I followed up with [employer] today and they are in the final stages of processing and should have a decision by the end of the business day tomorrow.  I spoke to the patient and let him know that I should be able to give him an update on Monday, he verbalized understanding and great appreciation.  This encounter will be updated until final determination.  [place] Patient [place] Phone [phone] Fax [phone] 02/22/2019   2:49 PM

## 2019-02-23 NOTE — Telephone Encounter (Signed)
Oral Oncology Pharmacist Encounter  Received notification from [facility] support solutions that patient has been successfully enrolled into patient assistance program to receive Xtandi at $0 out-of-pocket cost from 02/23/2019-12/27/2019.  Approval letter states that the [place] patient assistance program dispensing pharmacy will contact the patient to schedule delivery of his first fill, the patient should expect the first fill in 3-5 business days of this notice, and the pharmacy will contact patient for future shipments.  I called patient and informed him of above good news. Patient informed that dispensing pharmacy will be reaching out to him to schedule shipment of his first fill of Xtandi.   They will not ship the medicine unless they are able to speak with the patient.   They will also be reaching out each month to schedule shipment of all subsequent refills, and will need to speak to the patient each time.  Patient instructed to contact oral oncology clinic if he has not heard from patient assistance pharmacy by the mid week of 02/26/2019.  Patient expressed understanding and appreciation. He knows to call the office with any additional questions or concerns.  Johny Drilling, PharmD, BCPS, BCOP  02/23/2019 12:19 PM Oral Oncology Clinic [phone]

## 2019-02-28 ENCOUNTER — Ambulatory Visit (HOSPITAL_COMMUNITY)
Admission: RE | Admit: 2019-02-28 | Discharge: 2019-02-28 | Disposition: A | Discharge status: Home / Self Care | Payer: Medicare Other | Source: Ambulatory Visit | Attending: Oncology | Admitting: Oncology

## 2019-02-28 ENCOUNTER — Other Ambulatory Visit: Payer: Self-pay

## 2019-02-28 DIAGNOSIS — C61 Malignant neoplasm of prostate: Secondary | ICD-10-CM | POA: Diagnosis not present

## 2019-02-28 MED ORDER — SODIUM CHLORIDE (PF) 0.9 % IJ SOLN
INTRAMUSCULAR | Status: AC
  Filled 2019-02-28: qty 50

## 2019-02-28 MED ORDER — IOHEXOL 300 MG/ML  SOLN
100.0000 mL | Freq: Once | INTRAMUSCULAR | Status: AC | PRN
  Administered 2019-02-28: 100 mL via INTRAVENOUS

## 2019-02-28 MED ORDER — ENZALUTAMIDE 40 MG PO CAPS
160.0000 mg | ORAL_CAPSULE | Freq: Every day | ORAL | 0 refills | Status: DC
Start: 2019-02-28 — End: 2019-04-11

## 2019-03-02 ENCOUNTER — Telehealth: Payer: Self-pay

## 2019-03-02 NOTE — Telephone Encounter (Signed)
Received call from [place] requesting refill for Xtandi. Explained that patient is now going through [place] for refills due to patient assistance program.

## 2019-03-05 ENCOUNTER — Encounter: Payer: Self-pay | Admitting: Oncology

## 2019-03-06 ENCOUNTER — Telehealth: Payer: Self-pay | Admitting: *Deleted

## 2019-03-06 NOTE — Telephone Encounter (Signed)
Spoke with patient. Per dr Alen Blew, his scans are good. No new cancer spots. Old cancer spots have healed.

## 2019-03-07 ENCOUNTER — Telehealth: Payer: Self-pay | Admitting: *Deleted

## 2019-03-07 NOTE — Telephone Encounter (Signed)
Spoke with patient, gave results of last PSA 

## 2019-03-07 NOTE — Telephone Encounter (Signed)
Attempted to reach patient with lab results, VM has not been set up

## 2019-03-07 NOTE — Telephone Encounter (Signed)
-----   Message from [name], MD sent at 02/21/2019  8:24 AM EST ----- Please let him know his PSA is upas we anticipated. We are starting Xtandi like we discussed.

## 2019-04-11 ENCOUNTER — Other Ambulatory Visit: Payer: Self-pay

## 2019-04-11 ENCOUNTER — Telehealth: Payer: Self-pay | Admitting: Oncology

## 2019-04-11 ENCOUNTER — Inpatient Hospital Stay: Payer: Medicare Other | Attending: Oncology | Admitting: Oncology

## 2019-04-11 ENCOUNTER — Inpatient Hospital Stay: Payer: Medicare Other

## 2019-04-11 VITALS — BP 140/87 | HR 96 | Temp 98.1°F | Resp 18 | Ht 68.0 in | Wt 155.4 lb

## 2019-04-11 DIAGNOSIS — C7951 Secondary malignant neoplasm of bone: Secondary | ICD-10-CM | POA: Diagnosis not present

## 2019-04-11 DIAGNOSIS — C61 Malignant neoplasm of prostate: Secondary | ICD-10-CM | POA: Insufficient documentation

## 2019-04-11 DIAGNOSIS — M25559 Pain in unspecified hip: Secondary | ICD-10-CM | POA: Diagnosis not present

## 2019-04-11 DIAGNOSIS — C7800 Secondary malignant neoplasm of unspecified lung: Secondary | ICD-10-CM | POA: Diagnosis not present

## 2019-04-11 DIAGNOSIS — Z79899 Other long term (current) drug therapy: Secondary | ICD-10-CM | POA: Insufficient documentation

## 2019-04-11 LAB — CMP (CANCER CENTER ONLY)
ALT: 10 U/L (ref 0–44)
AST: 20 U/L (ref 15–41)
Albumin: 4.2 g/dL (ref 3.5–5.0)
Alkaline Phosphatase: 234 U/L — ABNORMAL HIGH (ref 38–126)
Anion gap: 12 (ref 5–15)
BUN: 18 mg/dL (ref 8–23)
CO2: 25 mmol/L (ref 22–32)
Calcium: 9.8 mg/dL (ref 8.9–10.3)
Chloride: 101 mmol/L (ref 98–111)
Creatinine: 0.91 mg/dL (ref 0.61–1.24)
GFR, Est AFR Am: >60 mL/min (ref >60)
GFR, Estimated: >60 mL/min (ref >60)
Glucose, Bld: 97 mg/dL (ref 70–99)
Potassium: 4.3 mmol/L (ref 3.5–5.1)
Sodium: 138 mmol/L (ref 135–145)
Total Bilirubin: 0.4 mg/dL (ref 0.3–1.2)
Total Protein: 7.8 g/dL (ref 6.5–8.1)

## 2019-04-11 LAB — CBC WITH DIFFERENTIAL (CANCER CENTER ONLY)
Abs Immature Granulocytes: 0.02 10*3/uL (ref 0.00–0.07)
Basophils Absolute: 0 10*3/uL (ref 0.0–0.1)
Basophils Relative: 0 %
Eosinophils Absolute: 0.1 10*3/uL (ref 0.0–0.5)
Eosinophils Relative: 2 %
HCT: 41 % (ref 39.0–52.0)
Hemoglobin: 13.3 g/dL (ref 13.0–17.0)
Immature Granulocytes: 0 %
Lymphocytes Relative: 21 %
Lymphs Abs: 1.9 10*3/uL (ref 0.7–4.0)
MCH: 32.7 pg (ref 26.0–34.0)
MCHC: 32.4 g/dL (ref 30.0–36.0)
MCV: 100.7 fL — ABNORMAL HIGH (ref 80.0–100.0)
Monocytes Absolute: 0.7 10*3/uL (ref 0.1–1.0)
Monocytes Relative: 8 %
Neutro Abs: 6.3 10*3/uL (ref 1.7–7.7)
Neutrophils Relative %: 69 %
Platelet Count: 456 10*3/uL — ABNORMAL HIGH (ref 150–400)
RBC: 4.07 MIL/uL — ABNORMAL LOW (ref 4.22–5.81)
RDW: 12.6 % (ref 11.5–15.5)
WBC Count: 9 10*3/uL (ref 4.0–10.5)
nRBC: 0 % (ref 0.0–0.2)

## 2019-04-11 MED ORDER — ENZALUTAMIDE 40 MG PO CAPS: 160.0000 mg | ORAL_CAPSULE | Freq: Every day | ORAL | 0 refills | Status: DC

## 2019-04-11 NOTE — Telephone Encounter (Signed)
Scheduled appt per 4/15 sch message.

## 2019-04-11 NOTE — Progress Notes (Signed)
Hematology and Oncology Follow Up Visit  Anthony Escobar [id number] April 12, 1951 68 y.o. 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  He was found to have Gleason score 4+4 = 8 and PSA of 19.57.     Prior Therapy:  He is status post prostate biopsy on December 29, 2017 showed a Gleason score 4+4 = 8 and 5 cores.   Taxotere chemotherapy with the first cycle of chemotherapy given on February 02, 2018.  He completed 6 cycles of therapy on May 18, 2018.   Current therapy:  Androgen deprivation therapy with Lupron.  Xtandi 160 mg daily started in March of 2020.  Interim History: [name] returns today for a repeat evaluation.  Since the last visit, he reports no major complaints at this time.  He has been taking Xtandi without any major complications.  He denies any nausea, fatigue or lower extremity edema.  He remains ambulatory without any decline in ability to do so.  He does report periodic hip pain related to arthritis that is manageable with ibuprofen.  He denies respiratory complaints or recent hospitalizations.  He denied headaches, blurry vision, syncope or seizures.  Denies any fevers, chills or sweats.  Denied chest pain, palpitation, orthopnea or leg edema.  Denied cough, wheezing or hemoptysis.  Denied nausea, vomiting or abdominal pain.  Denies any constipation or diarrhea.  Denies any frequency urgency or hesitancy.  Denies any arthralgias or myalgias.  Denies any skin rashes or lesions.  Denies any bleeding or clotting tendency.  Denies any easy bruising.  Denies any hair or nail changes.  Denies any anxiety or depression.  Remaining review of system is negative.        Medications: I have reviewed the patient's current medications.  Current Outpatient Medications  Medication Sig Dispense Refill  . albuterol (PROAIR  HFA) 108 (90 Base) MCG/ACT inhaler Inhale 2 puffs into the lungs every 4 (four) hours as needed for wheezing or shortness of breath. 1 Inhaler 5  . amitriptyline (ELAVIL) 75 MG tablet Take 75 mg by mouth at bedtime.    Marland Kitchen atorvastatin (LIPITOR) 10 MG tablet Take 10 mg by mouth daily.    . enzalutamide (XTANDI) 40 MG capsule Take 4 capsules (160 mg total) by mouth daily. 120 capsule 0  . lisinopril (PRINIVIL,ZESTRIL) 10 MG tablet Take 10 mg by mouth daily.     . prochlorperazine (COMPAZINE) 10 MG tablet Take 1 tablet (10 mg total) by mouth every 6 (six) hours as needed for nausea or vomiting. 30 tablet 0  . Tiotropium Bromide Monohydrate (SPIRIVA RESPIMAT) 2.5 MCG/ACT AERS Inhale 2 puffs into the lungs daily. 2 Inhaler 0   No current facility-administered medications for this visit.      Allergies: No Known Allergies  Past Medical History, Surgical history, Social history, and Family History reviewed is unchanged.  :  Physical Exam:   Blood pressure 140/87, pulse 96, temperature 98.1 F (36.7 C), temperature source Oral, resp. rate 18, height 5\' 8"  (1.727 m), weight 155 lb 6.4 oz (70.5 kg), SpO2 100 %.    ECOG: 0     General appearance: Alert, awake without any distress. Head: Atraumatic without abnormalities Oropharynx: Without any thrush or ulcers. Eyes: No scleral icterus. Lymph nodes: No lymphadenopathy noted in the cervical, supraclavicular, or axillary nodes Heart:regular rate and rhythm, without any murmurs or gallops.  Lung: Clear to auscultation without any rhonchi, wheezes or dullness to percussion. Abdomin: Soft, nontender without any shifting dullness or ascites. Musculoskeletal: No clubbing or cyanosis. Neurological: No motor or sensory deficits. Skin: No rashes or lesions.       Lab Results: Lab Results  Component Value Date   WBC 5.9 02/20/2019   HGB 12.4 (L) 02/20/2019   HCT 38.3 (L) 02/20/2019   MCV 100.0 02/20/2019   PLT 417 (H) 02/20/2019      Chemistry      Component Value Date/Time   NA 139 02/20/2019 0815   K 4.4 02/20/2019 0815   CL 104 02/20/2019 0815   CO2 25 02/20/2019 0815   BUN 16 02/20/2019 0815   CREATININE 0.82 02/20/2019 0815   CREATININE 0.88 05/23/2013 1422      Component Value Date/Time   CALCIUM 9.5 02/20/2019 0815   ALKPHOS 174 (H) 02/20/2019 0815   AST 21 02/20/2019 0815   ALT 12 02/20/2019 0815   BILITOT 0.4 02/20/2019 0815      Results for VIRLAN, KEMPKER (MRN [id number]) as of 04/11/2019 12:50  Ref. Range 09/29/2018 09:26 02/20/2019 08:15  Prostate Specific Ag, Serum Latest Ref Range: 0.0 - 4.0 ng/mL 3.2 38.7 (H)     Impression and Plan:  68 year old man with the following issues:  1.    Castration-resistant prostate cancer with disease to the bone and pulmonary involvement diagnosed in 2019.    He was started on Xtandi 160 mg daily in February 2020 after developing a rise in PSA up to 38.7.  CT scan for staging purposes obtained on February 28, 2019 was personally reviewed and showed no visceral metastasis and his pulmonary nodules has decreased as well.  The rise in the PSA likely will predict worsening disease in the future and necessitated the start of Xtandi.  Risks and benefits of continuing this medication long-term was discussed.  Complications including hypertension, hematuria and rarely seizures were reviewed.  He is agreeable to continue at this time.    2.  Bone pain: Predominantly related to hip pain.  He is taking ibuprofen and I recommended alternating that with Tylenol.  3.  Bone directed therapy: I recommended continuing calcium and vitamin D supplements for the time being.  He declined Xgeva at this time.   4.  Androgen depravation: He continues to be on Lupron 30 mg every 4 months.  His next injection will be in June 2020.  Long-term complication associated with this treatment includes osteoporosis, weight gain among others.  5.  Prognosis: Treatment remains palliative at  this time although aggressive therapy is warranted given his overall excellent performance status.  6.  Follow-up: We will be in June 2020 for repeat evaluation.   25  minutes was spent with the patient face-to-face today.  More than 50% of time was dedicated on reviewing his CT scan, disease status update, dealing with complications related to cancer therapy and answering questions regarding future plan of care.    Zola Button, MD 4/15/20201:02 PM

## 2019-04-12 ENCOUNTER — Telehealth: Payer: Self-pay

## 2019-04-12 LAB — PROSTATE-SPECIFIC AG, SERUM (LABCORP): Prostate Specific Ag, Serum: 2.7 ng/mL (ref 0.0–4.0)

## 2019-04-12 NOTE — Telephone Encounter (Signed)
Contacted patient and made aware of PSA results.

## 2019-04-12 NOTE — Telephone Encounter (Signed)
-----   Message from [name], MD sent at 04/12/2019  8:38 AM EDT ----- Please let him know his PSA is down.

## 2019-06-14 ENCOUNTER — Other Ambulatory Visit: Payer: Self-pay

## 2019-06-14 ENCOUNTER — Inpatient Hospital Stay: Payer: Medicare Other | Attending: Oncology

## 2019-06-14 ENCOUNTER — Inpatient Hospital Stay: Payer: Medicare Other | Admitting: Oncology

## 2019-06-14 ENCOUNTER — Inpatient Hospital Stay: Payer: Medicare Other

## 2019-06-14 VITALS — BP 139/69 | HR 91 | Temp 98.2°F | Resp 18 | Ht 68.0 in | Wt 157.5 lb

## 2019-06-14 DIAGNOSIS — Z79899 Other long term (current) drug therapy: Secondary | ICD-10-CM | POA: Diagnosis not present

## 2019-06-14 DIAGNOSIS — I10 Essential (primary) hypertension: Secondary | ICD-10-CM | POA: Insufficient documentation

## 2019-06-14 DIAGNOSIS — C61 Malignant neoplasm of prostate: Secondary | ICD-10-CM

## 2019-06-14 LAB — CMP (CANCER CENTER ONLY)
ALT: 12 U/L (ref 0–44)
AST: 19 U/L (ref 15–41)
Albumin: 4 g/dL (ref 3.5–5.0)
Alkaline Phosphatase: 138 U/L — ABNORMAL HIGH (ref 38–126)
Anion gap: 10 (ref 5–15)
BUN: 20 mg/dL (ref 8–23)
CO2: 25 mmol/L (ref 22–32)
Calcium: 9.3 mg/dL (ref 8.9–10.3)
Chloride: 102 mmol/L (ref 98–111)
Creatinine: 0.93 mg/dL (ref 0.61–1.24)
GFR, Est AFR Am: >60 mL/min (ref >60)
GFR, Estimated: >60 mL/min (ref >60)
Glucose, Bld: 98 mg/dL (ref 70–99)
Potassium: 4.3 mmol/L (ref 3.5–5.1)
Sodium: 137 mmol/L (ref 135–145)
Total Bilirubin: 0.3 mg/dL (ref 0.3–1.2)
Total Protein: 7.2 g/dL (ref 6.5–8.1)

## 2019-06-14 LAB — CBC WITH DIFFERENTIAL (CANCER CENTER ONLY)
Abs Immature Granulocytes: 0.02 10*3/uL (ref 0.00–0.07)
Basophils Absolute: 0 10*3/uL (ref 0.0–0.1)
Basophils Relative: 0 %
Eosinophils Absolute: 0.3 10*3/uL (ref 0.0–0.5)
Eosinophils Relative: 5 %
HCT: 39.6 % (ref 39.0–52.0)
Hemoglobin: 12.7 g/dL — ABNORMAL LOW (ref 13.0–17.0)
Immature Granulocytes: 0 %
Lymphocytes Relative: 24 %
Lymphs Abs: 1.4 10*3/uL (ref 0.7–4.0)
MCH: 32.5 pg (ref 26.0–34.0)
MCHC: 32.1 g/dL (ref 30.0–36.0)
MCV: 101.3 fL — ABNORMAL HIGH (ref 80.0–100.0)
Monocytes Absolute: 0.6 10*3/uL (ref 0.1–1.0)
Monocytes Relative: 11 %
Neutro Abs: 3.5 10*3/uL (ref 1.7–7.7)
Neutrophils Relative %: 60 %
Platelet Count: 447 10*3/uL — ABNORMAL HIGH (ref 150–400)
RBC: 3.91 MIL/uL — ABNORMAL LOW (ref 4.22–5.81)
RDW: 13 % (ref 11.5–15.5)
WBC Count: 5.8 10*3/uL (ref 4.0–10.5)
nRBC: 0 % (ref 0.0–0.2)

## 2019-06-14 MED ORDER — LEUPROLIDE ACETATE (4 MONTH) 30 MG IM KIT
30.0000 mg | PACK | Freq: Once | INTRAMUSCULAR | Status: AC
  Administered 2019-06-14: 30 mg via INTRAMUSCULAR
  Filled 2019-06-14: qty 30

## 2019-06-14 NOTE — Progress Notes (Signed)
Hematology and Oncology Follow Up Visit  [name] [id number] [birth year] 68 y.o. 06/14/2019 9:41 AM Anthony Escobar, MDBoyd, [name], MD   Principle Diagnosis: 68 year old man with advanced prostate cancer with disease to the bone documented January 2019.  He has castration-resistant after presenting with Gleason score 4+4 = 8 and PSA of 19.57.     Prior Therapy:  He is status post prostate biopsy on December 29, 2017 showed a Gleason score 4+4 = 8 and 5 cores.   Taxotere chemotherapy with the first cycle of chemotherapy given on February 02, 2018.  He completed 6 cycles of therapy on May 18, 2018.   Current therapy:  Androgen deprivation therapy with Lupron.  Xtandi 160 mg daily started in March of 2020.  Interim History: [name] presents today for a follow-up.  Since last visit, he continues to tolerate Xtandi without any major complications.  He denies any excessive fatigue, tiredness or hematuria.  Continues to be active and attends to activities of daily living.  He is exercising regularly and eating healthy and remains in good health in good shape.  Denies any pathological fractures or hospitalization.  He denied any alteration mental status, neuropathy, confusion or dizziness.  Denies any headaches or lethargy.  Denies any night sweats, weight loss or changes in appetite.  Denied orthopnea, dyspnea on exertion or chest discomfort.  Denies shortness of breath, difficulty breathing hemoptysis or cough.  Denies any abdominal distention, nausea, early satiety or dyspepsia.  Denies any hematuria, frequency, dysuria or nocturia.  Denies any skin irritation, dryness or rash.  Denies any ecchymosis or petechiae.  Denies any lymphadenopathy or clotting.  Denies any heat or cold intolerance.  Denies any anxiety or depression.  Remaining review of system is negative.          Medications: I have reviewed the patient's current medications.  Current Outpatient  Medications  Medication Sig Dispense Refill  . albuterol (PROAIR HFA) 108 (90 Base) MCG/ACT inhaler Inhale 2 puffs into the lungs every 4 (four) hours as needed for wheezing or shortness of breath. 1 Inhaler 5  . amitriptyline (ELAVIL) 75 MG tablet Take 75 mg by mouth at bedtime.    Anthony Escobar atorvastatin (LIPITOR) 10 MG tablet Take 10 mg by mouth daily.    . enzalutamide (XTANDI) 40 MG capsule Take 4 capsules (160 mg total) by mouth daily. 120 capsule 0  . lisinopril (PRINIVIL,ZESTRIL) 10 MG tablet Take 10 mg by mouth daily.     . prochlorperazine (COMPAZINE) 10 MG tablet Take 1 tablet (10 mg total) by mouth every 6 (six) hours as needed for nausea or vomiting. 30 tablet 0  . Tiotropium Bromide Monohydrate (SPIRIVA RESPIMAT) 2.5 MCG/ACT AERS Inhale 2 puffs into the lungs daily. 2 Inhaler 0   No current facility-administered medications for this visit.      Allergies: No Known Allergies  Past Medical History, Surgical history, Social history, and Family History reviewed is unchanged.  :  Physical Exam:   Blood pressure 139/69, pulse 91, temperature 98.2 F (36.8 C), temperature source Oral, resp. rate 18, height 5\' 8"  (1.727 m), weight 157 lb 8 oz (71.4 kg), SpO2 99 %.    ECOG: 0    General appearance: Comfortable appearing without any discomfort Head: Normocephalic without any trauma Oropharynx: Mucous membranes are moist and pink without any thrush or ulcers. Eyes: Pupils are equal and round reactive to light. Lymph nodes: No cervical, supraclavicular, inguinal or axillary lymphadenopathy.   Heart:regular rate and  rhythm.  S1 and S2 without leg edema. Lung: Clear without any rhonchi or wheezes.  No dullness to percussion. Abdomin: Soft, nontender, nondistended with good bowel sounds.  No hepatosplenomegaly. Musculoskeletal: No joint deformity or effusion.  Full range of motion noted. Neurological: No deficits noted on motor, sensory and deep tendon reflex exam. Skin: No petechial  rash or dryness.  Appeared moist.         Lab Results: Lab Results  Component Value Date   WBC 5.8 06/14/2019   HGB 12.7 (L) 06/14/2019   HCT 39.6 06/14/2019   MCV 101.3 (H) 06/14/2019   PLT 447 (H) 06/14/2019     Chemistry      Component Value Date/Time   NA 138 04/11/2019 1255   K 4.3 04/11/2019 1255   CL 101 04/11/2019 1255   CO2 25 04/11/2019 1255   BUN 18 04/11/2019 1255   CREATININE 0.91 04/11/2019 1255   CREATININE 0.88 05/23/2013 1422      Component Value Date/Time   CALCIUM 9.8 04/11/2019 1255   ALKPHOS 234 (H) 04/11/2019 1255   AST 20 04/11/2019 1255   ALT 10 04/11/2019 1255   BILITOT 0.4 04/11/2019 1255      Results for Escobar, Anthony (MRN [id number]) as of 06/14/2019 09:33  Ref. Range 02/20/2019 08:15 04/11/2019 12:55  Prostate Specific Ag, Serum Latest Ref Range: 0.0 - 4.0 ng/mL 38.7 (H) 2.7      Impression and Plan:  68 year old man with the following issues:  1.    Advanced prostate cancer with disease to the bone that is currently castration-resistant diagnosed initially in 2019.    He remains on Xtandi without any major complications noted.  Risks and benefits of continuing this medication long-term complications were reviewed.  These complications include hypertension, excessive tiredness and hematuria.  Rarely seizures have been reported.  After discussion today is agreeable to continue in his PSA continues to show excellent response.  Different salvage therapy may be needed he develops relapsed disease.    2.  Bone pain: Very minimal at this time.  Rarely takes any pain Acacian for it.  3.  Bone directed therapy: He is currently on calcium and vitamin D supplements without any issues.  Delton See will be deferred till he obtains dental clearance.   4.  Androgen depravation: He is currently on Lupron which she has tolerated very well.  Long-term complications including weight gain, hot flashes were reviewed.  He will proceed today and  repeated in 4 months.  5.  Prognosis: His disease is incurable although aggressive measures are warranted given his excellent performance status.  6.  Follow-up: In 4 months for repeat evaluation.   25  minutes was spent with the patient face-to-face today.  More than 50% of time was spent on reviewing his disease status, complication lytic therapy and answering questions regarding future plan of care.    Zola Button, MD 6/18/20209:41 AM

## 2019-06-15 ENCOUNTER — Telehealth: Payer: Self-pay

## 2019-06-15 LAB — PROSTATE-SPECIFIC AG, SERUM (LABCORP): Prostate Specific Ag, Serum: 0.6 ng/mL (ref 0.0–4.0)

## 2019-06-15 NOTE — Telephone Encounter (Signed)
-----   Message from [name], MD sent at 06/15/2019  8:06 AM EDT ----- Please let him know his PSA is down.

## 2019-06-15 NOTE — Telephone Encounter (Signed)
Attempted to contact patient x4. Unable to leave voicemail as mailbox is not set up.

## 2019-06-19 ENCOUNTER — Telehealth: Payer: Self-pay | Admitting: Oncology

## 2019-06-19 NOTE — Telephone Encounter (Signed)
Scheduled per los. Mailed printout  °

## 2019-06-27 ENCOUNTER — Other Ambulatory Visit: Payer: Self-pay

## 2019-06-27 DIAGNOSIS — C61 Malignant neoplasm of prostate: Secondary | ICD-10-CM

## 2019-06-27 MED ORDER — XTANDI 40 MG PO CAPS: 160.0000 mg | ORAL_CAPSULE | Freq: Every day | ORAL | 0 refills | Status: DC

## 2019-10-02 ENCOUNTER — Other Ambulatory Visit: Payer: Self-pay | Admitting: Oncology

## 2019-10-02 DIAGNOSIS — C61 Malignant neoplasm of prostate: Secondary | ICD-10-CM

## 2019-10-05 ENCOUNTER — Telehealth: Payer: Self-pay

## 2019-10-05 NOTE — Telephone Encounter (Signed)
[facility]ral [facility] [employer] [facility] patient assistance will expire on 12/27/19. Starting Jan 2021 we will fill the [facility] using this grant.  Was successful in securing patient an $5 grant from [employer] [facility]) to provide copayment coverage for [employer].  This will keep the out of pocket expense at $0.     I have spoken with the patient.    The billing information is as follows and has been shared with [place].   Member ID: [id number] Group ID: [id number] RxBin: [id number] Dates of Eligibility: 07/04/19 through 09/30/20  Fund:  [place] [employer] Phone [phone] Fax [phone] 10/05/2019    3:59 PM

## 2019-10-15 ENCOUNTER — Telehealth: Payer: Self-pay

## 2019-10-15 ENCOUNTER — Other Ambulatory Visit: Payer: Self-pay

## 2019-10-15 ENCOUNTER — Other Ambulatory Visit: Payer: Self-pay | Admitting: Medical

## 2019-10-15 DIAGNOSIS — C61 Malignant neoplasm of prostate: Secondary | ICD-10-CM

## 2019-10-15 MED ORDER — HYDROCODONE-ACETAMINOPHEN 5-325 MG PO TABS: ORAL_TABLET | ORAL | 0 refills | Status: DC

## 2019-10-15 NOTE — Telephone Encounter (Signed)
Patient's granddaughter called office and left message stating patient is still in pain with no relief.  Granddaughter stated that patient also is reporting swelling in the groin area. Sandi Mealy, PA notified and pain medication prescription sent to patient's pharmacy. Patient will be seeing Dr. Alen Blew on 10/16/19 and will be evaluated at that time.

## 2019-10-15 NOTE — Telephone Encounter (Signed)
Called patient and informed him of advice from Dr. Alen Blew listed below. Patient verbalized understanding. Patient scheduled to be seen in this office tomorrow, 10/16/19.

## 2019-10-15 NOTE — Telephone Encounter (Signed)
-----   Message from [name], MD sent at 10/15/2019  9:36 AM EDT ----- Regarding: RE: Pain Managment He can use ibuprofen or Tylenol for now.  I will evaluate him tomorrow and assess whether he needs any additional imaging or medication.  Thank you ----- Message ----- From: Teodoro Spray, RN Sent: 10/15/2019   9:19 AM EDT To: [name], MD Subject: Pain Managment                                 Patient called stating he is had pain in his R hip for 2 weeks. Patient stated he was seen at [facility] on 10/12/19 and "got a tramadol shot for pain and some anti-inflammatory pills". Patient states pain is still there with no relief, and wants to know if the pain could be from his cancer. Patient is also requesting pain medication for the pain.  Patient has appointment to see you tomorrow on 10/16/19.  Please advise.

## 2019-10-16 ENCOUNTER — Ambulatory Visit (HOSPITAL_COMMUNITY)
Admission: RE | Admit: 2019-10-16 | Discharge: 2019-10-16 | Disposition: A | Discharge status: Home / Self Care | Payer: Medicare Other | Source: Ambulatory Visit | Attending: Oncology | Admitting: Oncology

## 2019-10-16 ENCOUNTER — Other Ambulatory Visit: Payer: Self-pay

## 2019-10-16 ENCOUNTER — Inpatient Hospital Stay: Payer: Medicare Other

## 2019-10-16 ENCOUNTER — Inpatient Hospital Stay: Payer: Medicare Other | Attending: Oncology | Admitting: Oncology

## 2019-10-16 VITALS — BP 122/80 | HR 106 | Temp 98.0°F | Resp 17 | Ht 68.0 in | Wt 157.9 lb

## 2019-10-16 DIAGNOSIS — M84451A Pathological fracture, right femur, initial encounter for fracture: Secondary | ICD-10-CM | POA: Diagnosis not present

## 2019-10-16 DIAGNOSIS — C61 Malignant neoplasm of prostate: Secondary | ICD-10-CM

## 2019-10-16 DIAGNOSIS — M25559 Pain in unspecified hip: Secondary | ICD-10-CM

## 2019-10-16 DIAGNOSIS — M79659 Pain in unspecified thigh: Secondary | ICD-10-CM | POA: Insufficient documentation

## 2019-10-16 DIAGNOSIS — M25551 Pain in right hip: Secondary | ICD-10-CM | POA: Insufficient documentation

## 2019-10-16 DIAGNOSIS — Z79899 Other long term (current) drug therapy: Secondary | ICD-10-CM | POA: Insufficient documentation

## 2019-10-16 LAB — CBC (CANCER CENTER ONLY)
HCT: 39 % (ref 39.0–52.0)
Hemoglobin: 13.1 g/dL (ref 13.0–17.0)
MCH: 33.6 pg (ref 26.0–34.0)
MCHC: 33.6 g/dL (ref 30.0–36.0)
MCV: 100 fL (ref 80.0–100.0)
Platelet Count: 533 10*3/uL — ABNORMAL HIGH (ref 150–400)
RBC: 3.9 MIL/uL — ABNORMAL LOW (ref 4.22–5.81)
RDW: 13.1 % (ref 11.5–15.5)
WBC Count: 9.1 10*3/uL (ref 4.0–10.5)
nRBC: 0 % (ref 0.0–0.2)

## 2019-10-16 LAB — CMP (CANCER CENTER ONLY)
ALT: 9 U/L (ref 0–44)
AST: 21 U/L (ref 15–41)
Albumin: 3.9 g/dL (ref 3.5–5.0)
Alkaline Phosphatase: 114 U/L (ref 38–126)
Anion gap: 12 (ref 5–15)
BUN: 23 mg/dL (ref 8–23)
CO2: 23 mmol/L (ref 22–32)
Calcium: 9.9 mg/dL (ref 8.9–10.3)
Chloride: 101 mmol/L (ref 98–111)
Creatinine: 1.2 mg/dL (ref 0.61–1.24)
GFR, Est AFR Am: >60 mL/min (ref >60)
GFR, Estimated: >60 mL/min (ref >60)
Glucose, Bld: 112 mg/dL — ABNORMAL HIGH (ref 70–99)
Potassium: 4.3 mmol/L (ref 3.5–5.1)
Sodium: 136 mmol/L (ref 135–145)
Total Bilirubin: 0.2 mg/dL — ABNORMAL LOW (ref 0.3–1.2)
Total Protein: 7.5 g/dL (ref 6.5–8.1)

## 2019-10-16 MED ORDER — GADOBUTROL 1 MMOL/ML IV SOLN
7.5000 mL | Freq: Once | INTRAVENOUS | Status: AC | PRN
  Administered 2019-10-16: 7.5 mL via INTRAVENOUS

## 2019-10-16 NOTE — Progress Notes (Signed)
Hematology and Oncology Follow Up Visit  Anthony Escobar [id number] [birth year] 68 y.o. 10/16/2019 9:15 AM [name], MDBoyd, [name], MD   Principle Diagnosis: 68 year old man with castration resistant prostate cancer with disease to the bone diagnosed in 2019.  He was found to have Gleason score 4+4 = 8 and PSA of 19.57 at the time of diagnosis.   Prior Therapy:  He is status post prostate biopsy on December 29, 2017 showed a Gleason score 4+4 = 8 and 5 cores.   Taxotere chemotherapy with the first cycle of chemotherapy given on February 02, 2018.  He completed 6 cycles of therapy on May 18, 2018.   Current therapy:  Androgen deprivation therapy with Lupron.  He received Lupron 30 mg in June 14, 2019.  Xtandi 160 mg daily started in March of 2020.  Interim History: Mr. Christmas is here for repeat evaluation.  Since the last visit, he reports feeling reasonably well up till the last 2 weeks.  He started developing worsening right hip and thigh pain that has worsened in the last few days.  He was evaluated by his primary care provider and received Toradol and Celebrex which did not help his pain at this time.  He is difficulties with flexing his right hip and ambulates very gingerly.  He is able to sleep without any difficulties.  Denies any falls or trauma.  He was prescribed hydrocodone on 10/15/2019 which have helped his pain at this time.  He has tolerated Xtandi without any major complications.  Denies any nausea, fatigue or weight loss.  His performance status and quality of life had been normal.   He denied headaches, blurry vision, syncope or seizures.  Denies any fevers, chills or sweats.  Denied chest pain, palpitation, orthopnea or leg edema.  Denied cough, wheezing or hemoptysis.  Denied nausea, vomiting or abdominal pain.  Denies any constipation or diarrhea.  Denies any frequency urgency or hesitancy. Denies any skin rashes or lesions.  Denies any bleeding or  clotting tendency.  Denies any easy bruising.  Denies any hair or nail changes.  Denies any anxiety or depression.  Remaining review of system is negative.                Medications: Updated today without any changes. Current Outpatient Medications  Medication Sig Dispense Refill  . albuterol (PROAIR HFA) 108 (90 Base) MCG/ACT inhaler Inhale 2 puffs into the lungs every 4 (four) hours as needed for wheezing or shortness of breath. 1 Inhaler 5  . amitriptyline (ELAVIL) 75 MG tablet Take 75 mg by mouth at bedtime.    Marland Kitchen atorvastatin (LIPITOR) 10 MG tablet Take 10 mg by mouth daily.    Marland Kitchen HYDROcodone-acetaminophen (NORCO) 5-325 MG tablet 1/2 to 1 tablet every 6 hours as needed for pain 15 tablet 0  . lisinopril (PRINIVIL,ZESTRIL) 10 MG tablet Take 10 mg by mouth daily.     . prochlorperazine (COMPAZINE) 10 MG tablet Take 1 tablet (10 mg total) by mouth every 6 (six) hours as needed for nausea or vomiting. 30 tablet 0  . Tiotropium Bromide Monohydrate (SPIRIVA RESPIMAT) 2.5 MCG/ACT AERS Inhale 2 puffs into the lungs daily. 2 Inhaler 0  . XTANDI 40 MG capsule Take 4 capsules (160 mg total) by mouth daily. 120 capsule 0   No current facility-administered medications for this visit.      Allergies: No Known Allergies  Past Medical History, Surgical history, Social history, and Family History updated remained without any  change. :  Physical Exam:   Blood pressure 122/80, pulse (!) 106, temperature 98 F (36.7 C), temperature source Temporal, resp. rate 17, height 5\' 8"  (1.727 m), weight 157 lb 14.4 oz (71.6 kg), SpO2 100 %.    ECOG: 0     General appearance: Alert, awake without any distress. Head: Atraumatic without abnormalities Oropharynx: Without any thrush or ulcers. Eyes: No scleral icterus. Lymph nodes: No lymphadenopathy noted in the cervical, supraclavicular, or axillary nodes Heart:regular rate and rhythm, without any murmurs or gallops.   Lung: Clear to  auscultation without any rhonchi, wheezes or dullness to percussion. Abdomin: Soft, nontender without any shifting dullness or ascites. Musculoskeletal: Limited range of motion in his right hip.  Limited exam due to pain. Neurological: No motor or sensory deficits. Skin: No rashes or lesions.           Lab Results: Lab Results  Component Value Date   WBC 9.1 10/16/2019   HGB 13.1 10/16/2019   HCT 39.0 10/16/2019   MCV 100.0 10/16/2019   PLT 533 (H) 10/16/2019     Chemistry      Component Value Date/Time   NA 136 10/16/2019 0824   K 4.3 10/16/2019 0824   CL 101 10/16/2019 0824   CO2 23 10/16/2019 0824   BUN 23 10/16/2019 0824   CREATININE 1.20 10/16/2019 0824   CREATININE 0.88 05/23/2013 1422      Component Value Date/Time   CALCIUM 9.9 10/16/2019 0824   ALKPHOS 114 10/16/2019 0824   AST 21 10/16/2019 0824   ALT 9 10/16/2019 0824   BILITOT 0.2 (L) 10/16/2019 0824      Results for Anthony Escobar, Anthony Escobar (MRN [id number]) as of 10/16/2019 09:20  Ref. Range 02/20/2019 08:15 04/11/2019 12:55 06/14/2019 09:06  Prostate Specific Ag, Serum Latest Ref Range: 0.0 - 4.0 ng/mL 38.7 (H) 2.7 0.6      Impression and Plan:  68 year old man with:   1.    Castration-resistant prostate cancer with disease to the bone diagnosed in 2019.    He has tolerated Xtandi with excellent PSA response at this time.  His PSA on June 18 was down to 0.6.  Risks and benefits of continuing this therapy alternative options were reviewed.  At this time recommended continuing the same dose and schedule.  Long-term complications: Hypertension, fatigue, hematuria and rarely seizures were reiterated.  Retreatment with chemotherapy may be an option if he develops relapsed disease.    2.    Right hip pain: His bone scan in July 2019 showed disease in right hip and femur which could explain his pain at this time.  We will repeat imaging studies including MRI and referral to orthopedics as well as radiation  oncology for evaluation.  He might require palliative radiation therapy and that might require possible orthopedic intervention pending the results of his MRI.  Prescription for hydrocodone was made available to him.  3.  Bone directed therapy: I recommended continuing calcium and vitamin D supplements.  Delton See will be implemented once he obtains dental clearance.   4.  Androgen depravation: He has tolerated androgen deprivation without any complications.  Next Eligard should be scheduled in the near future.  Long-term complications including a weight gain and hot flashes among others were reiterated.  He is agreeable to continue.  5.  Prognosis: He has incurable malignancy although aggressive measures are warranted given his excellent performance status.  6.  Follow-up: He will return in 2 months for repeat evaluation.  25  minutes was spent with the patient face-to-face today.  More than 50% of time was dedicated to updating his disease status, reviewing laboratory data, imaging studies answer questions regarding future plan of care.    Zola Button, MD 10/20/20209:15 AM

## 2019-10-17 ENCOUNTER — Encounter: Payer: Self-pay | Admitting: Orthopaedic Surgery

## 2019-10-17 ENCOUNTER — Ambulatory Visit (INDEPENDENT_AMBULATORY_CARE_PROVIDER_SITE_OTHER): Payer: Medicare Other | Admitting: Orthopaedic Surgery

## 2019-10-17 ENCOUNTER — Telehealth: Payer: Self-pay | Admitting: Oncology

## 2019-10-17 ENCOUNTER — Other Ambulatory Visit: Payer: Self-pay

## 2019-10-17 VITALS — Ht 68.0 in | Wt 157.0 lb

## 2019-10-17 DIAGNOSIS — M84451A Pathological fracture, right femur, initial encounter for fracture: Secondary | ICD-10-CM | POA: Diagnosis not present

## 2019-10-17 LAB — PROSTATE-SPECIFIC AG, SERUM (LABCORP): Prostate Specific Ag, Serum: 12.8 ng/mL — ABNORMAL HIGH (ref 0.0–4.0)

## 2019-10-17 MED ORDER — OXYCODONE-ACETAMINOPHEN 5-325 MG PO TABS: 1.0000 | ORAL_TABLET | Freq: Three times a day (TID) | ORAL | 0 refills | Status: DC | PRN

## 2019-10-17 NOTE — Progress Notes (Signed)
Office Visit Note   Patient: Anthony Escobar           Date of Birth: 08-Apr-1951           MRN: [id number] Visit Date: 10/17/2019              Requested by: Anthony Portela, MD [place],  [place] [address] PCP: Anthony Bill, MD   Assessment & Plan: Visit Diagnoses:  1. Pathologic intertrochanteric fracture, right, initial encounter [facility])     Plan: Impression is impending right pathologic fracture right intertrochanteric femur.  The MRI was reviewed with the patient in detail.  We have recommended surgical stabilization and radiation/chemo shortly thereafter.  We will plan on doing the surgery in the near future.  Risks and benefits and possible complications discussed.  Percocet prescribed today. Total face to face encounter time was greater than 45 minutes and over half of this time was spent in counseling and/or coordination of care.  Follow-Up Instructions: Return for 2 week postop visit.   Orders:  No orders of the defined types were placed in this encounter.  Meds ordered this encounter  Medications   oxyCODONE-acetaminophen (PERCOCET) 5-325 MG tablet    Sig: Take 1-2 tablets by mouth every 8 (eight) hours as needed for severe pain.    Dispense:  30 tablet    Refill:  0      Procedures: No procedures performed   Clinical Data: No additional findings.   Subjective: Chief Complaint  Patient presents with   Right Hip - Pain    Anthony Escobar is a very pleasant 68 year old gentleman who comes in for evaluation of an impending pathologic fracture of right intertrochanteric region.  He has known metastatic prostate cancer diagnosed in 2019.  He has severe pain with any weightbearing and with movement of his right leg and hip.  He is ambulating gingerly with crutches.  He has not been very active.  He is taking Aleve and Celebrex and hydrocodone with minimal relief.   Review of Systems  Constitutional: Negative.   All other systems reviewed  and are negative.    Objective: Vital Signs: Ht 5\' 8"  (1.727 m)    Wt 157 lb (71.2 kg)    BMI 23.87 kg/m   Physical Exam Vitals signs and nursing note reviewed.  Constitutional:      Appearance: He is well-developed.  HENT:     Head: Normocephalic and atraumatic.  Eyes:     Pupils: Pupils are equal, round, and reactive to light.  Neck:     Musculoskeletal: Neck supple.  Pulmonary:     Effort: Pulmonary effort is normal.  Abdominal:     Palpations: Abdomen is soft.  Musculoskeletal: Normal range of motion.  Skin:    General: Skin is warm.  Neurological:     Mental Status: He is alert and oriented to person, place, and time.  Psychiatric:        Behavior: Behavior normal.        Thought Content: Thought content normal.        Judgment: Judgment normal.     Ortho Exam Exam shows slow ambulation and limited weightbearing to the right lower extremity secondary to pain.  Painful movement of the right hip. Specialty Comments:  No specialty comments available.  Imaging: Mr Hip Right W Wo Contrast  Result Date: 10/16/2019 CLINICAL DATA:  Prostate cancer, continued hip pain EXAM: MRI OF THE RIGHT HIP WITHOUT AND WITH CONTRAST TECHNIQUE: Multiplanar,  multisequence MR imaging was performed both before and after administration of intravenous contrast. CONTRAST:  7.54mL GADAVIST GADOBUTROL 1 MMOL/ML IV SOLN COMPARISON:  CT February 28, 2019 FINDINGS: Bones: There is extensive T1 heterogeneous L/T2 bright multilocular cystic lytic lesion seen throughout the right femoral head, neck, greater and lesser trochanters as well as the proximal femur. Multiple fluid fluid levels are seen, likely blood products layering within the cystic lesions. There does appear to be areas of enhancement within the lesions. Cortical breakthrough seen along the posterior greater trochanter and along the lateral surface of the greater trochanter. There is enhancement involving the synovium. There is a small  femoroacetabular joint effusion. No definite fracture seen. There is increased marrow signal seen within the proximal femur, likely marrow edema. There does appear to be a small subchondral cystic lesion in the superior right acetabulum which does show enhancement, and could represent a focus of disease. No other osseous lesions are identified. There is moderate bilateral hip osteoarthritis with superior joint space loss and small subchondral cystic changes. The visualized bony pelvis appears normal. The visualized sacroiliac joints and symphysis pubis appear normal. Articular cartilage and labrum Articular cartilage: There is diffuse chondral thinning seen within the bilateral femoroacetabular joints. Labrum: There is no gross labral tear or paralabral abnormality. Joint or bursal effusion Joint effusion: A small joint effusion is seen at the right femoroacetabular joint with diffuse surrounding synovial enhancement. Bursae: No focal periarticular fluid collection. Muscles and tendons Muscles and tendons: There is increased signal with enhancement seen within the gluteal musculature and at the iliopsoas insertion site. The there is increased intrasubstance signal seen at the iliopsoas, gluteal, and hamstrings insertion site. Other findings Miscellaneous: There is a heterogeneous appearing prostate gland. Scattered colonic diverticula are noted. The remainder of the deep pelvis is grossly unremarkable. IMPRESSION: 1. Multi lobular extensive enhancing lytic lesions throughout the right femoral head, neck, greater and lesser trochanters extending into the proximal femoral shaft, consistent with the patient's known metastatic disease. There are multiple air fluid fluid levels with areas of cortical breakthrough seen along the greater trochanter. 2. Diffuse marrow edema within the adjacent femoral head and proximal femoral shaft. No definite fracture however seen. 3. Small enhancing cystic lesion in the superior  acetabulum which likely also represent a focus of metastatic disease 4. Moderate bilateral hip osteoarthritis. Electronically Signed   By: Prudencio Pair M.D.   On: 10/16/2019 19:23     PMFS History: Patient Active Problem List   Diagnosis Date Noted   Pathologic intertrochanteric fracture, right, initial encounter ([place]) 10/17/2019   Acute bronchitis 12/15/2018   Encounter for antineoplastic chemotherapy 04/27/2018   Prostate cancer ([place]) 01/19/2018   Multiple pulmonary nodules 10/28/2017   COPD (chronic obstructive pulmonary disease) ([place]) 10/18/2017   Tobacco use 10/18/2017   Past Medical History:  Diagnosis Date   Hyperlipidemia    Hypertension     Family History  Problem Relation Age of Onset   Stroke Mother    Cancer Father        esophageal    Past Surgical History:  Procedure Laterality Date   HERNIA REPAIR  1988   bilateral inguinal   Social History   Occupational History   Not on file  Tobacco Use   Smoking status: Former Smoker    Packs/day: 1.50    Years: 50.00    Pack years: 75.00    Types: Cigarettes    Quit date: 08/20/2011    Years since quitting: 8.1  Smokeless tobacco: Never Used  Substance and Sexual Activity   Alcohol use: Yes    Comment: 1beer a day   Drug use: No   Sexual activity: Not on file

## 2019-10-17 NOTE — Telephone Encounter (Signed)
Scheduled appt per 10/20 sch message - unable to reach pt  . vmail not set up / unable to leave message . Mailed reminder letter with appt date and time

## 2019-10-18 ENCOUNTER — Other Ambulatory Visit (HOSPITAL_COMMUNITY)
Admission: RE | Admit: 2019-10-18 | Discharge: 2019-10-18 | Disposition: A | Discharge status: Home / Self Care | Payer: Medicare Other | Source: Ambulatory Visit | Attending: Orthopaedic Surgery | Admitting: Orthopaedic Surgery

## 2019-10-18 ENCOUNTER — Telehealth: Payer: Self-pay | Admitting: Oncology

## 2019-10-18 DIAGNOSIS — Z20828 Contact with and (suspected) exposure to other viral communicable diseases: Secondary | ICD-10-CM | POA: Insufficient documentation

## 2019-10-18 DIAGNOSIS — Z01812 Encounter for preprocedural laboratory examination: Secondary | ICD-10-CM | POA: Insufficient documentation

## 2019-10-18 NOTE — Telephone Encounter (Signed)
I talk with patient about 12/22

## 2019-10-19 ENCOUNTER — Inpatient Hospital Stay (HOSPITAL_COMMUNITY)
Admission: EM | Admit: 2019-10-19 | Discharge: 2019-10-30 | DRG: 480 | Disposition: A | Discharge status: Skilled Nursing Facility | Payer: Medicare Other | Attending: Internal Medicine | Admitting: Internal Medicine

## 2019-10-19 ENCOUNTER — Inpatient Hospital Stay (HOSPITAL_COMMUNITY): Payer: Medicare Other

## 2019-10-19 ENCOUNTER — Emergency Department (HOSPITAL_COMMUNITY): Payer: Medicare Other

## 2019-10-19 ENCOUNTER — Encounter (HOSPITAL_COMMUNITY): Payer: Self-pay | Admitting: Emergency Medicine

## 2019-10-19 ENCOUNTER — Other Ambulatory Visit: Payer: Self-pay

## 2019-10-19 DIAGNOSIS — D62 Acute posthemorrhagic anemia: Secondary | ICD-10-CM | POA: Diagnosis not present

## 2019-10-19 DIAGNOSIS — R21 Rash and other nonspecific skin eruption: Secondary | ICD-10-CM | POA: Diagnosis not present

## 2019-10-19 DIAGNOSIS — J449 Chronic obstructive pulmonary disease, unspecified: Secondary | ICD-10-CM | POA: Diagnosis present

## 2019-10-19 DIAGNOSIS — J9601 Acute respiratory failure with hypoxia: Secondary | ICD-10-CM | POA: Diagnosis not present

## 2019-10-19 DIAGNOSIS — K567 Ileus, unspecified: Secondary | ICD-10-CM | POA: Diagnosis not present

## 2019-10-19 DIAGNOSIS — N179 Acute kidney failure, unspecified: Secondary | ICD-10-CM | POA: Diagnosis not present

## 2019-10-19 DIAGNOSIS — C7802 Secondary malignant neoplasm of left lung: Secondary | ICD-10-CM | POA: Diagnosis present

## 2019-10-19 DIAGNOSIS — M84451A Pathological fracture, right femur, initial encounter for fracture: Principal | ICD-10-CM | POA: Diagnosis present

## 2019-10-19 DIAGNOSIS — T402X5A Adverse effect of other opioids, initial encounter: Secondary | ICD-10-CM | POA: Diagnosis present

## 2019-10-19 DIAGNOSIS — R52 Pain, unspecified: Secondary | ICD-10-CM

## 2019-10-19 DIAGNOSIS — Z0189 Encounter for other specified special examinations: Secondary | ICD-10-CM

## 2019-10-19 DIAGNOSIS — I10 Essential (primary) hypertension: Secondary | ICD-10-CM | POA: Diagnosis present

## 2019-10-19 DIAGNOSIS — D630 Anemia in neoplastic disease: Secondary | ICD-10-CM | POA: Diagnosis present

## 2019-10-19 DIAGNOSIS — S72009A Fracture of unspecified part of neck of unspecified femur, initial encounter for closed fracture: Secondary | ICD-10-CM | POA: Diagnosis present

## 2019-10-19 DIAGNOSIS — E871 Hypo-osmolality and hyponatremia: Secondary | ICD-10-CM | POA: Diagnosis present

## 2019-10-19 DIAGNOSIS — K56609 Unspecified intestinal obstruction, unspecified as to partial versus complete obstruction: Secondary | ICD-10-CM | POA: Diagnosis not present

## 2019-10-19 DIAGNOSIS — M25551 Pain in right hip: Secondary | ICD-10-CM

## 2019-10-19 DIAGNOSIS — C7951 Secondary malignant neoplasm of bone: Secondary | ICD-10-CM | POA: Diagnosis present

## 2019-10-19 DIAGNOSIS — F17210 Nicotine dependence, cigarettes, uncomplicated: Secondary | ICD-10-CM | POA: Diagnosis present

## 2019-10-19 DIAGNOSIS — E785 Hyperlipidemia, unspecified: Secondary | ICD-10-CM | POA: Diagnosis present

## 2019-10-19 DIAGNOSIS — Z8546 Personal history of malignant neoplasm of prostate: Secondary | ICD-10-CM

## 2019-10-19 DIAGNOSIS — Z79818 Long term (current) use of other agents affecting estrogen receptors and estrogen levels: Secondary | ICD-10-CM

## 2019-10-19 DIAGNOSIS — C7801 Secondary malignant neoplasm of right lung: Secondary | ICD-10-CM | POA: Diagnosis present

## 2019-10-19 DIAGNOSIS — K5903 Drug induced constipation: Secondary | ICD-10-CM | POA: Diagnosis present

## 2019-10-19 DIAGNOSIS — D6481 Anemia due to antineoplastic chemotherapy: Secondary | ICD-10-CM | POA: Diagnosis present

## 2019-10-19 DIAGNOSIS — T451X5A Adverse effect of antineoplastic and immunosuppressive drugs, initial encounter: Secondary | ICD-10-CM | POA: Diagnosis present

## 2019-10-19 DIAGNOSIS — Z823 Family history of stroke: Secondary | ICD-10-CM

## 2019-10-19 DIAGNOSIS — D72829 Elevated white blood cell count, unspecified: Secondary | ICD-10-CM

## 2019-10-19 DIAGNOSIS — Z20828 Contact with and (suspected) exposure to other viral communicable diseases: Secondary | ICD-10-CM | POA: Diagnosis present

## 2019-10-19 DIAGNOSIS — Z8719 Personal history of other diseases of the digestive system: Secondary | ICD-10-CM

## 2019-10-19 DIAGNOSIS — E875 Hyperkalemia: Secondary | ICD-10-CM | POA: Diagnosis present

## 2019-10-19 DIAGNOSIS — J90 Pleural effusion, not elsewhere classified: Secondary | ICD-10-CM | POA: Diagnosis not present

## 2019-10-19 DIAGNOSIS — Z419 Encounter for procedure for purposes other than remedying health state, unspecified: Secondary | ICD-10-CM

## 2019-10-19 DIAGNOSIS — E876 Hypokalemia: Secondary | ICD-10-CM | POA: Diagnosis not present

## 2019-10-19 DIAGNOSIS — Z7951 Long term (current) use of inhaled steroids: Secondary | ICD-10-CM

## 2019-10-19 DIAGNOSIS — Z800 Family history of malignant neoplasm of digestive organs: Secondary | ICD-10-CM

## 2019-10-19 DIAGNOSIS — Z79899 Other long term (current) drug therapy: Secondary | ICD-10-CM

## 2019-10-19 LAB — URINALYSIS, ROUTINE W REFLEX MICROSCOPIC
Bilirubin Urine: NEGATIVE
Glucose, UA: NEGATIVE mg/dL
Hgb urine dipstick: NEGATIVE
Ketones, ur: NEGATIVE mg/dL
Leukocytes,Ua: NEGATIVE
Nitrite: NEGATIVE
Protein, ur: NEGATIVE mg/dL
Specific Gravity, Urine: 1.011 (ref 1.005–1.030)
pH: 5 (ref 5.0–8.0)

## 2019-10-19 LAB — CBC WITH DIFFERENTIAL/PLATELET
Abs Immature Granulocytes: 0.09 10*3/uL — ABNORMAL HIGH (ref 0.00–0.07)
Basophils Absolute: 0 10*3/uL (ref 0.0–0.1)
Basophils Relative: 0 %
Eosinophils Absolute: 0.1 10*3/uL (ref 0.0–0.5)
Eosinophils Relative: 0 %
HCT: 34.4 % — ABNORMAL LOW (ref 39.0–52.0)
Hemoglobin: 11.3 g/dL — ABNORMAL LOW (ref 13.0–17.0)
Immature Granulocytes: 1 %
Lymphocytes Relative: 6 %
Lymphs Abs: 1.2 10*3/uL (ref 0.7–4.0)
MCH: 33.6 pg (ref 26.0–34.0)
MCHC: 32.8 g/dL (ref 30.0–36.0)
MCV: 102.4 fL — ABNORMAL HIGH (ref 80.0–100.0)
Monocytes Absolute: 1.1 10*3/uL — ABNORMAL HIGH (ref 0.1–1.0)
Monocytes Relative: 6 %
Neutro Abs: 15.9 10*3/uL — ABNORMAL HIGH (ref 1.7–7.7)
Neutrophils Relative %: 87 %
Platelets: 425 10*3/uL — ABNORMAL HIGH (ref 150–400)
RBC: 3.36 MIL/uL — ABNORMAL LOW (ref 4.22–5.81)
RDW: 12.7 % (ref 11.5–15.5)
WBC: 18.4 10*3/uL — ABNORMAL HIGH (ref 4.0–10.5)
nRBC: 0 % (ref 0.0–0.2)

## 2019-10-19 LAB — BASIC METABOLIC PANEL
Anion gap: 8 (ref 5–15)
BUN: 29 mg/dL — ABNORMAL HIGH (ref 8–23)
CO2: 22 mmol/L (ref 22–32)
Calcium: 9.1 mg/dL (ref 8.9–10.3)
Chloride: 97 mmol/L — ABNORMAL LOW (ref 98–111)
Creatinine, Ser: 1.08 mg/dL (ref 0.61–1.24)
GFR calc Af Amer: >60 mL/min (ref >60)
GFR calc non Af Amer: >60 mL/min (ref >60)
Glucose, Bld: 108 mg/dL — ABNORMAL HIGH (ref 70–99)
Potassium: 5.4 mmol/L — ABNORMAL HIGH (ref 3.5–5.1)
Sodium: 127 mmol/L — ABNORMAL LOW (ref 135–145)

## 2019-10-19 LAB — NOVEL CORONAVIRUS, NAA (HOSP ORDER, SEND-OUT TO REF LAB; TAT 18-24 HRS): SARS-CoV-2, NAA: NOT DETECTED

## 2019-10-19 LAB — HIV ANTIBODY (ROUTINE TESTING W REFLEX): HIV Screen 4th Generation wRfx: NONREACTIVE

## 2019-10-19 MED ORDER — LORAZEPAM 2 MG/ML IJ SOLN
1.0000 mg | INTRAMUSCULAR | Status: DC | PRN
  Filled 2019-10-19: qty 1

## 2019-10-19 MED ORDER — MORPHINE SULFATE (PF) 4 MG/ML IV SOLN
8.0000 mg | Freq: Once | INTRAVENOUS | Status: AC
  Administered 2019-10-19: 8 mg via INTRAVENOUS
  Filled 2019-10-19: qty 2

## 2019-10-19 MED ORDER — DOCUSATE SODIUM 100 MG PO CAPS
200.0000 mg | ORAL_CAPSULE | Freq: Every day | ORAL | Status: DC
  Administered 2019-10-19 – 2019-10-21 (×3): 200 mg via ORAL
  Filled 2019-10-19 (×4): qty 2

## 2019-10-19 MED ORDER — ALBUTEROL SULFATE (2.5 MG/3ML) 0.083% IN NEBU
3.0000 mL | INHALATION_SOLUTION | RESPIRATORY_TRACT | Status: DC | PRN
  Administered 2019-10-23: 3 mL via RESPIRATORY_TRACT
  Filled 2019-10-19: qty 3

## 2019-10-19 MED ORDER — ENOXAPARIN SODIUM 40 MG/0.4ML ~~LOC~~ SOLN
40.0000 mg | SUBCUTANEOUS | Status: DC
  Administered 2019-10-19 – 2019-10-21 (×3): 40 mg via SUBCUTANEOUS
  Filled 2019-10-19 (×3): qty 0.4

## 2019-10-19 MED ORDER — HYDROMORPHONE HCL 1 MG/ML IJ SOLN
1.0000 mg | Freq: Once | INTRAMUSCULAR | Status: AC
  Administered 2019-10-19: 1 mg via INTRAVENOUS
  Filled 2019-10-19: qty 1

## 2019-10-19 MED ORDER — MORPHINE SULFATE (PF) 4 MG/ML IV SOLN
8.0000 mg | INTRAVENOUS | Status: DC | PRN
  Administered 2019-10-19 – 2019-10-20 (×4): 8 mg via INTRAVENOUS
  Filled 2019-10-19 (×5): qty 2

## 2019-10-19 MED ORDER — MOMETASONE FURO-FORMOTEROL FUM 200-5 MCG/ACT IN AERO
2.0000 | INHALATION_SPRAY | Freq: Two times a day (BID) | RESPIRATORY_TRACT | Status: DC
  Administered 2019-10-21 – 2019-10-30 (×17): 2 via RESPIRATORY_TRACT
  Filled 2019-10-19 (×3): qty 8.8

## 2019-10-19 MED ORDER — OXYCODONE-ACETAMINOPHEN 5-325 MG PO TABS: 1.0000 | ORAL_TABLET | Freq: Three times a day (TID) | ORAL | Status: DC | PRN

## 2019-10-19 MED ORDER — HYDROMORPHONE HCL 1 MG/ML IJ SOLN
1.0000 mg | INTRAMUSCULAR | Status: DC | PRN
  Administered 2019-10-19 – 2019-10-22 (×5): 1 mg via INTRAVENOUS
  Filled 2019-10-19 (×5): qty 1

## 2019-10-19 MED ORDER — AMITRIPTYLINE HCL 50 MG PO TABS
75.0000 mg | ORAL_TABLET | Freq: Every day | ORAL | Status: DC
  Administered 2019-10-19 – 2019-10-23 (×5): 75 mg via ORAL
  Filled 2019-10-19 (×2): qty 1
  Filled 2019-10-19: qty 3
  Filled 2019-10-19 (×2): qty 1

## 2019-10-19 MED ORDER — BISACODYL 5 MG PO TBEC
10.0000 mg | DELAYED_RELEASE_TABLET | Freq: Every day | ORAL | Status: DC
  Administered 2019-10-20 – 2019-10-23 (×3): 10 mg via ORAL
  Filled 2019-10-19 (×5): qty 2

## 2019-10-19 MED ORDER — OXYCODONE-ACETAMINOPHEN 5-325 MG PO TABS
1.0000 | ORAL_TABLET | ORAL | Status: DC | PRN
  Administered 2019-10-20 (×2): 2 via ORAL
  Filled 2019-10-19 (×2): qty 2

## 2019-10-19 MED ORDER — ENZALUTAMIDE 40 MG PO CAPS
160.0000 mg | ORAL_CAPSULE | Freq: Every day | ORAL | Status: DC
  Administered 2019-10-20 – 2019-10-23 (×4): 160 mg via ORAL
  Filled 2019-10-19 (×4): qty 4

## 2019-10-19 MED ORDER — LISINOPRIL 20 MG PO TABS: 20.0000 mg | ORAL_TABLET | Freq: Every day | ORAL | Status: DC

## 2019-10-19 MED ORDER — ATORVASTATIN CALCIUM 10 MG PO TABS
20.0000 mg | ORAL_TABLET | Freq: Every day | ORAL | Status: DC
  Administered 2019-10-19 – 2019-10-23 (×5): 20 mg via ORAL
  Filled 2019-10-19 (×5): qty 2
  Filled 2019-10-19: qty 1

## 2019-10-19 MED ORDER — SODIUM CHLORIDE 0.9 % IV SOLN
INTRAVENOUS | Status: DC
  Administered 2019-10-19 – 2019-10-21 (×6): via INTRAVENOUS
  Administered 2019-10-21: 125 mL/h via INTRAVENOUS

## 2019-10-19 MED ORDER — SODIUM CHLORIDE 0.9 % IV SOLN: INTRAVENOUS | Status: AC

## 2019-10-19 MED ORDER — LORAZEPAM 2 MG/ML IJ SOLN
0.5000 mg | Freq: Once | INTRAMUSCULAR | Status: AC
  Administered 2019-10-19: 0.5 mg via INTRAVENOUS
  Filled 2019-10-19: qty 1

## 2019-10-19 NOTE — ED Notes (Signed)
Pt with xray 

## 2019-10-19 NOTE — H&P (Addendum)
History and Physical    Anthony Escobar MLY:[id number] DOB: 1951/06/14 DOA: 10/19/2019  PCP: [name], MD Patient coming from: Home  Chief Complaint: Right hip pain  HPI: Anthony Escobar is a 68 y.o. 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  He went to his PCP got Toradol and Celebrex which did not help.  He saw Dr. Erlinda Hong  10/17/2019 as MRI revealed pathological intertrochanteric fracture of the right hip.  He was planning to have active surgical stabilization on Monday, 10/22/2019.  However after lifting a big pot of plant his pain got worse and he decided to come to the ER. He complains of constipation has not had a bowel movement in 2 days this is gotten worse since he is taking  Percocet at home.  He denies any fever chills nausea vomiting diarrhea chest pain abdominal pain.  He has history of COPD and continues to smoke he is not on oxygen at home.  He does get dyspnea on exertion due to COPD.  He is not aware of having a history of CAD nor has been checked for it.  His last echo was in 2018 with normal ejection fraction.  His granddaughter works as a Marine scientist in [employer].  ED Course: Patient received 8 mg of morphine along with 1 mg Dilaudid without any significant improvement.  Blood pressure 1 34/82, pulse 96-1 01, afebrile 97.7, 99% on room air. Covid pending  Sodium 127 potassium 5.4 BUN 29 creatinine 1.08 white count 18.4 hemoglobin 11.3 platelet count four 2512.8  Review of Systems: As per HPI otherwise all other systems reviewed and are negative  Ambulatory Status: He is very active at baseline  Past Medical History:  Diagnosis Date  . Hyperlipidemia   . Hypertension     Past Surgical History:  Procedure Laterality Date  . HERNIA REPAIR  1988    bilateral inguinal    Social History   Socioeconomic History  . Marital status: Single    Spouse name: Not on file  . Number of children: Not on file  . Years of education: Not on file  . Highest education level: Not on file  Occupational History  . Not on file  Social Needs  . Financial resource strain: Not on file  . Food insecurity    Worry: Not on file    Inability: Not on file  . Transportation needs    Medical: Not on file    Non-medical: Not on file  Tobacco Use  . Smoking status: Former Smoker    Packs/day: 1.50    Years: 50.00    Pack years: 75.00    Types: Cigarettes    Quit date: 08/20/2011    Years since quitting: 8.1  . Smokeless tobacco: Never Used  Substance and Sexual Activity  . Alcohol use: Yes    Comment: 1beer a day  . Drug use: No  . Sexual activity: Not on file  Lifestyle  . Physical activity    Days per week: Not on file    Minutes per session: Not on file  . Stress: Not on file  Relationships  . Social Herbalist on phone: Not on file    Gets together: Not on file    Attends religious service: Not on file  Active member of club or organization: Not on file    Attends meetings of clubs or organizations: Not on file    Relationship status: Not on file  . Intimate partner violence    Fear of current or ex partner: Not on file    Emotionally abused: Not on file    Physically abused: Not on file    Forced sexual activity: Not on file  Other Topics Concern  . Not on file  Social History Narrative  . Not on file    No Known Allergies  Family History  Problem Relation Age of Onset  . Stroke Mother   . Cancer Father        esophageal    Prior to Admission medications   Medication Sig Start Date End Date Taking? Authorizing Provider  albuterol (PROAIR HFA) 108 (90 Base) MCG/ACT inhaler Inhale 2 puffs into the lungs every 4 (four) hours as needed for wheezing or shortness of breath. 03/14/18   Collene Gobble, MD   amitriptyline (ELAVIL) 75 MG tablet Take 75 mg by mouth at bedtime.    [provider]  atorvastatin (LIPITOR) 10 MG tablet Take 10 mg by mouth daily.    [provider]  celecoxib (CELEBREX) 200 MG capsule  10/12/19   [provider]  HYDROcodone-acetaminophen (NORCO) 5-325 MG tablet 1/2 to 1 tablet every 6 hours as needed for pain 10/15/19   Sandi Mealy E., PA-C  lisinopril (PRINIVIL,ZESTRIL) 10 MG tablet Take 10 mg by mouth daily.  09/14/13   [provider]  oxyCODONE-acetaminophen (PERCOCET) 5-325 MG tablet Take 1-2 tablets by mouth every 8 (eight) hours as needed for severe pain. 10/17/19   Leandrew Koyanagi, MD  prochlorperazine (COMPAZINE) 10 MG tablet Take 1 tablet (10 mg total) by mouth every 6 (six) hours as needed for nausea or vomiting. 01/19/18   [name], MD  Tiotropium Bromide Monohydrate (SPIRIVA RESPIMAT) 2.5 MCG/ACT AERS Inhale 2 puffs into the lungs daily. 12/15/18   Collene Gobble, MD  XTANDI 40 MG capsule Take 4 capsules (160 mg total) by mouth daily. 10/02/19   [name], MD    Physical Exam: Vitals:   10/19/19 1216 10/19/19 1217 10/19/19 1330 10/19/19 1344  BP: (!) 138/92  128/77 128/77  Pulse: 95  97 (!) 102  Resp: 19   17  Temp: 97.7 F (36.5 C)     TempSrc: Oral     SpO2: 98%  96% 95%  Weight:  71.2 kg    Height:  5\' 8"  (1.727 m)       . General:  Appears in mild distress due to pain . Eyes:  PERRL, EOMI, normal lids, iris . ENT:  grossly normal hearing, lips & tongue, mmm . Neck:  no LAD, masses or thyromegaly . Cardiovascular:  RRR, no m/r/g. No LE edema.  Marland Kitchen Respiratory: Few scattered wheezes bilaterally, no w/r/r. Normal respiratory effort. . Abdomen: soft, nontender distended good bowel sounds . Skin:  no rash or induration seen on limited exam . Musculoskeletal: Right leg shorter right hip swollen and tender . Psychiatric:  grossly normal mood and affect, speech fluent and appropriate, AOx3 . Neurologic:   CN 2-12 grossly intact, moves all extremities in coordinated fashion, sensation intact  Labs on Admission: I have personally reviewed following labs and imaging studies  CBC: Recent Labs  Lab 10/16/19 0824 10/19/19 1300  WBC 9.1 18.4*  NEUTROABS  --  15.9*  HGB 13.1 11.3*  HCT  39.0 34.4*  MCV 100.0 102.4*  PLT 533* 878*   Basic Metabolic Panel: Recent Labs  Lab 10/16/19 0824 10/19/19 1300  NA 136 127*  K 4.3 5.4*  CL 101 97*  CO2 23 22  GLUCOSE 112* 108*  BUN 23 29*  CREATININE 1.20 1.08  CALCIUM 9.9 9.1   GFR: Estimated Creatinine Clearance: 63.3 mL/min (by C-G formula based on SCr of 1.08 mg/dL). Liver Function Tests: Recent Labs  Lab 10/16/19 0824  AST 21  ALT 9  ALKPHOS 114  BILITOT 0.2*  PROT 7.5  ALBUMIN 3.9   No results for input(s): LIPASE, AMYLASE in the last 168 hours. No results for input(s): AMMONIA in the last 168 hours. Coagulation Profile: No results for input(s): INR, PROTIME in the last 168 hours. Cardiac Enzymes: No results for input(s): CKTOTAL, CKMB, CKMBINDEX, TROPONINI in the last 168 hours. BNP (last 3 results) No results for input(s): PROBNP in the last 8760 hours. HbA1C: No results for input(s): HGBA1C in the last 72 hours. CBG: No results for input(s): GLUCAP in the last 168 hours. Lipid Profile: No results for input(s): CHOL, HDL, LDLCALC, TRIG, CHOLHDL, LDLDIRECT in the last 72 hours. Thyroid Function Tests: No results for input(s): TSH, T4TOTAL, FREET4, T3FREE, THYROIDAB in the last 72 hours. Anemia Panel: No results for input(s): VITAMINB12, FOLATE, FERRITIN, TIBC, IRON, RETICCTPCT in the last 72 hours. Urine analysis: No results found for: COLORURINE, APPEARANCEUR, LABSPEC, PHURINE, GLUCOSEU, HGBUR, BILIRUBINUR, KETONESUR, PROTEINUR, UROBILINOGEN, NITRITE, LEUKOCYTESUR  Creatinine Clearance: Estimated Creatinine Clearance: 63.3 mL/min (by C-G formula based on SCr of 1.08 mg/dL).  Sepsis  Labs: @LABRCNTIP (procalcitonin:4,lacticidven:4) )No results found for this or any previous visit (from the past 240 hour(s)).   Radiological Exams on Admission: No results found.  EKG pending  Assessment/Plan Active Problems:   * No active hospital problems. *    #1  Pathological fracture of the right intertrochanteric hip fracture-patient with history of metastatic prostate cancer with mets to the bones.  Patient was due to have elective right hip surgery 10/22/2019.  He decided to come to the ER due to worsening pain after lifting a big pot plant.  He describes the pain as 10 out of 10 he received 8 mg of morphine and 1 mg of Dilaudid in the ER without any significant relief.  I have discussed with Dr. Erlinda Hong plan is still for OR on Monday at [facility].  Patient will be [facility] to [facility].    Addendum x-ray hip with mixed lytic and sclerotic lesion involving the proximal right femur with evidence of acute mildly impacted fracture involving the base of the femoral neck consistent with pathologic fracture  Patient has no known history of CAD documented.  His last echo was in 2018 with normal ejection fraction.  He is pretty active at home.  He has shortness of breath from COPD. Check EKG. I would not think he needs any further cardiac work-up prior to surgery.  N.p.o. after midnight Sunday night 10/21/2019 Please give Lovenox Sunday morning 10/21/2019 for DVT prophylaxis No Lovenox after 12 noon on Sunday  #2 acute hyponatremia sodium 127 on admission possibly related to pain.  Sodium last month was 136.  Follow-up in a.m. if still hyponatremic check urine and serum osmolality and urine sodium.  #3 hyperkalemia potassium 5.4 patient takes ACE inhibitor at home which I will hold recheck labs in the morning.  #4 leukocytosis possibly reactive no signs of infection noted.  Chest x-ray pending UA pending  #5 history of  COPD continue Spiriva, incentive spirometry, albuterol nebulizer.  #6  constipation stool softeners  Severity of Illness: The appropriate patient status for this patient is INPATIENT. Inpatient status is judged to be reasonable and necessary in order to provide the required intensity of service to ensure the patient's safety. The patient's presenting symptoms, physical exam findings, and initial radiographic and laboratory data in the context of their chronic comorbidities is felt to place them at high risk for further clinical deterioration. Furthermore, it is not anticipated that the patient will be medically stable for discharge from the hospital within 2 midnights of admission. The following factors support the patient status of inpatient.   " The patient's presenting symptoms include right hip pain. " The worrisome physical exam findings include right hip swollen right leg shorter than the left " The initial radiographic and laboratory data are worrisome because of displaced fracture of the right hip " The chronic co-morbidities include metastatic prostate cancer COPD   * I certify that at the point of admission it is my clinical judgment that the patient will require inpatient hospital care spanning beyond 2 midnights from the point of admission due to high intensity of service, high risk for further deterioration and high frequency of surveillance required.*    Estimated body mass index is 23.87 kg/m as calculated from the following:   Height as of this encounter: 5\' 8"  (1.727 m).   Weight as of this encounter: 71.2 kg.   DVT prophylaxis: Lovenox please DC Lovenox Sunday after he gets his Lovenox shots Sunday morning he should not get any Lovenox past noon on Sunday, 10/21/2019 Code Status: Full code Family Communication: Discussed with the granddaughter in the room Disposition Plan: Patient being admitted to [facility] for surgery right hip fracture on Monday Consults called: Ortho Dr. Erlinda Hong Admission status: Inpatient   Georgette Shell MD [facility]  If 7PM-7AM, please contact night-coverage [email or web address] Password [email or web address]  10/19/2019, 2:44 PM

## 2019-10-19 NOTE — ED Notes (Signed)
Pt states pain 3/10, he would like to wait on additional pain medications. Patient is comfortable and eating dinner. Family at bedside

## 2019-10-19 NOTE — Progress Notes (Signed)
Called pt for pre-op call and he states he's in [place] ED and thinks he may be having surgery today. I told him I would check later and see if he gets admitted and if he doesn't I will call him back later to day.

## 2019-10-19 NOTE — ED Notes (Signed)
ED TO INPATIENT HANDOFF REPORT  ED Nurse Name and Phone #: Jeoffrey Massed Name/Age/Gender Anthony Escobar 68 y.o. male Room/Bed: WA14/WA14  Code Status   Code Status: Full Code  Home/SNF/Other home Patient oriented to: self, place, time and situation Is this baseline? Yes   Triage Complete: Triage complete  Chief Complaint hip pain  Triage Note Per [facility] pt from home for worsening right hip pains that got worse today. Scheduled to have surgery on hip on Monday. Taken his pain medications without any relief. Unable to bear weight on leg. Pt adds "my leg feels numb".    Allergies No Known Allergies  Level of Care/Admitting Diagnosis ED Disposition    ED Disposition Condition [facility] Area: [place] [100100]  Level of Care: Med-Surg [16]  Covid Evaluation: Asymptomatic Screening Protocol (No Symptoms)  Diagnosis: Hip fracture [facility]) [469629]  Admitting Physician: Georgette Shell [[id number]]  Attending Physician: Georgette Shell [[id number]]  Estimated length of stay: 3 - 4 days  Certification:: I certify this patient will need inpatient services for at least 2 midnights  PT Class (Do Not Modify): Inpatient [101]  PT Acc Code (Do Not Modify): Private [1]       B Medical/Surgery History Past Medical History:  Diagnosis Date  . Cancer ([place])   . Hyperlipidemia   . Hypertension    Past Surgical History:  Procedure Laterality Date  . HERNIA REPAIR  1988   bilateral inguinal     A IV Location/Drains/Wounds Patient Lines/Drains/Airways Status   Active Line/Drains/Airways    Name:   Placement date:   Placement time:   Site:   Days:   Peripheral IV 10/19/19 Right Antecubital   10/19/19    1234    Antecubital   less than 1          Intake/Output Last 24 hours No intake or output data in the 24 hours ending 10/19/19 2012  Labs/Imaging Results for orders placed or performed during the hospital encounter of 10/19/19 (from  the past 48 hour(s))  Basic metabolic panel     Status: Abnormal   Collection Time: 10/19/19  1:00 PM  Result Value Ref Range   Sodium 127 (L) 135 - 145 mmol/L   Potassium 5.4 (H) 3.5 - 5.1 mmol/L   Chloride 97 (L) 98 - 111 mmol/L   CO2 22 22 - 32 mmol/L   Glucose, Bld 108 (H) 70 - 99 mg/dL   BUN 29 (H) 8 - 23 mg/dL   Creatinine, Ser 1.08 0.61 - 1.24 mg/dL   Calcium 9.1 8.9 - 10.3 mg/dL   GFR calc non Af Amer >60 >60 mL/min   GFR calc Af Amer >60 >60 mL/min   Anion gap 8 5 - 15    Comment: Performed at [facility], [place] [address] [place], [place] [address]  CBC with Differential     Status: Abnormal   Collection Time: 10/19/19  1:00 PM  Result Value Ref Range   WBC 18.4 (H) 4.0 - 10.5 K/uL   RBC 3.36 (L) 4.22 - 5.81 MIL/uL   Hemoglobin 11.3 (L) 13.0 - 17.0 g/dL   HCT 34.4 (L) 39.0 - 52.0 %   MCV 102.4 (H) 80.0 - 100.0 fL   MCH 33.6 26.0 - 34.0 pg   MCHC 32.8 30.0 - 36.0 g/dL   RDW 12.7 11.5 - 15.5 %   Platelets 425 (H) 150 - 400 K/uL   nRBC 0.0  0.0 - 0.2 %   Neutrophils Relative % 87 %   Neutro Abs 15.9 (H) 1.7 - 7.7 K/uL   Lymphocytes Relative 6 %   Lymphs Abs 1.2 0.7 - 4.0 K/uL   Monocytes Relative 6 %   Monocytes Absolute 1.1 (H) 0.1 - 1.0 K/uL   Eosinophils Relative 0 %   Eosinophils Absolute 0.1 0.0 - 0.5 K/uL   Basophils Relative 0 %   Basophils Absolute 0.0 0.0 - 0.1 K/uL   Immature Granulocytes 1 %   Abs Immature Granulocytes 0.09 (H) 0.00 - 0.07 K/uL    Comment: Performed at [facility], [place] [address], [place], [place] [address]  Urinalysis, Routine w reflex microscopic     Status: None   Collection Time: 10/19/19  4:09 PM  Result Value Ref Range   Color, Urine YELLOW YELLOW   APPearance CLEAR CLEAR   Specific Gravity, Urine 1.011 1.005 - 1.030   pH 5.0 5.0 - 8.0   Glucose, UA NEGATIVE NEGATIVE mg/dL   Hgb urine dipstick NEGATIVE NEGATIVE   Bilirubin Urine NEGATIVE NEGATIVE   Ketones, ur NEGATIVE NEGATIVE mg/dL    Protein, ur NEGATIVE NEGATIVE mg/dL   Nitrite NEGATIVE NEGATIVE   Leukocytes,Ua NEGATIVE NEGATIVE    Comment: Performed at [facility] [address], [place], [place] [address]   Dg Chest 1 View  Result Date: 10/19/2019 CLINICAL DATA:  Leukocytosis. EXAM: CHEST  1 VIEW COMPARISON:  October 18, 2017. FINDINGS: The heart size and mediastinal contours are within normal limits. Pulmonary nodules noted on prior radiograph are not well visualized currently. No consolidative process is noted. The visualized skeletal structures are unremarkable. IMPRESSION: No active disease. Electronically Signed   By: Marijo Conception M.D.   On: 10/19/2019 15:54   Dg Hip Unilat With Pelvis 1v Right  Result Date: 10/19/2019 CLINICAL DATA:  Right hip pain EXAM: DG HIP (WITH OR WITHOUT PELVIS) 1V RIGHT COMPARISON:  MRI 10/16/2019, CT 02/28/2019 FINDINGS: Mixed lytic and sclerotic lesion involving the right femoral head, neck, trochanter and proximal shaft. Acute slightly impacted appearing fracture involving the base of the femoral neck. No femoral head dislocation. Left hip demonstrates normal alignment. IMPRESSION: Mixed lytic and sclerotic lesion involving the proximal right femur, with evidence of acute mildly impacted fracture involving the base of the femoral neck, consistent with pathologic fracture. Electronically Signed   By: Donavan Foil M.D.   On: 10/19/2019 15:22    Pending Labs Unresulted Labs (From admission, onward)    Start     Ordered   10/20/19 0500  Comprehensive metabolic panel  Tomorrow morning,   R     10/19/19 1503   10/20/19 0500  CBC  Tomorrow morning,   R     10/19/19 1503   10/19/19 1501  HIV Antibody (routine testing w rflx)  (HIV Antibody (Routine testing w reflex) panel)  Once,   STAT     10/19/19 1503          Vitals/Pain Today's Vitals   10/19/19 1600 10/19/19 1700 10/19/19 1716 10/19/19 1926  BP: 103/78 135/87 135/87 111/72  Pulse: 99 (!) 102 (!) 103 94   Resp: 16 18 18 11   Temp:      TempSrc:      SpO2: 95% 100% 99% 95%  Weight:      Height:      PainSc: Asleep  3      Isolation Precautions No active isolations  Medications Medications  morphine  4 MG/ML injection 8 mg (8 mg Intravenous Given 10/19/19 1338)  0.9 %  sodium chloride infusion ( Intravenous New Bag/Given (Non-Interop) 10/19/19 1450)  enoxaparin (LOVENOX) injection 40 mg (40 mg Subcutaneous Given 10/19/19 1722)  albuterol (PROVENTIL) (2.5 MG/3ML) 0.083% nebulizer solution 3 mL (has no administration in time range)  amitriptyline (ELAVIL) tablet 75 mg (has no administration in time range)  atorvastatin (LIPITOR) tablet 20 mg (20 mg Oral Given 10/19/19 1720)  mometasone-formoterol (DULERA) 200-5 MCG/ACT inhaler 2 puff (has no administration in time range)  enzalutamide (XTANDI) capsule 160 mg (has no administration in time range)  docusate sodium (COLACE) capsule 200 mg (has no administration in time range)  bisacodyl (DULCOLAX) EC tablet 10 mg (has no administration in time range)  0.9 %  sodium chloride infusion (has no administration in time range)  HYDROmorphone (DILAUDID) injection 1 mg (has no administration in time range)  LORazepam (ATIVAN) injection 1 mg (has no administration in time range)  oxyCODONE-acetaminophen (PERCOCET/ROXICET) 5-325 MG per tablet 1-2 tablet (has no administration in time range)  HYDROmorphone (DILAUDID) injection 1 mg (1 mg Intravenous Given 10/19/19 1233)  morphine 4 MG/ML injection 8 mg (8 mg Intravenous Given 10/19/19 1257)  LORazepam (ATIVAN) injection 0.5 mg (0.5 mg Intravenous Given 10/19/19 1457)    Mobility non-ambulatory High fall risk   Focused Assessments Cardiac Assessment Handoff:    No results found for: CKTOTAL, CKMB, CKMBINDEX, TROPONINI No results found for: DDIMER Does the Patient currently have chest pain? No   , Neuro Assessment Handoff:  Swallow screen pass? Yes          Neuro Assessment:   Neuro  Checks:      Last Documented NIHSS Modified Score:   Has TPA been given? No If patient is a Neuro Trauma and patient is going to OR before floor call report to [place] nurse: [phone] or [phone]  , Pulmonary Assessment Handoff:  Lung sounds:   O2 Device: Room Air     ,    R Recommendations: See Admitting Provider Note  Report given to:   Additional Notes:

## 2019-10-19 NOTE — ED Notes (Signed)
Pt is done with dinner and is now asleep. Will continue to monitor for need of pain medication.

## 2019-10-19 NOTE — ED Notes (Signed)
Called report to [facility]. [facility] called for patient transport

## 2019-10-19 NOTE — Progress Notes (Signed)
Histology and Location of Primary Cancer: Prostate cancer diagnosed in 2019. Gleason score 4+4 = 8 and PSA of 19.57 at the time of diagnosis  Sites of Visceral and Bony Metastatic Disease: bony  Location(s) of Symptomatic Metastases: impending pathologic fracture of right intertrochanteric region  Past/Anticipated chemotherapy by medical oncology, if any:  Taxotere chemotherapy with the first cycle of chemotherapy given on February 02, 2018.  He completed 6 cycles of therapy on May 18, 2018.   Current therapy:  Androgen deprivation therapy with Lupron.  He received Lupron 30 mg in June 14, 2019.  Xtandi 160 mg daily started in March of 2020.  He has tolerated Xtandi with excellent PSA response at this time.  His PSA on June 18 was down to 0.6.   Pain on a scale of 0-10 is: Reports severe pain with any weightbearing and with movement of his right leg and hip. Taking Aleve, Celebrex and hydrocodone for pain management.   If Spine Met(s), symptoms, if any, include:  Bowel/Bladder retention or incontinence (please describe):   Numbness or weakness in extremities (please describe):   Current Decadron regimen, if applicable: no  Ambulatory status? Walker? Wheelchair?: Ambulating gingerly with the aid of crutches.   SAFETY ISSUES:  Prior radiation?   Pacemaker/ICD?   Possible current pregnancy? no, male patient  Is the patient on methotrexate?  Current Complaints / other details:  68 year old male.

## 2019-10-19 NOTE — ED Notes (Signed)
Pt asleep and resting comfortably.

## 2019-10-19 NOTE — ED Triage Notes (Signed)
Per [facility] pt from home for worsening right hip pains that got worse today. Scheduled to have surgery on hip on Monday. Taken his pain medications without any relief. Unable to bear weight on leg. Pt adds "my leg feels numb".

## 2019-10-19 NOTE — ED Provider Notes (Signed)
[facility] DEPT Provider Note   CSN: [id number] Arrival date & time: 10/19/19  1204     History   Chief Complaint Chief Complaint  Patient presents with  . Hip Pain    HPI Anthony Escobar is a 68 y.o. male.     HPI Patient presents with concern of ongoing, worsening pain in his right hip. Patient has history of metastatic prostate cancer with known bone involvement. 3 days ago the patient had MRI performed due to worsening pain, was diagnosed with pathologic fracture of the right hip, with multiple lytic lesions. Patient is scheduled for surgery next week. He notes that in spite of attempting to remain essentially immobilized, using crutches at home he has had worsening pain, without additional falls. Today, after attempting to transfer from a recliner, using crutches, he had pain is severe enough that he had to go to the ground. No fall. Pain is severe not improved with oxycodone, Percocet, and after initial intervention Dilaudid. No loss of sensation distally, there is some question about the patient's pulses being appreciable in the right foot on arrival. Past Medical History:  Diagnosis Date  . Hyperlipidemia   . Hypertension     Patient Active Problem List   Diagnosis Date Noted  . Pathologic intertrochanteric fracture, right, initial encounter ([place]) 10/17/2019  . Acute bronchitis 12/15/2018  . Encounter for antineoplastic chemotherapy 04/27/2018  . Prostate cancer ([place]) 01/19/2018  . Multiple pulmonary nodules 10/28/2017  . COPD (chronic obstructive pulmonary disease) ([place]) 10/18/2017  . Tobacco use 10/18/2017    Past Surgical History:  Procedure Laterality Date  . HERNIA REPAIR  1988   bilateral inguinal        Home Medications    Prior to Admission medications   Medication Sig Start Date End Date Taking? Authorizing Provider  albuterol (PROAIR HFA) 108 (90 Base) MCG/ACT inhaler Inhale 2 puffs into the lungs every 4  (four) hours as needed for wheezing or shortness of breath. 03/14/18   Collene Gobble, MD  amitriptyline (ELAVIL) 75 MG tablet Take 75 mg by mouth at bedtime.    [provider]  atorvastatin (LIPITOR) 10 MG tablet Take 10 mg by mouth daily.    [provider]  celecoxib (CELEBREX) 200 MG capsule  10/12/19   [provider]  HYDROcodone-acetaminophen (NORCO) 5-325 MG tablet 1/2 to 1 tablet every 6 hours as needed for pain 10/15/19   Sandi Mealy E., PA-C  lisinopril (PRINIVIL,ZESTRIL) 10 MG tablet Take 10 mg by mouth daily.  09/14/13   [provider]  oxyCODONE-acetaminophen (PERCOCET) 5-325 MG tablet Take 1-2 tablets by mouth every 8 (eight) hours as needed for severe pain. 10/17/19   Leandrew Koyanagi, MD  prochlorperazine (COMPAZINE) 10 MG tablet Take 1 tablet (10 mg total) by mouth every 6 (six) hours as needed for nausea or vomiting. 01/19/18   [name], MD  Tiotropium Bromide Monohydrate (SPIRIVA RESPIMAT) 2.5 MCG/ACT AERS Inhale 2 puffs into the lungs daily. 12/15/18   Collene Gobble, MD  XTANDI 40 MG capsule Take 4 capsules (160 mg total) by mouth daily. 10/02/19   [name], MD    Family History Family History  Problem Relation Age of Onset  . Stroke Mother   . Cancer Father        esophageal    Social History Social History   Tobacco Use  . Smoking status: Former Smoker    Packs/day: 1.50    Years: 50.00  Pack years: 75.00    Types: Cigarettes    Quit date: 08/20/2011    Years since quitting: 8.1  . Smokeless tobacco: Never Used  Substance Use Topics  . Alcohol use: Yes    Comment: 1beer a day  . Drug use: No     Allergies   Patient has no known allergies.   Review of Systems Review of Systems  Constitutional:       Per HPI, otherwise negative  HENT:       Per HPI, otherwise negative  Respiratory:       Per HPI, otherwise negative  Cardiovascular:       Per HPI, otherwise negative  Gastrointestinal: Positive  for nausea. Negative for vomiting.  Endocrine:       Negative aside from HPI  Genitourinary:       Neg aside from HPI   Musculoskeletal:       Per HPI, otherwise negative  Skin: Negative.   Allergic/Immunologic: Positive for immunocompromised state.  Neurological: Positive for weakness. Negative for syncope.     Physical Exam Updated Vital Signs BP 128/77 (BP Location: Left Arm)   Pulse (!) 102   Temp 97.7 F (36.5 C) (Oral)   Resp 17   Ht 5\' 8"  (1.727 m)   Wt 71.2 kg   SpO2 95%   BMI 23.87 kg/m   Physical Exam Vitals signs and nursing note reviewed.  Constitutional:      Appearance: He is well-developed. He is ill-appearing and diaphoretic.     Comments: Uncomfortable appearing adult male awake and alert, on a long spine board.  He has hesitancy to transfer secondary to ongoing pain.  HENT:     Head: Normocephalic and atraumatic.  Eyes:     Conjunctiva/sclera: Conjunctivae normal.  Cardiovascular:     Rate and Rhythm: Normal rate and regular rhythm.  Pulmonary:     Effort: Pulmonary effort is normal. No respiratory distress.     Breath sounds: No stridor.  Abdominal:     General: There is no distension.  Musculoskeletal:     Comments: Patient unwilling to move his lower extremity secondary to pain in the left hip.  No gross deformities, and the patient can move both distal extremities appropriately, wiggling all toes, has appropriate cap refill, and palpable pulses distally bilaterally.  Skin:    General: Skin is warm.  Neurological:     Mental Status: He is alert and oriented to person, place, and time.  Psychiatric:        Mood and Affect: Mood is anxious.      ED Treatments / Results  Labs (all labs ordered are listed, but only abnormal results are displayed) Labs Reviewed  BASIC METABOLIC PANEL - Abnormal; Notable for the following components:      Result Value   Sodium 127 (*)    Potassium 5.4 (*)    Chloride 97 (*)    Glucose, Bld 108 (*)    BUN  29 (*)    All other components within normal limits  CBC WITH DIFFERENTIAL/PLATELET - Abnormal; Notable for the following components:   WBC 18.4 (*)    RBC 3.36 (*)    Hemoglobin 11.3 (*)    HCT 34.4 (*)    MCV 102.4 (*)    Platelets 425 (*)    Neutro Abs 15.9 (*)    Monocytes Absolute 1.1 (*)    Abs Immature Granulocytes 0.09 (*)    All other components within normal limits  EKG None  Radiology No results found.  Procedures Procedures (including critical care time)  Medications Ordered in ED Medications  morphine 4 MG/ML injection 8 mg (8 mg Intravenous Given 10/19/19 1338)  0.9 %  sodium chloride infusion (has no administration in time range)  HYDROmorphone (DILAUDID) injection 1 mg (1 mg Intravenous Given 10/19/19 1233)  morphine 4 MG/ML injection 8 mg (8 mg Intravenous Given 10/19/19 1257)     Initial Impression / Assessment and Plan / ED Course  I have reviewed the triage vital signs and the nursing notes.  Pertinent labs & imaging results that were available during my care of the patient were reviewed by me and considered in my medical decision making (see chart for details).    I reviewed the patient's chart, including MRI from 3 days ago , And prior oncologic studies, notable for prostate cancer with metastases. Patient's right hip has evidence for complex fracture, likely pathologic.  MRI from 3 days ago: IMPRESSION: 1. Multi lobular extensive enhancing lytic lesions throughout the right femoral head, neck, greater and lesser trochanters extending into the proximal femoral shaft, consistent with the patient's known metastatic disease. There are multiple air fluid fluid levels with areas of cortical breakthrough seen along the greater trochanter. 2. Diffuse marrow edema within the adjacent femoral head and proximal femoral shaft. No definite fracture however seen. 3. Small enhancing cystic lesion in the superior acetabulum which likely also represent a  focus of metastatic disease 4. Moderate bilateral hip osteoarthritis.  2:15 PM Patient continues to be in pain in spite of initial Dilaudid, morphine x2. Clinically appears more comfortable, however. Labs notable for leukocytosis, and evidence for dehydration with hyponatremia, hyperkalemia.  He is hemodynamically unremarkable however.  I discussed the patient's case with his orthopedic team, given his planned upcoming surgery, and he has known history of metastatic cancer with this new evidence for pathologic fracture. Subsequently discussed patient's case with our internal medicine colleagues for admission for further monitoring, management, fluid resuscitation, pain management with anticipation of surgery. A coronavirus test was sent yesterday is not yet available.   Final Clinical Impressions(s) / ED Diagnoses   Final diagnoses:  Hip pain, right  Hyponatremia  Hyperkalemia     Carmin Muskrat, MD 10/19/19 1418

## 2019-10-20 ENCOUNTER — Inpatient Hospital Stay (HOSPITAL_COMMUNITY): Payer: Medicare Other

## 2019-10-20 LAB — CBC
HCT: 34.8 % — ABNORMAL LOW (ref 39.0–52.0)
Hemoglobin: 11.7 g/dL — ABNORMAL LOW (ref 13.0–17.0)
MCH: 34.2 pg — ABNORMAL HIGH (ref 26.0–34.0)
MCHC: 33.6 g/dL (ref 30.0–36.0)
MCV: 101.8 fL — ABNORMAL HIGH (ref 80.0–100.0)
Platelets: 430 10*3/uL — ABNORMAL HIGH (ref 150–400)
RBC: 3.42 MIL/uL — ABNORMAL LOW (ref 4.22–5.81)
RDW: 12.9 % (ref 11.5–15.5)
WBC: 14.1 10*3/uL — ABNORMAL HIGH (ref 4.0–10.5)
nRBC: 0 % (ref 0.0–0.2)

## 2019-10-20 MED ORDER — MORPHINE SULFATE (PF) 4 MG/ML IV SOLN: 4.0000 mg | INTRAVENOUS | Status: DC | PRN

## 2019-10-20 MED ORDER — LORAZEPAM 2 MG/ML IJ SOLN: 0.5000 mg | INTRAMUSCULAR | Status: DC | PRN

## 2019-10-20 NOTE — Progress Notes (Signed)
Dr. Vista Lawman notified by secure message that patient is feeling 'tight as a tick' and experiencing abdominal pain.  Awaiting further orders.

## 2019-10-20 NOTE — ED Notes (Signed)
Scanner in patients room is not working. Pain medications given prior to [facility] transfer

## 2019-10-20 NOTE — Plan of Care (Signed)

## 2019-10-20 NOTE — ED Notes (Signed)
Pt resting comfortably and is asleep.

## 2019-10-20 NOTE — ED Notes (Signed)
Pt asleep, seems to be resting comfortably will continue to monitor

## 2019-10-20 NOTE — Progress Notes (Signed)
[facility]                                                                              Patient Demographics  Anthony Escobar, is a 68 y.o. male, DOB - [birth year], EGB:[id number]  Admit date - 10/19/2019   Admitting Physician Georgette Shell, MD  Outpatient Primary MD for the patient is [name], MD  Outpatient specialists:   LOS - 1  days    Chief Complaint  Patient presents with  . Hip Pain       Brief summary  Anthony Escobar is a 68 y.o. 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  Assessment & Plan    Principal Problem:   Pathologic intertrochanteric fracture, right, initial encounter [facility]) Active Problems:   Hyperkalemia   Hyponatremia    Pathological fracture of the right intertrochanteric hip fracture  History of metastatic prostate cancer -was planned for elective hip surgery on 10/22/2019 but came to the hospital due to worsening pain.  Orthopedist consulted on admission and plans surgery for Monday, 10/22/2019. Pain control Check twelve-lead EKG Lovenox on Sunday morning 10/21/2019 for DVT prophylaxis-no Lovenox after 12 noon on Sunday Patient on narcotics with benzo-watch carefully for overdose -cut back on the dosing  Hyponatremic- cont saline IVF  Mild hyponatremia-repeat and follow  Leukocytosis-improving, urinalysis negative, no evidence of infectious focus, follow clinically   Code Status: Full code DVT Prophylaxis:  Lovenox  Family Communication:   Disposition Plan: To be determined  Time Spent in minutes 35 minutes  Procedures:    Consultants:   Dr Erlinda Hong, ortho  Antimicrobials:      Medications  Scheduled Meds: . amitriptyline  75 mg  Oral QHS  . atorvastatin  20 mg Oral Daily  . bisacodyl  10 mg Oral Daily  . docusate sodium  200 mg Oral Daily  . enoxaparin (LOVENOX) injection  40 mg Subcutaneous Q24H  . enzalutamide  160 mg Oral Daily  . mometasone-formoterol  2 puff Inhalation BID   Continuous Infusions: . sodium chloride 125 mL/hr at 10/20/19 0557   PRN Meds:.albuterol, HYDROmorphone (DILAUDID) injection, LORazepam, morphine injection, oxyCODONE-acetaminophen   Antibiotics   Anti-infectives (From admission, onward)   None        Subjective:   Anthony Escobar was seen and examined today. Patient denies dizziness, chest pain, shortness of breath, abdominal pain.   Objective:   Vitals:   10/20/19 0247 10/20/19 0400 10/20/19 0646 10/20/19 0731  BP: 104/75 103/67  106/84  Pulse: 99 (!) 101 100 (!) 101  Resp: (!) 8   14  Temp:    98.2 F (36.8 C)  TempSrc:    Oral  SpO2: 94% (!) 80%  97%  Weight:      Height:        Intake/Output Summary (Last 24 hours) at 10/20/2019 1132 Last data filed at 10/20/2019 0900 Gross per 24 hour  Intake 240 ml  Output -  Net 240 ml     Wt Readings from Last 3 Encounters:  10/19/19 71.2 kg  10/17/19 71.2 kg  10/16/19 71.6 kg     Exam  General: NAD  HEENT: NCAT,  PERRL,MMM  Neck: SUPPLE, (-) JVD  Cardiovascular: RRR, (-) GALLOP, (-) MURMUR  Respiratory: Few occasional rales otherwise CTA  Gastrointestinal: SOFT, (-) DISTENSION, BS(+), (_) TENDERNESS  Ext: (-) CYANOSIS, (-) EDEMA  Neuro: A, OX 3  Skin:(-) RASH  Psych:NORMAL AFFECT/MOOD   Data Reviewed:  I have personally reviewed following labs and imaging studies  Micro Results Recent Results (from the past 240 hour(s))  Novel Coronavirus, NAA (Hosp order, Send-out to [facility]; TAT 18-24 hrs     Status: None   Collection Time: 10/18/19 11:12 AM   Specimen: Nasopharyngeal Swab; Respiratory  Result Value Ref Range Status   SARS-CoV-2, NAA NOT DETECTED NOT DETECTED Final    Comment:  (NOTE) This nucleic acid amplification test was developed and its performance characteristics determined by [employer]. Nucleic acid amplification tests include PCR and TMA. This test has not been FDA cleared or approved. This test has been authorized by FDA under an Emergency Use Authorization (EUA). This test is only authorized for the duration of time the declaration that circumstances exist justifying the authorization of the emergency use of in vitro diagnostic tests for detection of SARS-CoV-2 virus and/or diagnosis of COVID-19 infection under section 564(b)(1) of the Act, 21 U.S.C. 956OZH-0(Q) (1), unless the authorization is terminated or revoked sooner. When diagnostic testing is negative, the possibility of a false negative result should be considered in the context of a patient's recent exposures and the presence of clinical signs and symptoms consistent with COVID-19. An individual without symptoms of COVID- 19 and who is not shedding SARS-CoV-2 vi rus would expect to have a negative (not detected) result in this assay. Performed At: [facility] [address] [place], [place] [id number] Rush Farmer MD XB:[id number]    [place]  Final    Comment: Performed at [facility], [place] [address] [place], [place] [address]    Radiology Reports Dg Chest 1 View  Result Date: 10/19/2019 CLINICAL DATA:  Leukocytosis. EXAM: CHEST  1 VIEW COMPARISON:  October 18, 2017. FINDINGS: The heart size and mediastinal contours are within normal limits. Pulmonary nodules noted on prior radiograph are not well visualized currently. No consolidative process is noted. The visualized skeletal structures are unremarkable. IMPRESSION: No active disease. Electronically Signed   By: Marijo Conception M.D.   On: 10/19/2019 15:54   Mr Hip Right W Wo Contrast  Result Date: 10/16/2019 CLINICAL DATA:  Prostate cancer, continued hip pain EXAM: MRI OF THE  RIGHT HIP WITHOUT AND WITH CONTRAST TECHNIQUE: Multiplanar, multisequence MR imaging was performed both before and after administration of intravenous contrast. CONTRAST:  7.36mL GADAVIST GADOBUTROL 1 MMOL/ML IV SOLN COMPARISON:  CT February 28, 2019 FINDINGS: Bones: There is extensive T1 heterogeneous L/T2 bright multilocular cystic lytic lesion seen throughout the right femoral head, neck, greater and lesser trochanters as well as the proximal femur. Multiple fluid fluid levels are seen, likely blood products layering within the cystic lesions. There does appear to be areas of enhancement within the lesions. Cortical breakthrough seen along the posterior greater trochanter and along the lateral surface of the greater trochanter. There is enhancement involving the synovium. There is a small femoroacetabular joint effusion. No definite fracture seen. There is increased marrow  signal seen within the proximal femur, likely marrow edema. There does appear to be a small subchondral cystic lesion in the superior right acetabulum which does show enhancement, and could represent a focus of disease. No other osseous lesions are identified. There is moderate bilateral hip osteoarthritis with superior joint space loss and small subchondral cystic changes. The visualized bony pelvis appears normal. The visualized sacroiliac joints and symphysis pubis appear normal. Articular cartilage and labrum Articular cartilage: There is diffuse chondral thinning seen within the bilateral femoroacetabular joints. Labrum: There is no gross labral tear or paralabral abnormality. Joint or bursal effusion Joint effusion: A small joint effusion is seen at the right femoroacetabular joint with diffuse surrounding synovial enhancement. Bursae: No focal periarticular fluid collection. Muscles and tendons Muscles and tendons: There is increased signal with enhancement seen within the gluteal musculature and at the iliopsoas insertion site. The there is  increased intrasubstance signal seen at the iliopsoas, gluteal, and hamstrings insertion site. Other findings Miscellaneous: There is a heterogeneous appearing prostate gland. Scattered colonic diverticula are noted. The remainder of the deep pelvis is grossly unremarkable. IMPRESSION: 1. Multi lobular extensive enhancing lytic lesions throughout the right femoral head, neck, greater and lesser trochanters extending into the proximal femoral shaft, consistent with the patient's known metastatic disease. There are multiple air fluid fluid levels with areas of cortical breakthrough seen along the greater trochanter. 2. Diffuse marrow edema within the adjacent femoral head and proximal femoral shaft. No definite fracture however seen. 3. Small enhancing cystic lesion in the superior acetabulum which likely also represent a focus of metastatic disease 4. Moderate bilateral hip osteoarthritis. Electronically Signed   By: Prudencio Pair M.D.   On: 10/16/2019 19:23   Dg Hip Unilat With Pelvis 1v Right  Result Date: 10/19/2019 CLINICAL DATA:  Right hip pain EXAM: DG HIP (WITH OR WITHOUT PELVIS) 1V RIGHT COMPARISON:  MRI 10/16/2019, CT 02/28/2019 FINDINGS: Mixed lytic and sclerotic lesion involving the right femoral head, neck, trochanter and proximal shaft. Acute slightly impacted appearing fracture involving the base of the femoral neck. No femoral head dislocation. Left hip demonstrates normal alignment. IMPRESSION: Mixed lytic and sclerotic lesion involving the proximal right femur, with evidence of acute mildly impacted fracture involving the base of the femoral neck, consistent with pathologic fracture. Electronically Signed   By: Donavan Foil M.D.   On: 10/19/2019 15:22    Lab Data:  CBC: Recent Labs  Lab 10/16/19 0824 10/19/19 1300 10/20/19 0924  WBC 9.1 18.4* 14.1*  NEUTROABS  --  15.9*  --   HGB 13.1 11.3* 11.7*  HCT 39.0 34.4* 34.8*  MCV 100.0 102.4* 101.8*  PLT 533* 425* 430*   Basic  Metabolic Panel: Recent Labs  Lab 10/16/19 0824 10/19/19 1300  NA 136 127*  K 4.3 5.4*  CL 101 97*  CO2 23 22  GLUCOSE 112* 108*  BUN 23 29*  CREATININE 1.20 1.08  CALCIUM 9.9 9.1   GFR: Estimated Creatinine Clearance: 63.3 mL/min (by C-G formula based on SCr of 1.08 mg/dL). Liver Function Tests: Recent Labs  Lab 10/16/19 0824  AST 21  ALT 9  ALKPHOS 114  BILITOT 0.2*  PROT 7.5  ALBUMIN 3.9   No results for input(s): LIPASE, AMYLASE in the last 168 hours. No results for input(s): AMMONIA in the last 168 hours. Coagulation Profile: No results for input(s): INR, PROTIME in the last 168 hours. Cardiac Enzymes: No results for input(s): CKTOTAL, CKMB, CKMBINDEX, TROPONINI in the last 168 hours. BNP (  last 3 results) No results for input(s): PROBNP in the last 8760 hours. HbA1C: No results for input(s): HGBA1C in the last 72 hours. CBG: No results for input(s): GLUCAP in the last 168 hours. Lipid Profile: No results for input(s): CHOL, HDL, LDLCALC, TRIG, CHOLHDL, LDLDIRECT in the last 72 hours. Thyroid Function Tests: No results for input(s): TSH, T4TOTAL, FREET4, T3FREE, THYROIDAB in the last 72 hours. Anemia Panel: No results for input(s): VITAMINB12, FOLATE, FERRITIN, TIBC, IRON, RETICCTPCT in the last 72 hours. Urine analysis:    Component Value Date/Time   COLORURINE YELLOW 10/19/2019 1609   APPEARANCEUR CLEAR 10/19/2019 1609   LABSPEC 1.011 10/19/2019 1609   PHURINE 5.0 10/19/2019 1609   GLUCOSEU NEGATIVE 10/19/2019 1609   HGBUR NEGATIVE 10/19/2019 1609   BILIRUBINUR NEGATIVE 10/19/2019 1609   KETONESUR NEGATIVE 10/19/2019 1609   PROTEINUR NEGATIVE 10/19/2019 1609   NITRITE NEGATIVE 10/19/2019 1609   LEUKOCYTESUR NEGATIVE 10/19/2019 1609     Benito Mccreedy M.D. [facility] 10/20/2019, 11:32 AM  Pager: [phone] Between 7am to 7pm - call Pager - [phone]  After 7pm go to [email or web address] - password [email or web address]  Call night coverage person  covering after 7pm

## 2019-10-20 NOTE — Progress Notes (Signed)
Patient's son notified to bring chemo medication from home.

## 2019-10-20 NOTE — ED Notes (Signed)
[facility] here for patient transport

## 2019-10-20 NOTE — Progress Notes (Signed)
Secure message sent to Dr. Vista Lawman regarding pain medication.

## 2019-10-20 NOTE — ED Notes (Signed)
Pt spo2 90%, placed on 2L 02 via nasal canula

## 2019-10-20 NOTE — ED Notes (Signed)
Vitals stable bp 103/70, spo2 94, rr 7, Hr 98. Pt is currently asleep

## 2019-10-20 NOTE — Progress Notes (Signed)
Anthony Escobar received from patient, taken to pharmacy.  White form signed and in chart in drawer, upon discharge RN will need to take white form to pharmacy to have medication released back to patient.

## 2019-10-21 LAB — SURGICAL PCR SCREEN
MRSA, PCR: NEGATIVE
Staphylococcus aureus: NEGATIVE

## 2019-10-21 LAB — CBC
HCT: 41.3 % (ref 39.0–52.0)
Hemoglobin: 13.9 g/dL (ref 13.0–17.0)
MCH: 33.9 pg (ref 26.0–34.0)
MCHC: 33.7 g/dL (ref 30.0–36.0)
MCV: 100.7 fL — ABNORMAL HIGH (ref 80.0–100.0)
Platelets: 391 10*3/uL (ref 150–400)
RBC: 4.1 MIL/uL — ABNORMAL LOW (ref 4.22–5.81)
RDW: 12.4 % (ref 11.5–15.5)
WBC: 14.9 10*3/uL — ABNORMAL HIGH (ref 4.0–10.5)
nRBC: 0 % (ref 0.0–0.2)

## 2019-10-21 LAB — COMPREHENSIVE METABOLIC PANEL
ALT: 13 U/L (ref 0–44)
AST: 43 U/L — ABNORMAL HIGH (ref 15–41)
Albumin: 3 g/dL — ABNORMAL LOW (ref 3.5–5.0)
Alkaline Phosphatase: 197 U/L — ABNORMAL HIGH (ref 38–126)
Anion gap: 13 (ref 5–15)
BUN: 23 mg/dL (ref 8–23)
CO2: 21 mmol/L — ABNORMAL LOW (ref 22–32)
Calcium: 9.4 mg/dL (ref 8.9–10.3)
Chloride: 102 mmol/L (ref 98–111)
Creatinine, Ser: 1.14 mg/dL (ref 0.61–1.24)
GFR calc Af Amer: >60 mL/min (ref >60)
GFR calc non Af Amer: >60 mL/min (ref >60)
Glucose, Bld: 102 mg/dL — ABNORMAL HIGH (ref 70–99)
Potassium: 4.4 mmol/L (ref 3.5–5.1)
Sodium: 136 mmol/L (ref 135–145)
Total Bilirubin: 0.7 mg/dL (ref 0.3–1.2)
Total Protein: 6.4 g/dL — ABNORMAL LOW (ref 6.5–8.1)

## 2019-10-21 MED ORDER — MINERAL OIL RE ENEM
1.0000 | ENEMA | Freq: Once | RECTAL | Status: AC
  Administered 2019-10-21: 1 via RECTAL
  Filled 2019-10-21: qty 1

## 2019-10-21 MED ORDER — POLYETHYLENE GLYCOL 3350 17 G PO PACK
17.0000 g | PACK | Freq: Every day | ORAL | Status: DC
  Administered 2019-10-21 – 2019-10-23 (×2): 17 g via ORAL
  Filled 2019-10-21 (×4): qty 1

## 2019-10-21 MED ORDER — ONDANSETRON HCL 4 MG/2ML IJ SOLN
4.0000 mg | Freq: Four times a day (QID) | INTRAMUSCULAR | Status: DC | PRN
  Administered 2019-10-21: 4 mg via INTRAVENOUS
  Filled 2019-10-21: qty 2

## 2019-10-21 MED ORDER — POLYETHYLENE GLYCOL (MIRALAX) NICU SYRINGE 0.34GM/ML: 1.0000 g/kg | ORAL | Status: DC

## 2019-10-21 MED ORDER — CEFAZOLIN SODIUM-DEXTROSE 2-4 GM/100ML-% IV SOLN
2.0000 g | Freq: Once | INTRAVENOUS | Status: AC
  Administered 2019-10-22: 2 g via INTRAVENOUS
  Filled 2019-10-21 (×2): qty 100

## 2019-10-21 MED ORDER — NALOXEGOL OXALATE 12.5 MG PO TABS
12.5000 mg | ORAL_TABLET | Freq: Every day | ORAL | Status: DC
  Administered 2019-10-21 – 2019-10-23 (×3): 12.5 mg via ORAL
  Filled 2019-10-21 (×5): qty 1

## 2019-10-21 MED ORDER — FAMOTIDINE 20 MG PO TABS
20.0000 mg | ORAL_TABLET | Freq: Once | ORAL | Status: AC
  Administered 2019-10-21: 20 mg via ORAL
  Filled 2019-10-21: qty 1

## 2019-10-21 NOTE — Progress Notes (Signed)
RN notified [facility] of patient request for acid reflux medication.

## 2019-10-21 NOTE — Progress Notes (Signed)
Pulse 114.   MEWS yellow.   RN notified [facility] per protocol.

## 2019-10-21 NOTE — Plan of Care (Signed)
  Problem: Pain Managment: Goal: General experience of comfort will improve Outcome: Progressing   Problem: Safety: Goal: Ability to remain free from injury will improve Outcome: Progressing   

## 2019-10-21 NOTE — Plan of Care (Signed)

## 2019-10-21 NOTE — Anesthesia Preprocedure Evaluation (Addendum)
Anesthesia Evaluation  Patient identified by MRN, date of birth, ID band Patient awake    Reviewed: Allergy & Precautions, NPO status , Patient's Chart, lab work & pertinent test results  Airway Mallampati: III  TM Distance: >3 FB Neck ROM: Full    Dental no notable dental hx.    Pulmonary COPD, former smoker,  Quit smoking 2012, 75 pack year history  COPD inhalers- albuterol, spiriva, symbicort   Pulmonary exam normal breath sounds clear to auscultation       Cardiovascular hypertension, negative cardio ROS Normal cardiovascular exam Rhythm:Regular Rate:Normal     Neuro/Psych PSYCHIATRIC DISORDERS Anxiety negative neurological ROS     GI/Hepatic negative GI ROS, Neg liver ROS,   Endo/Other  negative endocrine ROS  Renal/GU negative Renal ROS   prostate cancer with mets to the bone diagnosed in 2019, status post 6 cycles of Taxotere chemotherapy May 2019 followed by Lupron and [facility]    Musculoskeletal Pathologic right hip fx 2/2 bone mets, was planned for hip surgery but p/w increasing hip pain   Abdominal Normal abdominal exam  (+)   Peds negative pediatric ROS (+)  Hematology negative hematology ROS (+)   Anesthesia Other Findings HLD  Chronic pain- taking percocet, naproxen  Reproductive/Obstetrics negative OB ROS                            Anesthesia Physical Anesthesia Plan  ASA: III  Anesthesia Plan: General   Post-op Pain Management:    Induction: Intravenous  PONV Risk Score and Plan: 1 and Ondansetron, Midazolam and Treatment may vary due to age or medical condition  Airway Management Planned: Oral ETT  Additional Equipment: None  Intra-op Plan:   Post-operative Plan: Extubation in OR  Informed Consent: I have reviewed the patients History and Physical, chart, labs and discussed the procedure including the risks, benefits and alternatives for the proposed  anesthesia with the patient or authorized representative who has indicated his/her understanding and acceptance.       Plan Discussed with: CRNA  Anesthesia Plan Comments: (Last dose lovenox:   plt 391)       Anesthesia Quick Evaluation

## 2019-10-21 NOTE — Progress Notes (Signed)
[facility]                                                                              Patient Demographics  Anthony Escobar, is a 68 y.o. male, DOB - 10-Jun-1951, OJJ:[id number]  Admit date - 10/19/2019   Admitting Physician Georgette Shell, MD  Outpatient Primary MD for the patient is [name], MD  Outpatient specialists:   LOS - 2  days    Chief Complaint  Patient presents with  . Hip Pain       Brief summary  Anthony Escobar is a 68 y.o. 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  Assessment & Plan    Principal Problem:   Pathologic intertrochanteric fracture, right, initial encounter [facility]) Active Problems:   Hyperkalemia   Hyponatremia    Pathological fracture of the right intertrochanteric hip fracture  History of metastatic prostate cancer -was planned for elective hip surgery on 10/22/2019 but came to the hospital due to worsening pain.  Orthopedist consulted on admission and plans surgery for Monday, 10/22/2019. Pain control Check twelve-lead EKG Lovenox on Sunday morning 10/21/2019 for DVT prophylaxis-no Lovenox after 12 noon on Sunday Patient on narcotics with benzo-watch carefully for overdose -cut back on the dosing    Constipation with Nausea Failed laxative(s) -trial of movantik KUB 12/20/19 -P OOB to Chair as tol    Hyponatremic- resolved with  saline IVF  Mild hyponatremia-repeat and follow  Leukocytosis-improving, urinalysis negative, no evidence of infectious focus, follow clinically   Code Status: Full code DVT Prophylaxis:  Lovenox  Family Communication:   Disposition Plan: To be determined  Time Spent in minutes 25  minutes  Procedures:    Consultants:   Dr Erlinda Hong, ortho  Antimicrobials:      Medications  Scheduled Meds: . amitriptyline  75 mg Oral QHS  . atorvastatin  20 mg Oral Daily  . bisacodyl  10 mg Oral Daily  . docusate sodium  200 mg Oral Daily  . enoxaparin (LOVENOX) injection  40 mg Subcutaneous Q24H  . enzalutamide  160 mg Oral Daily  . mometasone-formoterol  2 puff Inhalation BID   Continuous Infusions: . sodium chloride 125 mL/hr at 10/21/19 0651   PRN Meds:.albuterol, HYDROmorphone (DILAUDID) injection, LORazepam, morphine injection, ondansetron (ZOFRAN) IV, oxyCODONE-acetaminophen   Antibiotics   Anti-infectives (From admission, onward)   None        Subjective:   Anthony Escobar was seen and examined today. C/o nausea with an episode of vomiting, minimal abd discomfory - he notes constipation  Objective:   Vitals:   10/21/19 0402 10/21/19 0437 10/21/19 0827 10/21/19 0900  BP: (!) 157/96  (!) 147/86   Pulse: (!) 114 (!) 102 (!) 115 100  Resp: 17     Temp: 98.6 F (37 C)  98.8 F (37.1 C)   TempSrc: Oral  Oral   SpO2: 95%  95%  Weight:      Height:        Intake/Output Summary (Last 24 hours) at 10/21/2019 1005 Last data filed at 10/21/2019 0500 Gross per 24 hour  Intake 4264.01 ml  Output 1200 ml  Net 3064.01 ml     Wt Readings from Last 3 Encounters:  10/19/19 71.2 kg  10/17/19 71.2 kg  10/16/19 71.6 kg     Exam  General: NAD  HEENT: NCAT,  PERRL,MMM  Neck: SUPPLE, (-) JVD  Cardiovascular: RRR, (-) GALLOP, (-) MURMUR  Respiratory: Few occasional rales otherwise CTA  Gastrointestinal: SOFT, (-) DISTENSION, BS(+), (_) TENDERNESS  Ext: (-) CYANOSIS, (-) EDEMA  Neuro: A, OX 3  Skin:(-) RASH  Psych:NORMAL AFFECT/MOOD   Data Reviewed:  I have personally reviewed following labs and imaging studies  Micro Results Recent Results (from the past 240 hour(s))  Novel Coronavirus, NAA (Hosp order, Send-out to [facility]; TAT 18-24  hrs     Status: None   Collection Time: 10/18/19 11:12 AM   Specimen: Nasopharyngeal Swab; Respiratory  Result Value Ref Range Status   SARS-CoV-2, NAA NOT DETECTED NOT DETECTED Final    Comment: (NOTE) This nucleic acid amplification test was developed and its performance characteristics determined by [employer]. Nucleic acid amplification tests include PCR and TMA. This test has not been FDA cleared or approved. This test has been authorized by FDA under an Emergency Use Authorization (EUA). This test is only authorized for the duration of time the declaration that circumstances exist justifying the authorization of the emergency use of in vitro diagnostic tests for detection of SARS-CoV-2 virus and/or diagnosis of COVID-19 infection under section 564(b)(1) of the Act, 21 U.S.C. 025ENI-7(P) (1), unless the authorization is terminated or revoked sooner. When diagnostic testing is negative, the possibility of a false negative result should be considered in the context of a patient's recent exposures and the presence of clinical signs and symptoms consistent with COVID-19. An individual without symptoms of COVID- 19 and who is not shedding SARS-CoV-2 vi rus would expect to have a negative (not detected) result in this assay. Performed At: [facility] [address] [place], [place] [id number] Rush Farmer MD WE:[id number]    [place]  Final    Comment: Performed at [facility], [place] [address] [place], [place] [address]  Surgical pcr screen     Status: None   Collection Time: 10/21/19  5:12 AM   Specimen: Nasal Mucosa; Nasal Swab  Result Value Ref Range Status   MRSA, PCR NEGATIVE NEGATIVE Final   Staphylococcus aureus NEGATIVE NEGATIVE Final    Comment: (NOTE) The Xpert SA Assay (FDA approved for NASAL specimens in patients 63 years of age and older), is one component of a comprehensive surveillance program. It is not  intended to diagnose infection nor to guide or monitor treatment. Performed at [facility], [place] [address], [place], [place] [address]     Radiology Reports Dg Chest 1 View  Result Date: 10/19/2019 CLINICAL DATA:  Leukocytosis. EXAM: CHEST  1 VIEW COMPARISON:  October 18, 2017. FINDINGS: The heart size and mediastinal contours are within normal limits. Pulmonary nodules noted on prior radiograph are not well visualized currently. No consolidative process is noted. The visualized skeletal structures are unremarkable. IMPRESSION: No active disease. Electronically Signed   By: Marijo Conception M.D.   On: 10/19/2019 15:54   Mr Hip Right W Wo Contrast  Result Date: 10/16/2019 CLINICAL DATA:  Prostate cancer,  continued hip pain EXAM: MRI OF THE RIGHT HIP WITHOUT AND WITH CONTRAST TECHNIQUE: Multiplanar, multisequence MR imaging was performed both before and after administration of intravenous contrast. CONTRAST:  7.46mL GADAVIST GADOBUTROL 1 MMOL/ML IV SOLN COMPARISON:  CT February 28, 2019 FINDINGS: Bones: There is extensive T1 heterogeneous L/T2 bright multilocular cystic lytic lesion seen throughout the right femoral head, neck, greater and lesser trochanters as well as the proximal femur. Multiple fluid fluid levels are seen, likely blood products layering within the cystic lesions. There does appear to be areas of enhancement within the lesions. Cortical breakthrough seen along the posterior greater trochanter and along the lateral surface of the greater trochanter. There is enhancement involving the synovium. There is a small femoroacetabular joint effusion. No definite fracture seen. There is increased marrow signal seen within the proximal femur, likely marrow edema. There does appear to be a small subchondral cystic lesion in the superior right acetabulum which does show enhancement, and could represent a focus of disease. No other osseous lesions are identified. There is moderate bilateral hip  osteoarthritis with superior joint space loss and small subchondral cystic changes. The visualized bony pelvis appears normal. The visualized sacroiliac joints and symphysis pubis appear normal. Articular cartilage and labrum Articular cartilage: There is diffuse chondral thinning seen within the bilateral femoroacetabular joints. Labrum: There is no gross labral tear or paralabral abnormality. Joint or bursal effusion Joint effusion: A small joint effusion is seen at the right femoroacetabular joint with diffuse surrounding synovial enhancement. Bursae: No focal periarticular fluid collection. Muscles and tendons Muscles and tendons: There is increased signal with enhancement seen within the gluteal musculature and at the iliopsoas insertion site. The there is increased intrasubstance signal seen at the iliopsoas, gluteal, and hamstrings insertion site. Other findings Miscellaneous: There is a heterogeneous appearing prostate gland. Scattered colonic diverticula are noted. The remainder of the deep pelvis is grossly unremarkable. IMPRESSION: 1. Multi lobular extensive enhancing lytic lesions throughout the right femoral head, neck, greater and lesser trochanters extending into the proximal femoral shaft, consistent with the patient's known metastatic disease. There are multiple air fluid fluid levels with areas of cortical breakthrough seen along the greater trochanter. 2. Diffuse marrow edema within the adjacent femoral head and proximal femoral shaft. No definite fracture however seen. 3. Small enhancing cystic lesion in the superior acetabulum which likely also represent a focus of metastatic disease 4. Moderate bilateral hip osteoarthritis. Electronically Signed   By: Prudencio Pair M.D.   On: 10/16/2019 19:23   Dg Hip Unilat With Pelvis 1v Right  Result Date: 10/19/2019 CLINICAL DATA:  Right hip pain EXAM: DG HIP (WITH OR WITHOUT PELVIS) 1V RIGHT COMPARISON:  MRI 10/16/2019, CT 02/28/2019 FINDINGS: Mixed  lytic and sclerotic lesion involving the right femoral head, neck, trochanter and proximal shaft. Acute slightly impacted appearing fracture involving the base of the femoral neck. No femoral head dislocation. Left hip demonstrates normal alignment. IMPRESSION: Mixed lytic and sclerotic lesion involving the proximal right femur, with evidence of acute mildly impacted fracture involving the base of the femoral neck, consistent with pathologic fracture. Electronically Signed   By: Donavan Foil M.D.   On: 10/19/2019 15:22    Lab Data:  CBC: Recent Labs  Lab 10/16/19 0824 10/19/19 1300 10/20/19 0924 10/21/19 0625  WBC 9.1 18.4* 14.1* 14.9*  NEUTROABS  --  15.9*  --   --   HGB 13.1 11.3* 11.7* 13.9  HCT 39.0 34.4* 34.8* 41.3  MCV 100.0 102.4* 101.8* 100.7*  PLT 533* 425* 430* 695   Basic Metabolic Panel: Recent Labs  Lab 10/16/19 0824 10/19/19 1300 10/21/19 0625  NA 136 127* 136  K 4.3 5.4* 4.4  CL 101 97* 102  CO2 23 22 21*  GLUCOSE 112* 108* 102*  BUN 23 29* 23  CREATININE 1.20 1.08 1.14  CALCIUM 9.9 9.1 9.4   GFR: Estimated Creatinine Clearance: 60 mL/min (by C-G formula based on SCr of 1.14 mg/dL). Liver Function Tests: Recent Labs  Lab 10/16/19 0824 10/21/19 0625  AST 21 43*  ALT 9 13  ALKPHOS 114 197*  BILITOT 0.2* 0.7  PROT 7.5 6.4*  ALBUMIN 3.9 3.0*   No results for input(s): LIPASE, AMYLASE in the last 168 hours. No results for input(s): AMMONIA in the last 168 hours. Coagulation Profile: No results for input(s): INR, PROTIME in the last 168 hours. Cardiac Enzymes: No results for input(s): CKTOTAL, CKMB, CKMBINDEX, TROPONINI in the last 168 hours. BNP (last 3 results) No results for input(s): PROBNP in the last 8760 hours. HbA1C: No results for input(s): HGBA1C in the last 72 hours. CBG: No results for input(s): GLUCAP in the last 168 hours. Lipid Profile: No results for input(s): CHOL, HDL, LDLCALC, TRIG, CHOLHDL, LDLDIRECT in the last 72  hours. Thyroid Function Tests: No results for input(s): TSH, T4TOTAL, FREET4, T3FREE, THYROIDAB in the last 72 hours. Anemia Panel: No results for input(s): VITAMINB12, FOLATE, FERRITIN, TIBC, IRON, RETICCTPCT in the last 72 hours. Urine analysis:    Component Value Date/Time   COLORURINE YELLOW 10/19/2019 1609   APPEARANCEUR CLEAR 10/19/2019 1609   LABSPEC 1.011 10/19/2019 1609   PHURINE 5.0 10/19/2019 1609   GLUCOSEU NEGATIVE 10/19/2019 1609   HGBUR NEGATIVE 10/19/2019 1609   BILIRUBINUR NEGATIVE 10/19/2019 1609   KETONESUR NEGATIVE 10/19/2019 1609   PROTEINUR NEGATIVE 10/19/2019 1609   NITRITE NEGATIVE 10/19/2019 1609   LEUKOCYTESUR NEGATIVE 10/19/2019 1609     Benito Mccreedy M.D. [facility] 10/21/2019, 10:05 AM  Pager: [phone] Between 7am to 7pm - call Pager - [phone]  After 7pm go to [email or web address] - password [email or web address]  Call night coverage person covering after 7pm

## 2019-10-21 NOTE — Progress Notes (Signed)
Pt has Vomiting x 1 episode. Brownish color. Zofran administered for  Nausea. Will continue to monitor.

## 2019-10-22 ENCOUNTER — Inpatient Hospital Stay (HOSPITAL_COMMUNITY): Payer: Medicare Other

## 2019-10-22 ENCOUNTER — Encounter (HOSPITAL_COMMUNITY)
Admission: EM | Disposition: A | Discharge status: Skilled Nursing Facility | Payer: Self-pay | Source: Home / Self Care | Attending: Internal Medicine

## 2019-10-22 ENCOUNTER — Encounter (HOSPITAL_COMMUNITY): Payer: Self-pay | Admitting: Certified Registered Nurse Anesthetist

## 2019-10-22 ENCOUNTER — Inpatient Hospital Stay (HOSPITAL_COMMUNITY): Payer: Medicare Other | Admitting: Anesthesiology

## 2019-10-22 ENCOUNTER — Ambulatory Visit (HOSPITAL_COMMUNITY): Admission: RE | Admit: 2019-10-22 | Payer: Medicare Other | Source: Home / Self Care | Admitting: Orthopaedic Surgery

## 2019-10-22 DIAGNOSIS — M84451A Pathological fracture, right femur, initial encounter for fracture: Principal | ICD-10-CM

## 2019-10-22 HISTORY — PX: INTRAMEDULLARY (IM) NAIL INTERTROCHANTERIC: SHX5875

## 2019-10-22 LAB — CBC
HCT: 29.6 % — ABNORMAL LOW (ref 39.0–52.0)
Hemoglobin: 10 g/dL — ABNORMAL LOW (ref 13.0–17.0)
MCH: 33.7 pg (ref 26.0–34.0)
MCHC: 33.8 g/dL (ref 30.0–36.0)
MCV: 99.7 fL (ref 80.0–100.0)
Platelets: 405 10*3/uL — ABNORMAL HIGH (ref 150–400)
RBC: 2.97 MIL/uL — ABNORMAL LOW (ref 4.22–5.81)
RDW: 12.5 % (ref 11.5–15.5)
WBC: 14.9 10*3/uL — ABNORMAL HIGH (ref 4.0–10.5)
nRBC: 0 % (ref 0.0–0.2)

## 2019-10-22 LAB — BASIC METABOLIC PANEL
Anion gap: 10 (ref 5–15)
BUN: 21 mg/dL (ref 8–23)
CO2: 22 mmol/L (ref 22–32)
Calcium: 8.5 mg/dL — ABNORMAL LOW (ref 8.9–10.3)
Chloride: 101 mmol/L (ref 98–111)
Creatinine, Ser: 0.75 mg/dL (ref 0.61–1.24)
GFR calc Af Amer: >60 mL/min (ref >60)
GFR calc non Af Amer: >60 mL/min (ref >60)
Glucose, Bld: 134 mg/dL — ABNORMAL HIGH (ref 70–99)
Potassium: 4 mmol/L (ref 3.5–5.1)
Sodium: 133 mmol/L — ABNORMAL LOW (ref 135–145)

## 2019-10-22 LAB — TYPE AND SCREEN
ABO/RH(D): A POS
Antibody Screen: NEGATIVE

## 2019-10-22 LAB — ABO/RH: ABO/RH(D): A POS

## 2019-10-22 SURGERY — FIXATION, FRACTURE, INTERTROCHANTERIC, WITH INTRAMEDULLARY ROD
Anesthesia: General | Laterality: Right

## 2019-10-22 MED ORDER — CEFAZOLIN SODIUM-DEXTROSE 2-4 GM/100ML-% IV SOLN
2.0000 g | Freq: Four times a day (QID) | INTRAVENOUS | Status: AC
  Administered 2019-10-22 – 2019-10-23 (×3): 2 g via INTRAVENOUS
  Filled 2019-10-22 (×3): qty 100

## 2019-10-22 MED ORDER — ALBUTEROL SULFATE (2.5 MG/3ML) 0.083% IN NEBU
INHALATION_SOLUTION | RESPIRATORY_TRACT | Status: AC
  Filled 2019-10-22: qty 3

## 2019-10-22 MED ORDER — MAGNESIUM CITRATE PO SOLN: 1.0000 | Freq: Once | ORAL | Status: DC | PRN

## 2019-10-22 MED ORDER — SODIUM CHLORIDE 0.9 % IV SOLN
INTRAVENOUS | Status: DC
  Administered 2019-10-22: 19:00:00 via INTRAVENOUS

## 2019-10-22 MED ORDER — MIDAZOLAM HCL 2 MG/2ML IJ SOLN
INTRAMUSCULAR | Status: AC
  Filled 2019-10-22: qty 2

## 2019-10-22 MED ORDER — ONDANSETRON HCL 4 MG/2ML IJ SOLN
INTRAMUSCULAR | Status: DC | PRN
  Administered 2019-10-22: 4 mg via INTRAVENOUS

## 2019-10-22 MED ORDER — ALBUMIN HUMAN 5 % IV SOLN
INTRAVENOUS | Status: DC | PRN
  Administered 2019-10-22: 15:00:00 via INTRAVENOUS

## 2019-10-22 MED ORDER — ROCURONIUM BROMIDE 10 MG/ML (PF) SYRINGE
PREFILLED_SYRINGE | INTRAVENOUS | Status: AC
  Filled 2019-10-22: qty 10

## 2019-10-22 MED ORDER — FENTANYL CITRATE (PF) 100 MCG/2ML IJ SOLN: 25.0000 ug | INTRAMUSCULAR | Status: DC | PRN

## 2019-10-22 MED ORDER — METHOCARBAMOL 1000 MG/10ML IJ SOLN
500.0000 mg | Freq: Four times a day (QID) | INTRAVENOUS | Status: DC | PRN
  Filled 2019-10-22: qty 5

## 2019-10-22 MED ORDER — SUGAMMADEX SODIUM 200 MG/2ML IV SOLN
INTRAVENOUS | Status: DC | PRN
  Administered 2019-10-22: 300 mg via INTRAVENOUS

## 2019-10-22 MED ORDER — PHENOL 1.4 % MT LIQD
1.0000 | OROMUCOSAL | Status: DC | PRN
  Administered 2019-10-24: 1 via OROMUCOSAL
  Filled 2019-10-22: qty 177

## 2019-10-22 MED ORDER — DOCUSATE SODIUM 100 MG PO CAPS
100.0000 mg | ORAL_CAPSULE | Freq: Two times a day (BID) | ORAL | Status: DC
  Administered 2019-10-22 – 2019-10-23 (×3): 100 mg via ORAL
  Filled 2019-10-22 (×4): qty 1

## 2019-10-22 MED ORDER — ROCURONIUM BROMIDE 100 MG/10ML IV SOLN
INTRAVENOUS | Status: DC | PRN
  Administered 2019-10-22: 50 mg via INTRAVENOUS
  Administered 2019-10-22: 20 mg via INTRAVENOUS

## 2019-10-22 MED ORDER — MORPHINE SULFATE (PF) 2 MG/ML IV SOLN
1.0000 mg | INTRAVENOUS | Status: DC | PRN
  Administered 2019-10-24 (×3): 1 mg via INTRAVENOUS
  Filled 2019-10-22 (×4): qty 1

## 2019-10-22 MED ORDER — FENTANYL CITRATE (PF) 100 MCG/2ML IJ SOLN
INTRAMUSCULAR | Status: DC | PRN
  Administered 2019-10-22: 25 ug via INTRAVENOUS
  Administered 2019-10-22: 100 ug via INTRAVENOUS
  Administered 2019-10-22: 25 ug via INTRAVENOUS
  Administered 2019-10-22: 100 ug via INTRAVENOUS
  Administered 2019-10-22: 50 ug via INTRAVENOUS
  Administered 2019-10-22: 100 ug via INTRAVENOUS

## 2019-10-22 MED ORDER — FENTANYL CITRATE (PF) 250 MCG/5ML IJ SOLN
INTRAMUSCULAR | Status: AC
  Filled 2019-10-22: qty 5

## 2019-10-22 MED ORDER — SORBITOL 70 % SOLN
30.0000 mL | Freq: Every day | Status: DC | PRN
  Administered 2019-10-23: 30 mL via ORAL
  Filled 2019-10-22: qty 30

## 2019-10-22 MED ORDER — LIDOCAINE HCL (CARDIAC) PF 100 MG/5ML IV SOSY
PREFILLED_SYRINGE | INTRAVENOUS | Status: DC | PRN
  Administered 2019-10-22: 60 mg via INTRAVENOUS

## 2019-10-22 MED ORDER — PHENYLEPHRINE 40 MCG/ML (10ML) SYRINGE FOR IV PUSH (FOR BLOOD PRESSURE SUPPORT)
PREFILLED_SYRINGE | INTRAVENOUS | Status: AC
  Filled 2019-10-22: qty 20

## 2019-10-22 MED ORDER — MENTHOL 3 MG MT LOZG: 1.0000 | LOZENGE | OROMUCOSAL | Status: DC | PRN

## 2019-10-22 MED ORDER — ONDANSETRON HCL 4 MG PO TABS: 4.0000 mg | ORAL_TABLET | Freq: Four times a day (QID) | ORAL | Status: DC | PRN

## 2019-10-22 MED ORDER — ONDANSETRON HCL 4 MG/2ML IJ SOLN
4.0000 mg | Freq: Four times a day (QID) | INTRAMUSCULAR | Status: DC | PRN
  Administered 2019-10-24 (×2): 4 mg via INTRAVENOUS
  Filled 2019-10-22 (×2): qty 2

## 2019-10-22 MED ORDER — ALBUTEROL SULFATE (2.5 MG/3ML) 0.083% IN NEBU
2.5000 mg | INHALATION_SOLUTION | Freq: Four times a day (QID) | RESPIRATORY_TRACT | Status: DC | PRN
  Administered 2019-10-22: 2.5 mg via RESPIRATORY_TRACT

## 2019-10-22 MED ORDER — HYDROCODONE-ACETAMINOPHEN 5-325 MG PO TABS
1.0000 | ORAL_TABLET | ORAL | Status: DC | PRN
  Administered 2019-10-23: 1 via ORAL
  Administered 2019-10-26: 2 via ORAL
  Administered 2019-10-26 – 2019-10-30 (×3): 1 via ORAL
  Filled 2019-10-22 (×3): qty 1
  Filled 2019-10-22: qty 2
  Filled 2019-10-22: qty 1

## 2019-10-22 MED ORDER — OXYCODONE-ACETAMINOPHEN 5-325 MG PO TABS: 1.0000 | ORAL_TABLET | Freq: Three times a day (TID) | ORAL | 0 refills | Status: DC | PRN

## 2019-10-22 MED ORDER — ALUM & MAG HYDROXIDE-SIMETH 200-200-20 MG/5ML PO SUSP
30.0000 mL | ORAL | Status: DC | PRN
  Administered 2019-10-23 – 2019-10-24 (×3): 30 mL via ORAL
  Filled 2019-10-22 (×3): qty 30

## 2019-10-22 MED ORDER — ENOXAPARIN SODIUM 40 MG/0.4ML ~~LOC~~ SOLN: 40.0000 mg | Freq: Every day | SUBCUTANEOUS | 13 refills | Status: DC

## 2019-10-22 MED ORDER — PROPOFOL 10 MG/ML IV BOLUS
INTRAVENOUS | Status: DC | PRN
  Administered 2019-10-22: 100 mg via INTRAVENOUS

## 2019-10-22 MED ORDER — ENOXAPARIN SODIUM 40 MG/0.4ML ~~LOC~~ SOLN
40.0000 mg | SUBCUTANEOUS | Status: DC
  Administered 2019-10-23 – 2019-10-30 (×7): 40 mg via SUBCUTANEOUS
  Filled 2019-10-22 (×8): qty 0.4

## 2019-10-22 MED ORDER — POLYETHYLENE GLYCOL 3350 17 G PO PACK: 17.0000 g | PACK | Freq: Every day | ORAL | Status: DC | PRN

## 2019-10-22 MED ORDER — 0.9 % SODIUM CHLORIDE (POUR BTL) OPTIME
TOPICAL | Status: DC | PRN
  Administered 2019-10-22: 1000 mL

## 2019-10-22 MED ORDER — ACETAMINOPHEN 325 MG PO TABS
325.0000 mg | ORAL_TABLET | Freq: Four times a day (QID) | ORAL | Status: DC | PRN
  Administered 2019-10-29: 650 mg via ORAL
  Filled 2019-10-22: qty 2

## 2019-10-22 MED ORDER — LACTATED RINGERS IV SOLN
INTRAVENOUS | Status: DC
  Administered 2019-10-22 (×2): via INTRAVENOUS

## 2019-10-22 MED ORDER — METHOCARBAMOL 500 MG PO TABS
500.0000 mg | ORAL_TABLET | Freq: Four times a day (QID) | ORAL | Status: DC | PRN
  Filled 2019-10-22: qty 1

## 2019-10-22 MED ORDER — HYDROCODONE-ACETAMINOPHEN 7.5-325 MG PO TABS
1.0000 | ORAL_TABLET | ORAL | Status: DC | PRN
  Administered 2019-10-22: 1 via ORAL
  Administered 2019-10-23: 2 via ORAL
  Filled 2019-10-22: qty 2
  Filled 2019-10-22: qty 1
  Filled 2019-10-22: qty 2

## 2019-10-22 MED ORDER — ACETAMINOPHEN 500 MG PO TABS
500.0000 mg | ORAL_TABLET | Freq: Four times a day (QID) | ORAL | Status: AC
  Administered 2019-10-22 – 2019-10-23 (×3): 500 mg via ORAL
  Filled 2019-10-22 (×3): qty 1

## 2019-10-22 MED ORDER — KETOROLAC TROMETHAMINE 15 MG/ML IJ SOLN: 15.0000 mg | Freq: Once | INTRAMUSCULAR | Status: DC | PRN

## 2019-10-22 MED ORDER — PROMETHAZINE HCL 25 MG/ML IJ SOLN: 6.2500 mg | INTRAMUSCULAR | Status: DC | PRN

## 2019-10-22 SURGICAL SUPPLY — 43 items
BIT DRILL SHORT 4.0 (BIT) ×1 IMPLANT
BNDG COHESIVE 4X5 TAN STRL (GAUZE/BANDAGES/DRESSINGS) ×3 IMPLANT
BNDG COHESIVE 6X5 TAN STRL LF (GAUZE/BANDAGES/DRESSINGS) IMPLANT
BNDG GAUZE ELAST 4 BULKY (GAUZE/BANDAGES/DRESSINGS) ×3 IMPLANT
COVER PERINEAL POST (MISCELLANEOUS) ×3 IMPLANT
COVER SURGICAL LIGHT HANDLE (MISCELLANEOUS) ×3 IMPLANT
COVER WAND RF STERILE (DRAPES) ×3 IMPLANT
DRAPE STERI IOBAN 125X83 (DRAPES) ×3 IMPLANT
DRILL BIT SHORT 4.0 (BIT) ×3
DRSG MEPILEX BORDER 4X4 (GAUZE/BANDAGES/DRESSINGS) ×3 IMPLANT
DRSG MEPILEX BORDER 4X8 (GAUZE/BANDAGES/DRESSINGS) ×3 IMPLANT
DRSG PAD ABDOMINAL 8X10 ST (GAUZE/BANDAGES/DRESSINGS) ×6 IMPLANT
DURAPREP 26ML APPLICATOR (WOUND CARE) ×3 IMPLANT
ELECT REM PT RETURN 9FT ADLT (ELECTROSURGICAL) ×3
ELECTRODE REM PT RTRN 9FT ADLT (ELECTROSURGICAL) ×1 IMPLANT
GLOVE BIOGEL PI IND STRL 7.0 (GLOVE) ×1 IMPLANT
GLOVE BIOGEL PI INDICATOR 7.0 (GLOVE) ×2
GLOVE ECLIPSE 7.0 STRL STRAW (GLOVE) ×3 IMPLANT
GLOVE SKINSENSE NS SZ7.5 (GLOVE) ×4
GLOVE SKINSENSE STRL SZ7.5 (GLOVE) ×2 IMPLANT
GOWN STRL REIN XL XLG (GOWN DISPOSABLE) ×3 IMPLANT
GUIDE PIN 3.2X343 (PIN) ×2
GUIDE PIN 3.2X343MM (PIN) ×4
KIT BASIN OR (CUSTOM PROCEDURE TRAY) ×3 IMPLANT
KIT TURNOVER KIT B (KITS) ×3 IMPLANT
MANIFOLD NEPTUNE II (INSTRUMENTS) ×3 IMPLANT
NAIL TRIGEN 10MMX36CM-125 RT (Nail) ×3 IMPLANT
NS IRRIG 1000ML POUR BTL (IV SOLUTION) ×3 IMPLANT
PACK GENERAL/GYN (CUSTOM PROCEDURE TRAY) ×3 IMPLANT
PAD ARMBOARD 7.5X6 YLW CONV (MISCELLANEOUS) ×6 IMPLANT
PAD CAST 4YDX4 CTTN HI CHSV (CAST SUPPLIES) ×2 IMPLANT
PADDING CAST COTTON 4X4 STRL (CAST SUPPLIES) ×6
PIN GUIDE 3.2X343MM (PIN) ×2 IMPLANT
SCREW LAG COMPR KIT 95/90 (Screw) ×3 IMPLANT
SCREW TRIGEN LOW PROF 5.0X37.5 (Screw) ×3 IMPLANT
STAPLER VISISTAT 35W (STAPLE) ×3 IMPLANT
SUT VIC AB 0 CT1 27 (SUTURE) ×2
SUT VIC AB 0 CT1 27XBRD ANBCTR (SUTURE) ×1 IMPLANT
SUT VIC AB 2-0 CT1 27 (SUTURE) ×3
SUT VIC AB 2-0 CT1 TAPERPNT 27 (SUTURE) ×1 IMPLANT
TOWEL GREEN STERILE (TOWEL DISPOSABLE) ×3 IMPLANT
TOWEL GREEN STERILE FF (TOWEL DISPOSABLE) ×3 IMPLANT
WATER STERILE IRR 1000ML POUR (IV SOLUTION) ×3 IMPLANT

## 2019-10-22 NOTE — Progress Notes (Signed)
RN administered mineral oil enema @ 2252.   Patient is requesting additional enema.   RN notified [facility] per patient request.

## 2019-10-22 NOTE — Progress Notes (Signed)
[facility]                                                                              Patient Demographics  Anthony Escobar, is a 68 y.o. male, DOB - 03-30-1951, UVO:[id number]  Admit date - 10/19/2019   Admitting Physician Georgette Shell, MD  Outpatient Primary MD for the patient is [name], MD  Outpatient specialists:   LOS - 3  days    Chief Complaint  Patient presents with  . Hip Pain       Brief summary   68 y.o. malecastration resistant prostate cancer with mets to the bone diagnosed in 2019, status post 6 cycles of Taxotere chemotherapy May 2019 followed by Lupron and Gillermina Phy, presenting with right hip pathological fracture, for which reason patient admitted.  Assessment & Plan    Principal Problem:   Pathologic intertrochanteric fracture, right, initial encounter [facility]) Active Problems:   Hyperkalemia   Hyponatremia    Pathological fracture of the right intertrochanteric hip fracture  History of metastatic prostate cancer s/p ORIF 10/26   Pain control with iv pain meds 1st choices oral opiates Lovenox on Sunday morning 10/21/2019 for DVT prophylaxis WBAT per ortho Patient on narcotics with benzo-watch carefully for overdose -cut back on the dosing   Constipation with Nausea Failed laxative(s) -trial of movantik KUB 12/20/19 -P OOB to Chair as tol  Hyponatremic + AKI Continue IV saline 100 cc/h for 24 hr and am labs Hold home naprocen and lisinopril and adjust priro to d/c  Leukocytosis-improving, urinalysis negative, no evidence of infectious focus, follow clinically   Code Status: Full code DVT Prophylaxis:  Lovenox  Family Communication:   Disposition Plan: To be determined  Time Spent in minutes 25 minutes  Procedures:    Consultants:   Dr Erlinda Hong, ortho  Antimicrobials:      Medications  Scheduled Meds: . albuterol      . [MAR Hold] amitriptyline  75 mg Oral QHS  . [MAR Hold] atorvastatin  20 mg  Oral Daily  . [MAR Hold] bisacodyl  10 mg Oral Daily  . [MAR Hold] docusate sodium  200 mg Oral Daily  . [MAR Hold] enzalutamide  160 mg Oral Daily  . [MAR Hold] mometasone-formoterol  2 puff Inhalation BID  . [MAR Hold] naloxegol oxalate  12.5 mg Oral Daily  . [MAR Hold] polyethylene glycol  17 g Oral Daily   Continuous Infusions: . sodium chloride 125 mL/hr (10/21/19 2144)  . lactated ringers 10 mL/hr at 10/22/19 1328   PRN Meds:.albuterol, [MAR Hold] albuterol, fentaNYL (SUBLIMAZE) injection, [MAR Hold]  HYDROmorphone (DILAUDID) injection, ketorolac, [MAR Hold] LORazepam, [MAR Hold]  morphine injection, [MAR Hold] ondansetron (ZOFRAN) IV, [MAR Hold] oxyCODONE-acetaminophen, promethazine   Antibiotics   Anti-infectives (From admission, onward)   Start     Dose/Rate Route Frequency Ordered Stop   10/22/19 1330  [MAR Hold]  ceFAZolin (ANCEF) IVPB 2g/100 mL premix     (MAR Hold since Mon 10/22/2019 at 1257.Hold Reason: Transfer to a Procedural area.)  Note to Pharmacy: Anesthesia to give preop   2 g 200 mL/hr over 30 Minutes Intravenous  Once  10/21/19 1853 10/22/19 1454        Subjective:   Awake cogherent in nad seen in PCAU no distress  Objective:   Vitals:   10/22/19 1618 10/22/19 1634 10/22/19 1655 10/22/19 1659  BP: (!) 150/97 (!) 116/95 (!) 161/91   Pulse: (!) 113 (!) 106  (!) 115  Resp: (!) 29 18 (!) 24 20  Temp: 97.6 F (36.4 C)     TempSrc:      SpO2: 97% (!) 89%  91%  Weight:      Height:        Intake/Output Summary (Last 24 hours) at 10/22/2019 1705 Last data filed at 10/22/2019 1612 Gross per 24 hour  Intake 2050 ml  Output 1800 ml  Net 250 ml     Wt Readings from Last 3 Encounters:  10/19/19 71.2 kg  10/17/19 71.2 kg  10/16/19 71.6 kg     Exam  Awake coherent  eomi ncat  c5ta b no adde dsound  s1 s 2no m seems nsr  abd soft nt nd   Data Reviewed:  I have personally reviewed following labs and imaging studies  Micro  Results Recent Results (from the past 240 hour(s))  Novel Coronavirus, NAA (Hosp order, Send-out to Ref Lab; TAT 18-24 hrs     Status: None   Collection Time: 10/18/19 11:12 AM   Specimen: Nasopharyngeal Swab; Respiratory  Result Value Ref Range Status   SARS-CoV-2, NAA NOT DETECTED NOT DETECTED Final    Comment: (NOTE) This nucleic acid amplification test was developed and its performance characteristics determined by [employer]. Nucleic acid amplification tests include PCR and TMA. This test has not been FDA cleared or approved. This test has been authorized by FDA under an Emergency Use Authorization (EUA). This test is only authorized for the duration of time the declaration that circumstances exist justifying the authorization of the emergency use of in vitro diagnostic tests for detection of SARS-CoV-2 virus and/or diagnosis of COVID-19 infection under section 564(b)(1) of the Act, 21 U.S.C. 371IRC-7(E) (1), unless the authorization is terminated or revoked sooner. When diagnostic testing is negative, the possibility of a false negative result should be considered in the context of a patient's recent exposures and the presence of clinical signs and symptoms consistent with COVID-19. An individual without symptoms of COVID- 19 and who is not shedding SARS-CoV-2 vi rus would expect to have a negative (not detected) result in this assay. Performed At: [facility] [address] [place], [place] [id number] Rush Farmer MD WC:[id number]    [place]  Final    Comment: Performed at [facility], [place] [address], [place], [place] [address]  Surgical pcr screen     Status: None   Collection Time: 10/21/19  5:12 AM   Specimen: Nasal Mucosa; Nasal Swab  Result Value Ref Range Status   MRSA, PCR NEGATIVE NEGATIVE Final   Staphylococcus aureus NEGATIVE NEGATIVE Final    Comment: (NOTE) The Xpert SA Assay (FDA approved for NASAL  specimens in patients 11 years of age and older), is one component of a comprehensive surveillance program. It is not intended to diagnose infection nor to guide or monitor treatment. Performed at [facility], [place] [address] [place], [place] [address]     Radiology Reports Dg Chest 1 View  Result Date: 10/19/2019 CLINICAL DATA:  Leukocytosis. EXAM: CHEST  1 VIEW COMPARISON:  October 18, 2017. FINDINGS: The heart size and mediastinal contours are within normal  limits. Pulmonary nodules noted on prior radiograph are not well visualized currently. No consolidative process is noted. The visualized skeletal structures are unremarkable. IMPRESSION: No active disease. Electronically Signed   By: Marijo Conception M.D.   On: 10/19/2019 15:54   Mr Hip Right W Wo Contrast  Result Date: 10/16/2019 CLINICAL DATA:  Prostate cancer, continued hip pain EXAM: MRI OF THE RIGHT HIP WITHOUT AND WITH CONTRAST TECHNIQUE: Multiplanar, multisequence MR imaging was performed both before and after administration of intravenous contrast. CONTRAST:  7.58mL GADAVIST GADOBUTROL 1 MMOL/ML IV SOLN COMPARISON:  CT February 28, 2019 FINDINGS: Bones: There is extensive T1 heterogeneous L/T2 bright multilocular cystic lytic lesion seen throughout the right femoral head, neck, greater and lesser trochanters as well as the proximal femur. Multiple fluid fluid levels are seen, likely blood products layering within the cystic lesions. There does appear to be areas of enhancement within the lesions. Cortical breakthrough seen along the posterior greater trochanter and along the lateral surface of the greater trochanter. There is enhancement involving the synovium. There is a small femoroacetabular joint effusion. No definite fracture seen. There is increased marrow signal seen within the proximal femur, likely marrow edema. There does appear to be a small subchondral cystic lesion in the superior right acetabulum which does show  enhancement, and could represent a focus of disease. No other osseous lesions are identified. There is moderate bilateral hip osteoarthritis with superior joint space loss and small subchondral cystic changes. The visualized bony pelvis appears normal. The visualized sacroiliac joints and symphysis pubis appear normal. Articular cartilage and labrum Articular cartilage: There is diffuse chondral thinning seen within the bilateral femoroacetabular joints. Labrum: There is no gross labral tear or paralabral abnormality. Joint or bursal effusion Joint effusion: A small joint effusion is seen at the right femoroacetabular joint with diffuse surrounding synovial enhancement. Bursae: No focal periarticular fluid collection. Muscles and tendons Muscles and tendons: There is increased signal with enhancement seen within the gluteal musculature and at the iliopsoas insertion site. The there is increased intrasubstance signal seen at the iliopsoas, gluteal, and hamstrings insertion site. Other findings Miscellaneous: There is a heterogeneous appearing prostate gland. Scattered colonic diverticula are noted. The remainder of the deep pelvis is grossly unremarkable. IMPRESSION: 1. Multi lobular extensive enhancing lytic lesions throughout the right femoral head, neck, greater and lesser trochanters extending into the proximal femoral shaft, consistent with the patient's known metastatic disease. There are multiple air fluid fluid levels with areas of cortical breakthrough seen along the greater trochanter. 2. Diffuse marrow edema within the adjacent femoral head and proximal femoral shaft. No definite fracture however seen. 3. Small enhancing cystic lesion in the superior acetabulum which likely also represent a focus of metastatic disease 4. Moderate bilateral hip osteoarthritis. Electronically Signed   By: Prudencio Pair M.D.   On: 10/16/2019 19:23   Dg Abd Portable 1v  Result Date: 10/21/2019 CLINICAL DATA:  Abdominal  pain and distension EXAM: PORTABLE ABDOMEN - 1 VIEW COMPARISON:  None. FINDINGS: There is moderate stool in the colon. There is no appreciable bowel dilatation or air-fluid level to suggest bowel obstruction. No free air. Air is noted within the rectum. There are apparent phleboliths in the pelvis. Visualized lung bases are clear. IMPRESSION: Moderate stool in colon. There is air throughout much of the bowel without bowel dilatation. Bowel obstruction not felt to be likely. No free air. Electronically Signed   By: Lowella Grip III M.D.   On: 10/21/2019 10:28  Dg Hip Unilat With Pelvis 1v Right  Result Date: 10/19/2019 CLINICAL DATA:  Right hip pain EXAM: DG HIP (WITH OR WITHOUT PELVIS) 1V RIGHT COMPARISON:  MRI 10/16/2019, CT 02/28/2019 FINDINGS: Mixed lytic and sclerotic lesion involving the right femoral head, neck, trochanter and proximal shaft. Acute slightly impacted appearing fracture involving the base of the femoral neck. No femoral head dislocation. Left hip demonstrates normal alignment. IMPRESSION: Mixed lytic and sclerotic lesion involving the proximal right femur, with evidence of acute mildly impacted fracture involving the base of the femoral neck, consistent with pathologic fracture. Electronically Signed   By: Donavan Foil M.D.   On: 10/19/2019 15:22    Lab Data:  CBC: Recent Labs  Lab 10/16/19 0824 10/19/19 1300 10/20/19 0924 10/21/19 0625  WBC 9.1 18.4* 14.1* 14.9*  NEUTROABS  --  15.9*  --   --   HGB 13.1 11.3* 11.7* 13.9  HCT 39.0 34.4* 34.8* 41.3  MCV 100.0 102.4* 101.8* 100.7*  PLT 533* 425* 430* 202   Basic Metabolic Panel: Recent Labs  Lab 10/16/19 0824 10/19/19 1300 10/21/19 0625  NA 136 127* 136  K 4.3 5.4* 4.4  CL 101 97* 102  CO2 23 22 21*  GLUCOSE 112* 108* 102*  BUN 23 29* 23  CREATININE 1.20 1.08 1.14  CALCIUM 9.9 9.1 9.4   GFR: Estimated Creatinine Clearance: 60 mL/min (by C-G formula based on SCr of 1.14 mg/dL). Liver Function  Tests: Recent Labs  Lab 10/16/19 0824 10/21/19 0625  AST 21 43*  ALT 9 13  ALKPHOS 114 197*  BILITOT 0.2* 0.7  PROT 7.5 6.4*  ALBUMIN 3.9 3.0*   No results for input(s): LIPASE, AMYLASE in the last 168 hours. No results for input(s): AMMONIA in the last 168 hours. Coagulation Profile: No results for input(s): INR, PROTIME in the last 168 hours. Cardiac Enzymes: No results for input(s): CKTOTAL, CKMB, CKMBINDEX, TROPONINI in the last 168 hours. BNP (last 3 results) No results for input(s): PROBNP in the last 8760 hours. HbA1C: No results for input(s): HGBA1C in the last 72 hours. CBG: No results for input(s): GLUCAP in the last 168 hours. Lipid Profile: No results for input(s): CHOL, HDL, LDLCALC, TRIG, CHOLHDL, LDLDIRECT in the last 72 hours. Thyroid Function Tests: No results for input(s): TSH, T4TOTAL, FREET4, T3FREE, THYROIDAB in the last 72 hours. Anemia Panel: No results for input(s): VITAMINB12, FOLATE, FERRITIN, TIBC, IRON, RETICCTPCT in the last 72 hours. Urine analysis:    Component Value Date/Time   COLORURINE YELLOW 10/19/2019 1609   APPEARANCEUR CLEAR 10/19/2019 1609   LABSPEC 1.011 10/19/2019 1609   PHURINE 5.0 10/19/2019 1609   GLUCOSEU NEGATIVE 10/19/2019 1609   HGBUR NEGATIVE 10/19/2019 1609   BILIRUBINUR NEGATIVE 10/19/2019 [place] 10/19/2019 1609   PROTEINUR NEGATIVE 10/19/2019 1609   NITRITE NEGATIVE 10/19/2019 1609   LEUKOCYTESUR NEGATIVE 10/19/2019 1609    Verneita Griffes, MD [facility] 5:06 PM

## 2019-10-22 NOTE — Op Note (Addendum)
Date of Surgery: 10/22/2019  INDICATIONS: Mr. Chiriboga is a 68 y.o.-year-old male who sustained a right hip fracture. The risks and benefits of the procedure discussed with the patient prior to the procedure and all questions were answered; consent was obtained.  PREOPERATIVE DIAGNOSIS: right pathologic intertrochanteric hip fracture   POSTOPERATIVE DIAGNOSIS: Same   PROCEDURE: Open treatment of intertrochanteric fracture with intramedullary implant. CPT [phone]   SURGEON: N. Eduard Roux, M.D.   ASSIST: Ciro Backer [place], [place]; necessary for the timely completion of procedure and due to complexity of procedure.  ANESTHESIA: general   IV FLUIDS AND URINE: See anesthesia record   ESTIMATED BLOOD LOSS: 300 cc  IMPLANTS: [employer] InterTAN 10 x 36, 95/90 lag screws  DRAINS: None.   COMPLICATIONS: see description of procedure.   DESCRIPTION OF PROCEDURE: The patient was brought to the operating room and placed supine on the operating table. The patient's leg had been signed prior to the procedure. The patient had the anesthesia placed by the anesthesiologist. The prep verification and incision time-outs were performed to confirm that this was the correct patient, site, side and location. The patient had an SCD on the opposite lower extremity. The patient did receive antibiotics prior to the incision and was re-dosed during the procedure as needed at indicated intervals. The patient was positioned on the fracture table with the table in traction and internal rotation to reduce the hip. The well leg was placed in a scissor position and all bony prominences were well-padded. The patient had the lower extremity prepped and draped in the standard surgical fashion. The incision was made 4 finger breadths superior to the greater trochanter. A guide pin was inserted into the tip of the greater trochanter under fluoroscopic guidance. An opening reamer was used to gain access to the femoral  canal. The nail length was measured and inserted down the femoral canal to its proper depth. The appropriate version of insertion for the lag screw was found under fluoroscopy. A pin was inserted up the femoral neck through the jig. Then, a second antirotation pin was inserted inferior to the first pin. The length of the lag screw was then measured. The lag screw was inserted as near to center-center in the head as possible. The antirotation pin was then taken out and an interdigitating compression screw was placed in its place. The leg was taken out of traction, then the interdigitating compression screw was used to compress across the fracture. Compression was visualized on serial xrays.  A distal interlocking screw was placed using the perfect circle technique.  The wound was copiously irrigated with saline and the subcutaneous layer closed with 2.0 vicryl and the skin was reapproximated with staples. The wounds were cleaned and dried a final time and a sterile dressing was placed. The hip was taken through a range of motion at the end of the case under fluoroscopic imaging to visualize the approach-withdraw phenomenon and confirm implant length in the head. The patient was then awakened from anesthesia and taken to the recovery room in stable condition. All counts were correct at the end of the case.   POSTOPERATIVE PLAN: The patient will be weight bearing as tolerated and will return in 2 weeks for staple removal and the patient will receive DVT prophylaxis based on other medications, activity level, and risk ratio of bleeding to thrombosis.  He needs to begin radiation to the right proximal femur tumor 1 week after surgery.     Ashok Cordia  Erlinda Hong, MD [facility] 3:45 PM

## 2019-10-22 NOTE — Anesthesia Procedure Notes (Signed)
Procedure Name: Intubation Date/Time: 10/22/2019 2:49 PM Performed by: Rondall Radigan T, CRNA Pre-anesthesia Checklist: Patient identified, Emergency Drugs available, Suction available and Patient being monitored Patient Re-evaluated:Patient Re-evaluated prior to induction Oxygen Delivery Method: Circle system utilized Preoxygenation: Pre-oxygenation with 100% oxygen Induction Type: IV induction Ventilation: Mask ventilation without difficulty Laryngoscope Size: Miller and 3 Grade View: Grade I Tube type: Oral Tube size: 7.5 mm Number of attempts: 1 Airway Equipment and Method: Patient positioned with wedge pillow and Stylet Placement Confirmation: ETT inserted through vocal cords under direct vision,  positive ETCO2 and breath sounds checked- equal and bilateral Secured at: 21 cm Tube secured with: Tape Dental Injury: Teeth and Oropharynx as per pre-operative assessment

## 2019-10-22 NOTE — Discharge Instructions (Signed)
° ° °  1. Change dressings as needed °2. May shower but keep incisions covered and dry °3. Take lovenox to prevent blood clots °4. Take stool softeners as needed °5. Take pain meds as needed ° °

## 2019-10-22 NOTE — Transfer of Care (Signed)
Immediate Anesthesia Transfer of Care Note  Patient: Anthony Escobar  Procedure(s) Performed: RIGHT INTERTROCHANTERIC INTRAMEDULLARY (IM) NAIL (Right )  Patient Location: PACU  Anesthesia Type:General  Level of Consciousness: drowsy  Airway & Oxygen Therapy: Patient Spontanous Breathing and Patient connected to face mask oxygen  Post-op Assessment: Report given to RN, Post -op Vital signs reviewed and stable and Patient moving all extremities  Post vital signs: Reviewed and stable  Last Vitals:  Vitals Value Taken Time  BP 150/97 10/22/19 1618  Temp 36.4 C 10/22/19 1618  Pulse 112 10/22/19 1622  Resp 17 10/22/19 1622  SpO2 95 % 10/22/19 1622  Vitals shown include unvalidated device data.  Last Pain:  Vitals:   10/22/19 1618  TempSrc:   PainSc: Asleep         Complications: No apparent anesthesia complications

## 2019-10-22 NOTE — H&P (Signed)

## 2019-10-23 ENCOUNTER — Ambulatory Visit: Payer: Medicare Other | Admitting: Radiation Oncology

## 2019-10-23 ENCOUNTER — Ambulatory Visit: Admission: RE | Admit: 2019-10-23 | Payer: Medicare Other | Source: Ambulatory Visit

## 2019-10-23 ENCOUNTER — Ambulatory Visit
Admission: RE | Admit: 2019-10-23 | Discharge: 2019-10-23 | Disposition: A | Discharge status: Home / Self Care | Payer: Medicare Other | Source: Ambulatory Visit | Attending: Radiation Oncology | Admitting: Radiation Oncology

## 2019-10-23 LAB — RENAL FUNCTION PANEL
Albumin: 2.1 g/dL — ABNORMAL LOW (ref 3.5–5.0)
Anion gap: 9 (ref 5–15)
BUN: 18 mg/dL (ref 8–23)
CO2: 24 mmol/L (ref 22–32)
Calcium: 8.3 mg/dL — ABNORMAL LOW (ref 8.9–10.3)
Chloride: 101 mmol/L (ref 98–111)
Creatinine, Ser: 0.72 mg/dL (ref 0.61–1.24)
GFR calc Af Amer: >60 mL/min (ref >60)
GFR calc non Af Amer: >60 mL/min (ref >60)
Glucose, Bld: 131 mg/dL — ABNORMAL HIGH (ref 70–99)
Phosphorus: 2.3 mg/dL — ABNORMAL LOW (ref 2.5–4.6)
Potassium: 4.2 mmol/L (ref 3.5–5.1)
Sodium: 134 mmol/L — ABNORMAL LOW (ref 135–145)

## 2019-10-23 LAB — CBC WITH DIFFERENTIAL/PLATELET
Abs Immature Granulocytes: 0.03 10*3/uL (ref 0.00–0.07)
Basophils Absolute: 0 10*3/uL (ref 0.0–0.1)
Basophils Relative: 0 %
Eosinophils Absolute: 0 10*3/uL (ref 0.0–0.5)
Eosinophils Relative: 0 %
HCT: 29.1 % — ABNORMAL LOW (ref 39.0–52.0)
Hemoglobin: 9.8 g/dL — ABNORMAL LOW (ref 13.0–17.0)
Immature Granulocytes: 0 %
Lymphocytes Relative: 5 %
Lymphs Abs: 0.6 10*3/uL — ABNORMAL LOW (ref 0.7–4.0)
MCH: 34 pg (ref 26.0–34.0)
MCHC: 33.7 g/dL (ref 30.0–36.0)
MCV: 101 fL — ABNORMAL HIGH (ref 80.0–100.0)
Monocytes Absolute: 1.3 10*3/uL — ABNORMAL HIGH (ref 0.1–1.0)
Monocytes Relative: 12 %
Neutro Abs: 9.1 10*3/uL — ABNORMAL HIGH (ref 1.7–7.7)
Neutrophils Relative %: 83 %
Platelets: 425 10*3/uL — ABNORMAL HIGH (ref 150–400)
RBC: 2.88 MIL/uL — ABNORMAL LOW (ref 4.22–5.81)
RDW: 12.5 % (ref 11.5–15.5)
WBC: 11.1 10*3/uL — ABNORMAL HIGH (ref 4.0–10.5)
nRBC: 0 % (ref 0.0–0.2)

## 2019-10-23 MED ORDER — CHLORHEXIDINE GLUCONATE CLOTH 2 % EX PADS
6.0000 | MEDICATED_PAD | Freq: Every day | CUTANEOUS | Status: DC
  Administered 2019-10-24 – 2019-10-28 (×4): 6 via TOPICAL

## 2019-10-23 MED ORDER — POVIDONE-IODINE 10 % EX SWAB: 2.0000 "application " | Freq: Once | CUTANEOUS | Status: AC

## 2019-10-23 MED ORDER — TRANEXAMIC ACID 1000 MG/10ML IV SOLN: 2000.0000 mg | INTRAVENOUS | Status: DC

## 2019-10-23 MED ORDER — TRANEXAMIC ACID-NACL 1000-0.7 MG/100ML-% IV SOLN: 1000.0000 mg | INTRAVENOUS | Status: DC

## 2019-10-23 MED ORDER — CEFAZOLIN SODIUM-DEXTROSE 2-4 GM/100ML-% IV SOLN: 2.0000 g | INTRAVENOUS | Status: DC

## 2019-10-23 MED ORDER — FLEET ENEMA 7-19 GM/118ML RE ENEM
1.0000 | ENEMA | Freq: Once | RECTAL | Status: AC
  Administered 2019-10-23: 1 via RECTAL
  Filled 2019-10-23: qty 1

## 2019-10-23 NOTE — Progress Notes (Addendum)
[facility]                                                                              Patient Demographics  Anthony Escobar, is a 68 y.o. male, DOB - 28-Nov-1951, DXI:[id number]  Admit date - 10/19/2019   Admitting Physician Georgette Shell, MD  Outpatient Primary MD for the patient is [name], MD  Outpatient specialists:   LOS - 4  days    Chief Complaint  Patient presents with  . Hip Pain       Brief summary   68 y.o. 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  Assessment & Plan    Principal Problem:   Pathologic intertrochanteric fracture, right, initial encounter [facility]) Active Problems:   Hyperkalemia   Hyponatremia    Pathological fracture of the right intertrochanteric hip fracture History of metastatic prostate cancer s/p ORIF 10/26   Pain control with iv pain meds 1st choices oral opiates because of somnolence discontinue Dilaudid continue morphine and use Norco at varying doses for oral pain management Lovenox  10/21/2019 for DVT prophylaxis -has not had therapy yet so does need postop instructions and recommendations prior to decisions about disposition Patient on narcotics with benzo-watch carefully for overdose -cut back on the dosing   Constipation with Nausea Failed laxative(s) -trial of movantik KUB 12/20/19 -P OOB to Chair as tol  Hyponatremic + AKI DC IV saline 75 and allow diet Hold home naprocen and lisinopril and adjust prior to d/c  Leukocytosis-improving, urinalysis negative, no evidence of infectious focus, follow clinically   Code Status: Full code DVT Prophylaxis:  Lovenox  Family Communication:   Disposition Plan: T await input from therapy  Time Spent in minutes 25 minutes  Procedures:     Consultants:   Dr Erlinda Hong, ortho  Antimicrobials:      Medications  Scheduled Meds: . acetaminophen  500 mg Oral Q6H  . amitriptyline  75 mg Oral QHS  . atorvastatin  20 mg Oral Daily  . bisacodyl  10 mg Oral Daily  . docusate sodium  100 mg Oral BID  . enoxaparin (LOVENOX) injection  40 mg Subcutaneous Q24H  . enzalutamide  160 mg Oral Daily  . mometasone-formoterol  2 puff Inhalation BID  . naloxegol oxalate  12.5 mg Oral Daily  . polyethylene glycol  17 g Oral Daily   Continuous Infusions: . sodium chloride Stopped (10/23/19 0339)  . methocarbamol (ROBAXIN) IV     PRN Meds:.acetaminophen, albuterol, alum & mag hydroxide-simeth, HYDROcodone-acetaminophen, HYDROcodone-acetaminophen, HYDROmorphone (DILAUDID) injection, LORazepam, magnesium citrate, menthol-cetylpyridinium **OR** phenol, methocarbamol **OR** methocarbamol (ROBAXIN) IV, morphine injection, ondansetron **OR** ondansetron (ZOFRAN) IV, polyethylene glycol, sorbitol   Antibiotics   Anti-infectives (From admission, onward)   Start     Dose/Rate Route Frequency Ordered Stop   10/23/19 0600  ceFAZolin (ANCEF) IVPB 2g/100 mL premix  Status:  Discontinued     2 g 200 mL/hr over 30 Minutes Intravenous On call to O.R.  10/23/19 0537 10/23/19 0544   10/22/19 2030  ceFAZolin (ANCEF) IVPB 2g/100 mL premix     2 g 200 mL/hr over 30 Minutes Intravenous Every 6 hours 10/22/19 1756 10/23/19 0902   10/22/19 1330  ceFAZolin (ANCEF) IVPB 2g/100 mL premix    Note to Pharmacy: Anesthesia to give preop   2 g 200 mL/hr over 30 Minutes Intravenous  Once 10/21/19 1853 10/22/19 1454     Subjective:   Doing fair no distress   sleepy some   Objective:   Vitals:   10/22/19 2319 10/23/19 0346 10/23/19 0737 10/23/19 0757  BP: 123/82 135/79 112/79   Pulse: 97 (!) 109 96   Resp: 16 18 18    Temp: 98.9 F (37.2 C) 97.9 F (36.6 C) 98.3 F (36.8 C)   TempSrc: Oral Oral Oral   SpO2: 92% 91% 92% 93%  Weight:      Height:         Intake/Output Summary (Last 24 hours) at 10/23/2019 1120 Last data filed at 10/23/2019 0902 Gross per 24 hour  Intake 3501.07 ml  Output 2200 ml  Net 1301.07 ml     Wt Readings from Last 3 Encounters:  10/19/19 71.2 kg  10/17/19 71.2 kg  10/16/19 71.6 kg     Exam  Sleepy-woke up  eomi ncat  cta b no added sound  s1 s2 no m seems nsr  abd soft mild epig tenderness and seems a little distended on exam   Data Reviewed:  I have personally reviewed following labs and imaging studies  Micro Results Recent Results (from the past 240 hour(s))  Novel Coronavirus, NAA (Hosp order, Send-out to [facility]; TAT 18-24 hrs     Status: None   Collection Time: 10/18/19 11:12 AM   Specimen: Nasopharyngeal Swab; Respiratory  Result Value Ref Range Status   SARS-CoV-2, NAA NOT DETECTED NOT DETECTED Final    Comment: (NOTE) This nucleic acid amplification test was developed and its performance characteristics determined by [employer]. Nucleic acid amplification tests include PCR and TMA. This test has not been FDA cleared or approved. This test has been authorized by FDA under an Emergency Use Authorization (EUA). This test is only authorized for the duration of time the declaration that circumstances exist justifying the authorization of the emergency use of in vitro diagnostic tests for detection of SARS-CoV-2 virus and/or diagnosis of COVID-19 infection under section 564(b)(1) of the Act, 21 U.S.C. 465KCL-2(X) (1), unless the authorization is terminated or revoked sooner. When diagnostic testing is negative, the possibility of a false negative result should be considered in the context of a patient's recent exposures and the presence of clinical signs and symptoms consistent with COVID-19. An individual without symptoms of COVID- 19 and who is not shedding SARS-CoV-2 vi rus would expect to have a negative (not detected) result in this assay. Performed At: [facility] [address] [place], [place] [id number] Rush Farmer MD SW:[id number]    [place]  Final    Comment: Performed at [facility], [place] [address], [place], [place] [address]  Surgical pcr screen     Status: None   Collection Time: 10/21/19  5:12 AM   Specimen: Nasal Mucosa; Nasal Swab  Result Value Ref Range Status   MRSA, PCR NEGATIVE NEGATIVE Final   Staphylococcus aureus NEGATIVE NEGATIVE Final    Comment: (NOTE) The Xpert SA Assay (FDA approved for NASAL specimens in patients 31 years of age and older), is one  component of a comprehensive surveillance program. It is not intended to diagnose infection nor to guide or monitor treatment. Performed at [facility], [place] [address] [place], [place] [address]     Radiology Reports Dg Chest 1 View  Result Date: 10/19/2019 CLINICAL DATA:  Leukocytosis. EXAM: CHEST  1 VIEW COMPARISON:  October 18, 2017. FINDINGS: The heart size and mediastinal contours are within normal limits. Pulmonary nodules noted on prior radiograph are not well visualized currently. No consolidative process is noted. The visualized skeletal structures are unremarkable. IMPRESSION: No active disease. Electronically Signed   By: Marijo Conception M.D.   On: 10/19/2019 15:54   Mr Hip Right W Wo Contrast  Result Date: 10/16/2019 CLINICAL DATA:  Prostate cancer, continued hip pain EXAM: MRI OF THE RIGHT HIP WITHOUT AND WITH CONTRAST TECHNIQUE: Multiplanar, multisequence MR imaging was performed both before and after administration of intravenous contrast. CONTRAST:  7.12mL GADAVIST GADOBUTROL 1 MMOL/ML IV SOLN COMPARISON:  CT February 28, 2019 FINDINGS: Bones: There is extensive T1 heterogeneous L/T2 bright multilocular cystic lytic lesion seen throughout the right femoral head, neck, greater and lesser trochanters as well as the proximal femur. Multiple fluid fluid levels are seen, likely blood products layering within the  cystic lesions. There does appear to be areas of enhancement within the lesions. Cortical breakthrough seen along the posterior greater trochanter and along the lateral surface of the greater trochanter. There is enhancement involving the synovium. There is a small femoroacetabular joint effusion. No definite fracture seen. There is increased marrow signal seen within the proximal femur, likely marrow edema. There does appear to be a small subchondral cystic lesion in the superior right acetabulum which does show enhancement, and could represent a focus of disease. No other osseous lesions are identified. There is moderate bilateral hip osteoarthritis with superior joint space loss and small subchondral cystic changes. The visualized bony pelvis appears normal. The visualized sacroiliac joints and symphysis pubis appear normal. Articular cartilage and labrum Articular cartilage: There is diffuse chondral thinning seen within the bilateral femoroacetabular joints. Labrum: There is no gross labral tear or paralabral abnormality. Joint or bursal effusion Joint effusion: A small joint effusion is seen at the right femoroacetabular joint with diffuse surrounding synovial enhancement. Bursae: No focal periarticular fluid collection. Muscles and tendons Muscles and tendons: There is increased signal with enhancement seen within the gluteal musculature and at the iliopsoas insertion site. The there is increased intrasubstance signal seen at the iliopsoas, gluteal, and hamstrings insertion site. Other findings Miscellaneous: There is a heterogeneous appearing prostate gland. Scattered colonic diverticula are noted. The remainder of the deep pelvis is grossly unremarkable. IMPRESSION: 1. Multi lobular extensive enhancing lytic lesions throughout the right femoral head, neck, greater and lesser trochanters extending into the proximal femoral shaft, consistent with the patient's known metastatic disease. There are multiple air  fluid fluid levels with areas of cortical breakthrough seen along the greater trochanter. 2. Diffuse marrow edema within the adjacent femoral head and proximal femoral shaft. No definite fracture however seen. 3. Small enhancing cystic lesion in the superior acetabulum which likely also represent a focus of metastatic disease 4. Moderate bilateral hip osteoarthritis. Electronically Signed   By: Prudencio Pair M.D.   On: 10/16/2019 19:23   Dg Abd Portable 1v  Result Date: 10/21/2019 CLINICAL DATA:  Abdominal pain and distension EXAM: PORTABLE ABDOMEN - 1 VIEW COMPARISON:  None. FINDINGS: There is moderate stool in the colon. There is no appreciable bowel dilatation or  air-fluid level to suggest bowel obstruction. No free air. Air is noted within the rectum. There are apparent phleboliths in the pelvis. Visualized lung bases are clear. IMPRESSION: Moderate stool in colon. There is air throughout much of the bowel without bowel dilatation. Bowel obstruction not felt to be likely. No free air. Electronically Signed   By: Lowella Grip III M.D.   On: 10/21/2019 10:28   Dg C-arm 1-60 Min  Result Date: 10/22/2019 CLINICAL DATA:  ORIF of right femur fracture. EXAM: RIGHT FEMUR 2 VIEWS; DG C-ARM 1-60 MIN COMPARISON:  Radiographs 10/19/2019. MRI 10/16/2019. FINDINGS: Four spot fluoroscopic images demonstrate ORIF of the pathologic fracture involving the intertrochanteric region of the right femur using a dynamic compression screw and intramedullary rod. The hardware is well positioned. There is near anatomic reduction of the fracture. No demonstrated complications. IMPRESSION: Near anatomic reduction of pathologic fracture of the proximal right femur status post ORIF. Electronically Signed   By: Richardean Sale M.D.   On: 10/22/2019 17:30   Dg Hip Unilat With Pelvis 1v Right  Result Date: 10/19/2019 CLINICAL DATA:  Right hip pain EXAM: DG HIP (WITH OR WITHOUT PELVIS) 1V RIGHT COMPARISON:  MRI 10/16/2019, CT  02/28/2019 FINDINGS: Mixed lytic and sclerotic lesion involving the right femoral head, neck, trochanter and proximal shaft. Acute slightly impacted appearing fracture involving the base of the femoral neck. No femoral head dislocation. Left hip demonstrates normal alignment. IMPRESSION: Mixed lytic and sclerotic lesion involving the proximal right femur, with evidence of acute mildly impacted fracture involving the base of the femoral neck, consistent with pathologic fracture. Electronically Signed   By: Donavan Foil M.D.   On: 10/19/2019 15:22   Dg Femur, Min 2 Views Right  Result Date: 10/22/2019 CLINICAL DATA:  ORIF of right femur fracture. EXAM: RIGHT FEMUR 2 VIEWS; DG C-ARM 1-60 MIN COMPARISON:  Radiographs 10/19/2019. MRI 10/16/2019. FINDINGS: Four spot fluoroscopic images demonstrate ORIF of the pathologic fracture involving the intertrochanteric region of the right femur using a dynamic compression screw and intramedullary rod. The hardware is well positioned. There is near anatomic reduction of the fracture. No demonstrated complications. IMPRESSION: Near anatomic reduction of pathologic fracture of the proximal right femur status post ORIF. Electronically Signed   By: Richardean Sale M.D.   On: 10/22/2019 17:30    Lab Data:  CBC: Recent Labs  Lab 10/19/19 1300 10/20/19 0924 10/21/19 0625 10/22/19 1930 10/23/19 0345  WBC 18.4* 14.1* 14.9* 14.9* 11.1*  NEUTROABS 15.9*  --   --   --  9.1*  HGB 11.3* 11.7* 13.9 10.0* 9.8*  HCT 34.4* 34.8* 41.3 29.6* 29.1*  MCV 102.4* 101.8* 100.7* 99.7 101.0*  PLT 425* 430* 391 405* 161*   Basic Metabolic Panel: Recent Labs  Lab 10/19/19 1300 10/21/19 0625 10/22/19 1940 10/23/19 0345  NA 127* 136 133* 134*  K 5.4* 4.4 4.0 4.2  CL 97* 102 101 101  CO2 22 21* 22 24  GLUCOSE 108* 102* 134* 131*  BUN 29* 23 21 18   CREATININE 1.08 1.14 0.75 0.72  CALCIUM 9.1 9.4 8.5* 8.3*  PHOS  --   --   --  2.3*   GFR: Estimated Creatinine Clearance:  85.5 mL/min (by C-G formula based on SCr of 0.72 mg/dL). Liver Function Tests: Recent Labs  Lab 10/21/19 0625 10/23/19 0345  AST 43*  --   ALT 13  --   ALKPHOS 197*  --   BILITOT 0.7  --   PROT 6.4*  --  ALBUMIN 3.0* 2.1*   No results for input(s): LIPASE, AMYLASE in the last 168 hours. No results for input(s): AMMONIA in the last 168 hours. Coagulation Profile: No results for input(s): INR, PROTIME in the last 168 hours. Cardiac Enzymes: No results for input(s): CKTOTAL, CKMB, CKMBINDEX, TROPONINI in the last 168 hours. BNP (last 3 results) No results for input(s): PROBNP in the last 8760 hours. HbA1C: No results for input(s): HGBA1C in the last 72 hours. CBG: No results for input(s): GLUCAP in the last 168 hours. Lipid Profile: No results for input(s): CHOL, HDL, LDLCALC, TRIG, CHOLHDL, LDLDIRECT in the last 72 hours. Thyroid Function Tests: No results for input(s): TSH, T4TOTAL, FREET4, T3FREE, THYROIDAB in the last 72 hours. Anemia Panel: No results for input(s): VITAMINB12, FOLATE, FERRITIN, TIBC, IRON, RETICCTPCT in the last 72 hours. Urine analysis:    Component Value Date/Time   COLORURINE YELLOW 10/19/2019 1609   APPEARANCEUR CLEAR 10/19/2019 1609   LABSPEC 1.011 10/19/2019 1609   PHURINE 5.0 10/19/2019 1609   GLUCOSEU NEGATIVE 10/19/2019 1609   HGBUR NEGATIVE 10/19/2019 1609   BILIRUBINUR NEGATIVE 10/19/2019 [place] 10/19/2019 1609   PROTEINUR NEGATIVE 10/19/2019 1609   NITRITE NEGATIVE 10/19/2019 1609   LEUKOCYTESUR NEGATIVE 10/19/2019 1609    Verneita Griffes, MD [facility] 11:20 AM

## 2019-10-23 NOTE — Progress Notes (Signed)
Patient is in the hospital for pinning of fractured hip he was rescheduled to Wednesday November 4th.per Dr Tammi Klippel

## 2019-10-23 NOTE — Plan of Care (Signed)
  Problem: Education: Goal: Knowledge of General Education information will improve Description Including pain rating scale, medication(s)/side effects and non-pharmacologic comfort measures Outcome: Progressing   

## 2019-10-23 NOTE — Progress Notes (Signed)
PT Cancellation Note  Patient Details Name: GIANKARLO LEAMER MRN: [id number] DOB: [birth year]   Cancelled Treatment:    Reason Eval/Treat Not Completed: Patient declined, no reason specified. Pt in bed upon PT arrival, request PT return later due to fatigue. PT will continue to follow and evaluate as time/schedule allows.   Mickey Farber, PT, DPT   Acute Rehabilitation Department [phone]    Otho Bellows 10/23/2019, 10:32 AM

## 2019-10-23 NOTE — Care Management Important Message (Signed)
Important Message  Patient Details  Name: Anthony Escobar MRN: [id number] Date of Birth: 1951/09/07   Medicare Important Message Given:  Yes     Memory Argue 10/23/2019, 1:59 PM

## 2019-10-23 NOTE — NC FL2 (Signed)
[place] LEVEL OF CARE SCREENING TOOL     IDENTIFICATION  Patient Name: Anthony Escobar Birthdate: [birth year] Sex: male Admission Date (Current Location): 10/19/2019  [facility] and [place] Number:  Herbalist and Address:  [facility] [place]. [facility], [employer] [address], [place], [place] [address]      Provider Number: [id number]  Attending Physician Name and Address:  Nita Sells, MD  Relative Name and Phone Number:       Current Level of Care: Hospital Recommended Level of Care: [place] Prior Approval Number:    Date Approved/Denied:   PASRR Number: [id number]  Discharge Plan: SNF    Current Diagnoses: Patient Active Problem List   Diagnosis Date Noted  . Hyperkalemia 10/19/2019  . Hyponatremia 10/19/2019  . Pathologic intertrochanteric fracture, right, initial encounter ([place]) 10/17/2019  . Acute bronchitis 12/15/2018  . Encounter for antineoplastic chemotherapy 04/27/2018  . Prostate cancer ([place]) 01/19/2018  . Multiple pulmonary nodules 10/28/2017  . COPD (chronic obstructive pulmonary disease) ([place]) 10/18/2017  . Tobacco use 10/18/2017    Orientation RESPIRATION BLADDER Height & Weight     Self, Time, Situation, Place  Normal, O2(intermittant nasal canula) Continent Weight: 157 lb (71.2 kg) Height:  5\' 8"  (172.7 cm)  BEHAVIORAL SYMPTOMS/MOOD NEUROLOGICAL BOWEL NUTRITION STATUS      Continent Diet(see discharge summary)  AMBULATORY STATUS COMMUNICATION OF NEEDS Skin   Extensive Assist Verbally Surgical wounds(incision on right leg with gauze dressing)                       Personal Care Assistance Level of Assistance  Feeding, Bathing, Dressing Bathing Assistance: Maximum assistance Feeding assistance: Independent Dressing Assistance: Maximum assistance     Functional Limitations Info  Sight, Hearing, Speech Sight Info: Adequate Hearing Info: Adequate Speech Info: Adequate    SPECIAL  CARE FACTORS FREQUENCY  OT (By licensed OT), PT (By licensed PT)     PT Frequency: 5x week OT Frequency: 5x week            Contractures Contractures Info: Not present    Additional Factors Info  Code Status, Allergies, Psychotropic Code Status Info: Full Code Allergies Info: No Known Allergies Psychotropic Info: amitriptyline (ELAVIL) tablet 75 mg daily at bedtime         Current Medications (10/23/2019):  This is the current hospital active medication list Current Facility-Administered Medications  Medication Dose Route Frequency Provider Last Rate Last Dose  . acetaminophen (TYLENOL) tablet 325-650 mg  325-650 mg Oral Q6H PRN Leandrew Koyanagi, MD      . acetaminophen (TYLENOL) tablet 500 mg  500 mg Oral Q6H Leandrew Koyanagi, MD   500 mg at 10/23/19 1308  . albuterol (PROVENTIL) (2.5 MG/3ML) 0.083% nebulizer solution 3 mL  3 mL Inhalation Q4H PRN Leandrew Koyanagi, MD      . alum & mag hydroxide-simeth (MAALOX/MYLANTA) 200-200-20 MG/5ML suspension 30 mL  30 mL Oral Q4H PRN Leandrew Koyanagi, MD      . amitriptyline (ELAVIL) tablet 75 mg  75 mg Oral QHS Leandrew Koyanagi, MD   75 mg at 10/22/19 2119  . atorvastatin (LIPITOR) tablet 20 mg  20 mg Oral Daily Leandrew Koyanagi, MD   20 mg at 10/23/19 0826  . bisacodyl (DULCOLAX) EC tablet 10 mg  10 mg Oral Daily Leandrew Koyanagi, MD   10 mg at 10/23/19 0826  . docusate sodium (COLACE) capsule 100 mg  100 mg Oral BID Leandrew Koyanagi, MD   100 mg at 10/23/19 7867  . enoxaparin (LOVENOX) injection 40 mg  40 mg Subcutaneous Q24H Leandrew Koyanagi, MD   40 mg at 10/23/19 0827  . enzalutamide (XTANDI) capsule 160 mg  160 mg Oral Daily Leandrew Koyanagi, MD   160 mg at 10/22/19 2036  . HYDROcodone-acetaminophen (NORCO) 7.5-325 MG per tablet 1-2 tablet  1-2 tablet Oral Q4H PRN Leandrew Koyanagi, MD   2 tablet at 10/23/19 6720  . HYDROcodone-acetaminophen (NORCO/VICODIN) 5-325 MG per tablet 1-2 tablet  1-2 tablet Oral Q4H PRN Leandrew Koyanagi, MD   1 tablet at 10/23/19 1128  .  LORazepam (ATIVAN) injection 0.5 mg  0.5 mg Intravenous Q4H PRN Leandrew Koyanagi, MD      . magnesium citrate solution 1 Bottle  1 Bottle Oral Once PRN Leandrew Koyanagi, MD      . menthol-cetylpyridinium (CEPACOL) lozenge 3 mg  1 lozenge Oral PRN Leandrew Koyanagi, MD       Or  . phenol (CHLORASEPTIC) mouth spray 1 spray  1 spray Mouth/Throat PRN Leandrew Koyanagi, MD      . methocarbamol (ROBAXIN) tablet 500 mg  500 mg Oral Q6H PRN Leandrew Koyanagi, MD       Or  . methocarbamol (ROBAXIN) 500 mg in dextrose 5 % 50 mL IVPB  500 mg Intravenous Q6H PRN Leandrew Koyanagi, MD      . mometasone-formoterol [facility]) 200-5 MCG/ACT inhaler 2 puff  2 puff Inhalation BID Leandrew Koyanagi, MD   2 puff at 10/23/19 0757  . morphine 2 MG/ML injection 1 mg  1 mg Intravenous Q2H PRN Leandrew Koyanagi, MD      . naloxegol oxalate (MOVANTIK) tablet 12.5 mg  12.5 mg Oral Daily Leandrew Koyanagi, MD   12.5 mg at 10/23/19 0851  . ondansetron (ZOFRAN) tablet 4 mg  4 mg Oral Q6H PRN Leandrew Koyanagi, MD       Or  . ondansetron [facility]) injection 4 mg  4 mg Intravenous Q6H PRN Leandrew Koyanagi, MD      . polyethylene glycol (MIRALAX / GLYCOLAX) packet 17 g  17 g Oral Daily Leandrew Koyanagi, MD   17 g at 10/23/19 9470  . polyethylene glycol (MIRALAX / GLYCOLAX) packet 17 g  17 g Oral Daily PRN Leandrew Koyanagi, MD      . sorbitol 70 % solution 30 mL  30 mL Oral Daily PRN Leandrew Koyanagi, MD   30 mL at 10/23/19 1359     Discharge Medications: Please see discharge summary for a list of discharge medications.  Relevant Imaging Results:  Relevant Lab Results:   Additional Information SS# [id number]; pt has his own cancer medications that he can bring with him  Alexander Mt, LCSWA

## 2019-10-23 NOTE — Progress Notes (Signed)
Subjective: 1 Day Post-Op Procedure(s) (LRB): RIGHT INTERTROCHANTERIC INTRAMEDULLARY (IM) NAIL (Right) Patient reports pain as moderate.    Objective: Vital signs in last 24 hours: Temp:  [97.6 F (36.4 C)-98.9 F (37.2 C)] 98.3 F (36.8 C) (10/27 0737) Pulse Rate:  [96-120] 96 (10/27 0737) Resp:  [15-29] 18 (10/27 0737) BP: (112-161)/(79-97) 112/79 (10/27 0737) SpO2:  [89 %-97 %] 93 % (10/27 0757)  Intake/Output from previous day: 10/26 0701 - 10/27 0700 In: 3261.1 [P.O.:480; I.V.:2431.1; IV Piggyback:350] Out: 2600 [Urine:2450; Blood:150] Intake/Output this shift: Total I/O In: 240 [P.O.:240] Out: -   Recent Labs    10/21/19 0625 10/22/19 1930 10/23/19 0345  HGB 13.9 10.0* 9.8*   Recent Labs    10/22/19 1930 10/23/19 0345  WBC 14.9* 11.1*  RBC 2.97* 2.88*  HCT 29.6* 29.1*  PLT 405* 425*   Recent Labs    10/22/19 1940 10/23/19 0345  NA 133* 134*  K 4.0 4.2  CL 101 101  CO2 22 24  BUN 21 18  CREATININE 0.75 0.72  GLUCOSE 134* 131*  CALCIUM 8.5* 8.3*   No results for input(s): LABPT, INR in the last 72 hours.  Neurologically intact Neurovascular intact Sensation intact distally Intact pulses distally Dorsiflexion/Plantar flexion intact Incision: scant drainage No cellulitis present Compartment soft   Assessment/Plan: 1 Day Post-Op Procedure(s) (LRB): RIGHT INTERTROCHANTERIC INTRAMEDULLARY (IM) NAIL (Right) Up with therapy WBAT RLE DVT ppx-Lovenox x 2 weeks ABLA- mild and stable F/u with Dr. Erlinda Hong 2 weeks post-op for suture removal     Aundra Dubin 10/23/2019, 10:05 AM

## 2019-10-23 NOTE — Progress Notes (Signed)
OT Cancellation Note  Patient Details Name: Anthony Escobar MRN: [id number] DOB: 03-14-1951   Cancelled Treatment:    Reason Eval/Treat Not Completed: Pain limiting ability to participate;Fatigue/lethargy limiting ability to participate;Other (comment), Pt requested OT return tomorrow and that he was tired from sitting up with PT recently and has discomfort in his abdomen. Pt reports that he felt SOB when he sat up with PT earlier  Britt Bottom 10/23/2019, 2:59 PM

## 2019-10-23 NOTE — Evaluation (Addendum)
Physical Therapy Evaluation Patient Details Name: Anthony Escobar MRN: [id number] DOB: 19-Apr-1951 Today's Date: 10/23/2019   History of Present Illness  Pt is a 68 yo male presenting s/p IM nail placement 10/26 for a R intertrochanteric hip fx. PMH sig for prostate cancer with bone mets, s/p 6 cycles of chemo in may 2019 with plans to begin radiation therapy 1 wk followig surgery.  Clinical Impression  Pt in bed and agreeable to PT upon PT arrival. Pt demonstrated some difficulties with RLE mobility in bed, but was most limited throughout session by discomfort due to distended abdomen. Pt reports that sitting upright causes his abdomen to put pressure on his chest making him SOB, and upon reaching sitting EOB, pt became SOB requiring assistance from RN to administer inhaler (3 pumps), and increase of O2 to 2.5L. Pt was then laid back in bed with assist of the RN. Recommend continued skilled PT to assess mobility, and functional independence following hospital stay.     Follow Up Recommendations SNF;Supervision/Assistance - 24 hour    Equipment Recommendations  Rolling walker with 5" wheels;3in1 (PT)    Recommendations for Other Services       Precautions / Restrictions Precautions Precautions: None Restrictions Weight Bearing Restrictions: Yes RLE Weight Bearing: Weight bearing as tolerated      Mobility  Bed Mobility Overal bed mobility: Needs Assistance Bed Mobility: Rolling;Supine to Sit;Sit to Supine Rolling: Mod assist;+2 for physical assistance(due to pain in abdomen)   Supine to sit: Mod assist;HOB elevated Sit to supine: Mod assist;+2 for physical assistance   General bed mobility comments: modA fo 2 for assist with trunk and management of RLE, pt also in sig pain both from abdomen and RLE and therefore was unable to assist with mobility  Transfers Overall transfer level: Needs assistance               General transfer comment: Unable to assess at this date  due to pt distress and SOB while sitting EOB  Ambulation/Gait             General Gait Details: unable to assess  Stairs            Wheelchair Mobility    Modified Rankin (Stroke Patients Only)       Balance Overall balance assessment: Needs assistance Sitting-balance support: Bilateral upper extremity supported;Feet supported Sitting balance-Leahy Scale: Poor Sitting balance - Comments: Pt leans post due to discomfort in abdomen, claims it makes him unable to breathe due to pressure on chest Postural control: Posterior lean                                   Pertinent Vitals/Pain Pain Assessment: 0-10 Pain Score: 7  Pain Location: RLE and stomach Pain Descriptors / Indicators: Sore Pain Intervention(s): Premedicated before session    Home Living Family/patient expects to be discharged to:: Private residence Living Arrangements: Alone Available Help at Discharge: Family(grandaughter lives in apt over garage at Pts house. Granddaughter works at [employer] long) Type of [place] Access: Stairs to enter Entrance Stairs-Rails: Building surveyor of Steps: 3-4 Home Layout: One level [place]: Crutches      Prior Function Level of Independence: Independent with assistive device(s)         Comments: Pt reports amb with crutches prior to admission, but was driving and such prior to admission     Hand Dominance  Dominant Hand: Right    Extremity/Trunk Assessment   Upper Extremity Assessment Upper Extremity Assessment: Overall WFL for tasks assessed    Lower Extremity Assessment Lower Extremity Assessment: Difficult to assess due to impaired cognition(Difficult to assess fully due to breathing difficulties and resulting shortened session, also sig pain with movement of RLE)    Cervical / Trunk Assessment Cervical / Trunk Assessment: Other exceptions(Pt abdomen is large, tight, and very uncomfortable, major barrier to  PT at this time)  Communication   Communication: No difficulties  Cognition Arousal/Alertness: Awake/alert Behavior During Therapy: WFL for tasks assessed/performed Overall Cognitive Status: Within Functional Limits for tasks assessed                                 General Comments: Pt benefits from additional VCs for sequencing of common tasks (moving to EOB)      General Comments      Exercises     Assessment/Plan    PT Assessment Patient needs continued PT services  PT Problem List Decreased strength;Decreased mobility;Decreased safety awareness;Decreased activity tolerance;Cardiopulmonary status limiting activity;Decreased balance;Decreased knowledge of use of DME;Pain       PT Treatment Interventions Gait training;Therapeutic exercise;Balance training;Stair training;Therapeutic activities;Functional mobility training;Patient/family education    PT Goals (Current goals can be found in the Care Plan section)  Acute Rehab PT Goals Patient Stated Goal: to return home PT Goal Formulation: With patient Time For Goal Achievement: 11/06/19 Potential to Achieve Goals: Good    Frequency Min 3X/week   Barriers to discharge        Co-evaluation               AM-PAC PT "6 Clicks" Mobility  Outcome Measure Help needed turning from your back to your side while in a flat bed without using bedrails?: A Lot Help needed moving from lying on your back to sitting on the side of a flat bed without using bedrails?: A Lot Help needed moving to and from a bed to a chair (including a wheelchair)?: A Lot Help needed standing up from a chair using your arms (e.g., wheelchair or bedside chair)?: A Lot Help needed to walk in hospital room?: A Lot Help needed climbing 3-5 steps with a railing? : Total 6 Click Score: 11    End of Session   Activity Tolerance: Treatment limited secondary to medical complications (Comment)(Pt became SOB while EOB, requiring assist of RN  and 3 pumps of inhalor. Pt was then returned to supine to catch his breath.) Patient left: in bed;with nursing/sitter in room Nurse Communication: Mobility status PT Visit Diagnosis: Unsteadiness on feet (R26.81);Other abnormalities of gait and mobility (R26.89);Difficulty in walking, not elsewhere classified (R26.2);Pain Pain - Right/Left: Right Pain - part of body: Hip    Time: 1209-1230 PT Time Calculation (min) (ACUTE ONLY): 21 min   Charges:   PT Evaluation $PT Eval Moderate Complexity: 1 Mod         Mickey Farber, PT, DPT   Acute Rehabilitation Department [phone]  Otho Bellows 10/23/2019, 1:46 PM   Addendum due to error in charges. Above note shows accurate charges for today's evaluation.   Mickey Farber, PT, DPT   Acute Rehabilitation Department [phone]

## 2019-10-23 NOTE — Anesthesia Postprocedure Evaluation (Signed)
Anesthesia Post Note  Patient: Anthony Escobar  Procedure(s) Performed: RIGHT INTERTROCHANTERIC INTRAMEDULLARY (IM) NAIL (Right )     Patient location during evaluation: PACU Anesthesia Type: General Level of consciousness: awake and alert Pain management: pain level controlled Vital Signs Assessment: post-procedure vital signs reviewed and stable Respiratory status: spontaneous breathing, nonlabored ventilation, respiratory function stable and patient connected to nasal cannula oxygen Cardiovascular status: blood pressure returned to baseline and stable Postop Assessment: no apparent nausea or vomiting Anesthetic complications: no    Last Vitals:  Vitals:   10/23/19 0757 10/23/19 1257  BP:  134/84  Pulse:  96  Resp:  18  Temp:  37 C  SpO2: 93% 94%    Last Pain:  Vitals:   10/23/19 1257  TempSrc: Oral  PainSc:                  [name]

## 2019-10-24 ENCOUNTER — Inpatient Hospital Stay (HOSPITAL_COMMUNITY): Payer: Medicare Other

## 2019-10-24 ENCOUNTER — Encounter (HOSPITAL_COMMUNITY): Payer: Self-pay | Admitting: Orthopaedic Surgery

## 2019-10-24 LAB — CBC
HCT: 28.3 % — ABNORMAL LOW (ref 39.0–52.0)
Hemoglobin: 9.7 g/dL — ABNORMAL LOW (ref 13.0–17.0)
MCH: 33.6 pg (ref 26.0–34.0)
MCHC: 34.3 g/dL (ref 30.0–36.0)
MCV: 97.9 fL (ref 80.0–100.0)
Platelets: 422 10*3/uL — ABNORMAL HIGH (ref 150–400)
RBC: 2.89 MIL/uL — ABNORMAL LOW (ref 4.22–5.81)
RDW: 12.3 % (ref 11.5–15.5)
WBC: 10.3 10*3/uL (ref 4.0–10.5)
nRBC: 0 % (ref 0.0–0.2)

## 2019-10-24 LAB — RENAL FUNCTION PANEL
Albumin: 2.2 g/dL — ABNORMAL LOW (ref 3.5–5.0)
Anion gap: 11 (ref 5–15)
BUN: 20 mg/dL (ref 8–23)
CO2: 27 mmol/L (ref 22–32)
Calcium: 8.6 mg/dL — ABNORMAL LOW (ref 8.9–10.3)
Chloride: 98 mmol/L (ref 98–111)
Creatinine, Ser: 0.76 mg/dL (ref 0.61–1.24)
GFR calc Af Amer: >60 mL/min (ref >60)
GFR calc non Af Amer: >60 mL/min (ref >60)
Glucose, Bld: 131 mg/dL — ABNORMAL HIGH (ref 70–99)
Phosphorus: 3.3 mg/dL (ref 2.5–4.6)
Potassium: 4.4 mmol/L (ref 3.5–5.1)
Sodium: 136 mmol/L (ref 135–145)

## 2019-10-24 MED ORDER — SODIUM CHLORIDE 0.9 % IV SOLN
INTRAVENOUS | Status: DC
  Administered 2019-10-24 – 2019-10-25 (×2): via INTRAVENOUS

## 2019-10-24 MED ORDER — IOHEXOL 300 MG/ML  SOLN
100.0000 mL | Freq: Once | INTRAMUSCULAR | Status: AC | PRN
  Administered 2019-10-24: 100 mL via INTRAVENOUS

## 2019-10-24 MED ORDER — HYDRALAZINE HCL 20 MG/ML IJ SOLN: 5.0000 mg | Freq: Four times a day (QID) | INTRAMUSCULAR | Status: DC | PRN

## 2019-10-24 MED ORDER — DIPHENHYDRAMINE HCL 50 MG/ML IJ SOLN
25.0000 mg | Freq: Every evening | INTRAMUSCULAR | Status: DC | PRN
  Administered 2019-10-24: 25 mg via INTRAVENOUS
  Filled 2019-10-24: qty 1

## 2019-10-24 MED ORDER — BISACODYL 10 MG RE SUPP
10.0000 mg | Freq: Once | RECTAL | Status: AC
  Administered 2019-10-24: 10 mg via RECTAL
  Filled 2019-10-24: qty 1

## 2019-10-24 MED ORDER — DIATRIZOATE MEGLUMINE & SODIUM 66-10 % PO SOLN
90.0000 mL | Freq: Once | ORAL | Status: AC
  Administered 2019-10-24: 90 mL via NASOGASTRIC
  Filled 2019-10-24: qty 90

## 2019-10-24 MED ORDER — PIPERACILLIN-TAZOBACTAM 3.375 G IVPB
3.3750 g | Freq: Three times a day (TID) | INTRAVENOUS | Status: DC
  Administered 2019-10-24 – 2019-10-26 (×6): 3.375 g via INTRAVENOUS
  Filled 2019-10-24 (×6): qty 50

## 2019-10-24 NOTE — Progress Notes (Signed)
Pharmacy Antibiotic Note  Anthony Escobar is a 68 y.o. male admitted on 10/19/2019 with intra-abdominal infection.  Pharmacy has been consulted for Zosyn dosing.  Plan: Zosyn 3.375 grams iv Q 8 hours - 4 hr infusion  Pharmacy to sign off  Height: 5\' 8"  (172.7 cm) Weight: 157 lb (71.2 kg) IBW/kg (Calculated) : 68.4  Temp (24hrs), Avg:98.5 F (36.9 C), Min:98.4 F (36.9 C), Max:98.6 F (37 C)  Recent Labs  Lab 10/19/19 1300 10/20/19 0924 10/21/19 0625 10/22/19 1930 10/22/19 1940 10/23/19 0345 10/24/19 0243  WBC 18.4* 14.1* 14.9* 14.9*  --  11.1* 10.3  CREATININE 1.08  --  1.14  --  0.75 0.72 0.76    Estimated Creatinine Clearance: 85.5 mL/min (by C-G formula based on SCr of 0.76 mg/dL).    No Known Allergies  Thank you for allowing pharmacy to be a part of this patient's care. Anette Guarneri, PharmD  10/24/2019 1:44 PM

## 2019-10-24 NOTE — Consult Note (Addendum)
[facility] Surgery Consult Note  Anthony Escobar 04/05/1951  [id number].    Requesting MD: Domenic Polite Chief Complaint/Reason for Consult: SBO  HPI:  Anthony Escobar is a 68yo male PMH HTN, HLD, tobacco abuse, and metastatic prostate cancer on Lupron and Xtandi after completing chemotherapy 04/2018, who was admitted to [facility] 10/19/2019 with a pathological intertrochanteric fracture of the right hip. He underwent open treatment with intramedullary implant 10/22/2019 by Dr. Erlinda Hong. General surgery called today to evaluate abdominal pain/distension. Patient states that he has had intermittent abdominal pain and distention for 1-2 weeks. It has progressively gotten worse since Friday 10/19/19. Pain is diffuse. He has had multiple episodes of n/v since Friday, and he has been unable to tolerate anything but sips of liquids since Saturday. States that he has not had a good/normal BM in about 1 month, and his last small bowel movement was 2 weeks ago. He is passing some flatus. Patient has been taking dulcolax at home with no benefit. No prior history of SBO. Due to worsening n/v over night a CT scan was obtained today that shows multiple air and fluid-filled dilated small bowel loops with possible transition point over the right lower quadrant over the ileum where there is a thick-walled ileal loop; patchy free fluid in the abdomen/pelvis; likely due to SBO; Air and stool present throughout the colon.  Abdominal surgical history: bilateral inguinal hernia repairs each twice, umbilical hernia repair Smokes about 10 cigarettes per day Lives at home with his granddaughter  ROS: Review of Systems  Constitutional: Negative.   HENT: Negative.   Eyes: Negative.   Respiratory: Negative.   Cardiovascular: Positive for leg swelling.  Gastrointestinal: Positive for abdominal pain, constipation, nausea and vomiting.  Genitourinary: Negative.   Musculoskeletal: Positive for joint pain.  Skin: Negative.    Neurological: Positive for tingling.   All systems reviewed and otherwise negative except for as above  Family History  Problem Relation Age of Onset  . Stroke Mother   . Cancer Father        esophageal    Past Medical History:  Diagnosis Date  . Cancer ([place])   . Hyperlipidemia   . Hypertension     Past Surgical History:  Procedure Laterality Date  . HERNIA REPAIR  1988   bilateral inguinal  . INTRAMEDULLARY (IM) NAIL INTERTROCHANTERIC Right 10/22/2019   Procedure: RIGHT INTERTROCHANTERIC INTRAMEDULLARY (IM) NAIL;  Surgeon: Leandrew Koyanagi, MD;  Location: [place] [facility];  Service: Orthopedics;  Laterality: Right;    Social History:  reports that he quit smoking about 8 years ago. His smoking use included cigarettes. He has a 75.00 pack-year smoking history. He has never used smokeless tobacco. He reports current alcohol use. He reports that he does not use drugs.  Allergies: No Known Allergies  Medications Prior to Admission  Medication Sig Dispense Refill  . amitriptyline (ELAVIL) 75 MG tablet Take 75 mg by mouth at bedtime.    . Coenzyme Q10 (COQ10 PO) Take 1 tablet by mouth daily.    Marland Kitchen lisinopril (ZESTRIL) 20 MG tablet Take 20 mg by mouth daily.    . naproxen sodium (ALEVE) 220 MG tablet Take 440 mg by mouth 3 (three) times daily as needed (pain/headache).    . oxyCODONE-acetaminophen (PERCOCET) 5-325 MG tablet Take 1-2 tablets by mouth every 8 (eight) hours as needed for severe pain. 30 tablet 0  . Tiotropium Bromide Monohydrate (SPIRIVA RESPIMAT) 2.5 MCG/ACT AERS Inhale 2 puffs into the lungs daily. 2 Inhaler 0  .  TURMERIC PO Take 2 tablets by mouth daily.    Gillermina Phy 40 MG capsule Take 4 capsules (160 mg total) by mouth daily. 120 capsule 0  . albuterol (PROAIR HFA) 108 (90 Base) MCG/ACT inhaler Inhale 2 puffs into the lungs every 4 (four) hours as needed for wheezing or shortness of breath. 1 Inhaler 5  . atorvastatin (LIPITOR) 20 MG tablet Take 20 mg by mouth daily.    .  budesonide-formoterol (SYMBICORT) 160-4.5 MCG/ACT inhaler Inhale 2 puffs into the lungs 2 (two) times daily.    Marland Kitchen HYDROcodone-acetaminophen (NORCO) 5-325 MG tablet 1/2 to 1 tablet every 6 hours as needed for pain (Patient not taking: Reported on 10/19/2019) 15 tablet 0  . prochlorperazine (COMPAZINE) 10 MG tablet Take 1 tablet (10 mg total) by mouth every 6 (six) hours as needed for nausea or vomiting. (Patient not taking: Reported on 10/19/2019) 30 tablet 0    Prior to Admission medications   Medication Sig Start Date End Date Taking? Authorizing Provider  amitriptyline (ELAVIL) 75 MG tablet Take 75 mg by mouth at bedtime.   Yes [provider]  Coenzyme Q10 (COQ10 PO) Take 1 tablet by mouth daily.   Yes [provider]  lisinopril (ZESTRIL) 20 MG tablet Take 20 mg by mouth daily.   Yes [provider]  naproxen sodium (ALEVE) 220 MG tablet Take 440 mg by mouth 3 (three) times daily as needed (pain/headache).   Yes [provider]  oxyCODONE-acetaminophen (PERCOCET) 5-325 MG tablet Take 1-2 tablets by mouth every 8 (eight) hours as needed for severe pain. 10/17/19  Yes Leandrew Koyanagi, MD  Tiotropium Bromide Monohydrate (SPIRIVA RESPIMAT) 2.5 MCG/ACT AERS Inhale 2 puffs into the lungs daily. 12/15/18  Yes Collene Gobble, MD  TURMERIC PO Take 2 tablets by mouth daily.   Yes [provider]  XTANDI 40 MG capsule Take 4 capsules (160 mg total) by mouth daily. 10/02/19  Yes [name], MD  albuterol (PROAIR HFA) 108 (90 Base) MCG/ACT inhaler Inhale 2 puffs into the lungs every 4 (four) hours as needed for wheezing or shortness of breath. 03/14/18   Collene Gobble, MD  atorvastatin (LIPITOR) 20 MG tablet Take 20 mg by mouth daily.    [provider]  budesonide-formoterol (SYMBICORT) 160-4.5 MCG/ACT inhaler Inhale 2 puffs into the lungs 2 (two) times daily.    [provider]  enoxaparin (LOVENOX) 40 MG/0.4ML injection Inject 0.4 mLs (40  mg total) into the skin daily. 10/22/19   Leandrew Koyanagi, MD  HYDROcodone-acetaminophen [facility]) 5-325 MG tablet 1/2 to 1 tablet every 6 hours as needed for pain Patient not taking: Reported on 10/19/2019 10/15/19   Harle Stanford., PA-C  oxyCODONE-acetaminophen (PERCOCET) 5-325 MG tablet Take 1-2 tablets by mouth every 8 (eight) hours as needed for severe pain. 10/22/19   Leandrew Koyanagi, MD  prochlorperazine (COMPAZINE) 10 MG tablet Take 1 tablet (10 mg total) by mouth every 6 (six) hours as needed for nausea or vomiting. Patient not taking: Reported on 10/19/2019 01/19/18   [name], MD    Blood pressure (!) 143/85, pulse (!) 111, temperature 97.9 F (36.6 C), temperature source Oral, resp. rate 14, height 5\' 8"  (1.727 m), weight 71.2 kg, SpO2 96 %. Physical Exam: General: pleasant, WD/WN white male who is laying in bed in NAD but appears very uncomfortable HEENT: head is normocephalic, atraumatic.  Sclera are noninjected.  Pupils equal and round.  Ears and nose without any  masses or lesions.  Mouth is pink and moist. Dentition fair Heart: tachycardic.  No obvious murmurs, gallops, or rubs noted.  Palpable pedal pulses bilaterally Lungs: trace expiratory wheezes bilaterally, no rhonchi or rales noted.  Respiratory effort nonlabored Abd: very distended but soft, mild diffuse tenderness, hypoactive BS, no masses, hernias, or organomegaly MS: calves soft and nontender. cdi dressings to RLE Skin: warm and dry with no masses, lesions, or rashes Psych: A&Ox3 with an appropriate affect. Neuro: cranial nerves grossly intact, extremity CSM intact bilaterally, normal speech  Results for orders placed or performed during the hospital encounter of 10/19/19 (from the past 48 hour(s))  Surgical pathology     Status: None   Collection Time: 10/22/19  3:44 PM  Result Value Ref Range   SURGICAL PATHOLOGY      SURGICAL PATHOLOGY CASE: [id number] PATIENT: Loistine Chance Surgical Pathology  Report     Clinical History: right intertrochanteric pathologic fracture (cm)     FINAL MICROSCOPIC DIAGNOSIS:  A. BONE, RIGHT FEMUR NECK, REAMINGS: Metastatic poorly differentiated carcinoma. See comment.  COMMENT: Immunohistochemistry will be performed and reported as an addendum.   GROSS DESCRIPTION:  The specimen is received fresh, and consists of a 4.5 x 3.6 x 1.4 cm aggregate of red-brown clotted blood and bone fragments.  Representative sections are submitted in 1 cassette following decalcification. Craig Staggers 10/24/2019)    Final Diagnosis performed by Claudette Laws, MD.   Electronically signed 10/24/2019 Technical component performed at [facility]. [facility], [place] [address], [place], [place] [address].  Professional component performed at [facility], [place] [address], [place], [place] [address].  Immunohistochemistry Technical component (if  applicable) was performed at [facility]. [address], [place], [place], [place] [address].   IMMUNOHISTOCHEMISTRY DISCLAIMER (if applicable): Some of these immunohistochemical stains may have been developed and the performance characteristics determine by [facility]. Some may not have been cleared or approved by the U.S. Food and Drug Administration. The FDA has determined that such clearance or approval is not necessary. This test is used for clinical purposes. It should not be regarded as investigational or for research. This laboratory is certified under the [place] (CLIA-88) as qualified to perform high complexity clinical laboratory testing.  The controls stained appropriately.   CBC     Status: Abnormal   Collection Time: 10/22/19  7:30 PM  Result Value Ref Range   WBC 14.9 (H) 4.0 - 10.5 K/uL   RBC 2.97 (L) 4.22 - 5.81 MIL/uL   Hemoglobin 10.0 (L) 13.0 - 17.0 g/dL    Comment: REPEATED TO VERIFY   HCT 29.6 (L)  39.0 - 52.0 %   MCV 99.7 80.0 - 100.0 fL   MCH 33.7 26.0 - 34.0 pg   MCHC 33.8 30.0 - 36.0 g/dL   RDW 12.5 11.5 - 15.5 %   Platelets 405 (H) 150 - 400 K/uL   nRBC 0.0 0.0 - 0.2 %    Comment: Performed at [facility] [address], [place],  [address]  Basic metabolic panel     Status: Abnormal   Collection Time: 10/22/19  7:40 PM  Result Value Ref Range   Sodium 133 (L) 135 - 145 mmol/L   Potassium 4.0 3.5 - 5.1 mmol/L   Chloride 101 98 - 111 mmol/L   CO2 22 22 - 32 mmol/L   Glucose, Bld 134 (H) 70 - 99 mg/dL   BUN 21 8 -  23 mg/dL   Creatinine, Ser 0.75 0.61 - 1.24 mg/dL   Calcium 8.5 (L) 8.9 - 10.3 mg/dL   GFR calc non Af Amer >60 >60 mL/min   GFR calc Af Amer >60 >60 mL/min   Anion gap 10 5 - 15    Comment: Performed at [facility] [address] [place], [place] [address]  CBC with Differential/Platelet     Status: Abnormal   Collection Time: 10/23/19  3:45 AM  Result Value Ref Range   WBC 11.1 (H) 4.0 - 10.5 K/uL   RBC 2.88 (L) 4.22 - 5.81 MIL/uL   Hemoglobin 9.8 (L) 13.0 - 17.0 g/dL   HCT 29.1 (L) 39.0 - 52.0 %   MCV 101.0 (H) 80.0 - 100.0 fL   MCH 34.0 26.0 - 34.0 pg   MCHC 33.7 30.0 - 36.0 g/dL   RDW 12.5 11.5 - 15.5 %   Platelets 425 (H) 150 - 400 K/uL   nRBC 0.0 0.0 - 0.2 %   Neutrophils Relative % 83 %   Neutro Abs 9.1 (H) 1.7 - 7.7 K/uL   Lymphocytes Relative 5 %   Lymphs Abs 0.6 (L) 0.7 - 4.0 K/uL   Monocytes Relative 12 %   Monocytes Absolute 1.3 (H) 0.1 - 1.0 K/uL   Eosinophils Relative 0 %   Eosinophils Absolute 0.0 0.0 - 0.5 K/uL   Basophils Relative 0 %   Basophils Absolute 0.0 0.0 - 0.1 K/uL   Immature Granulocytes 0 %   Abs Immature Granulocytes 0.03 0.00 - 0.07 K/uL    Comment: Performed at [facility], [place] [address], [place], [place] [address]  Renal function panel     Status: Abnormal   Collection Time: 10/23/19  3:45 AM  Result Value Ref Range   Sodium 134 (L) 135 - 145 mmol/L   Potassium 4.2 3.5 - 5.1 mmol/L    Chloride 101 98 - 111 mmol/L   CO2 24 22 - 32 mmol/L   Glucose, Bld 131 (H) 70 - 99 mg/dL   BUN 18 8 - 23 mg/dL   Creatinine, Ser 0.72 0.61 - 1.24 mg/dL   Calcium 8.3 (L) 8.9 - 10.3 mg/dL   Phosphorus 2.3 (L) 2.5 - 4.6 mg/dL   Albumin 2.1 (L) 3.5 - 5.0 g/dL   GFR calc non Af Amer >60 >60 mL/min   GFR calc Af Amer >60 >60 mL/min   Anion gap 9 5 - 15    Comment: Performed at [facility] [address], [place], [place] [address]  CBC     Status: Abnormal   Collection Time: 10/24/19  2:43 AM  Result Value Ref Range   WBC 10.3 4.0 - 10.5 K/uL   RBC 2.89 (L) 4.22 - 5.81 MIL/uL   Hemoglobin 9.7 (L) 13.0 - 17.0 g/dL   HCT 28.3 (L) 39.0 - 52.0 %   MCV 97.9 80.0 - 100.0 fL   MCH 33.6 26.0 - 34.0 pg   MCHC 34.3 30.0 - 36.0 g/dL   RDW 12.3 11.5 - 15.5 %   Platelets 422 (H) 150 - 400 K/uL   nRBC 0.0 0.0 - 0.2 %    Comment: Performed at [facility], [place] [address] [place], [place] [address]  Renal function panel     Status: Abnormal   Collection Time: 10/24/19  2:43 AM  Result Value Ref Range   Sodium 136 135 - 145 mmol/L   Potassium 4.4 3.5 - 5.1  mmol/L   Chloride 98 98 - 111 mmol/L   CO2 27 22 - 32 mmol/L   Glucose, Bld 131 (H) 70 - 99 mg/dL   BUN 20 8 - 23 mg/dL   Creatinine, Ser 0.76 0.61 - 1.24 mg/dL   Calcium 8.6 (L) 8.9 - 10.3 mg/dL   Phosphorus 3.3 2.5 - 4.6 mg/dL   Albumin 2.2 (L) 3.5 - 5.0 g/dL   GFR calc non Af Amer >60 >60 mL/min   GFR calc Af Amer >60 >60 mL/min   Anion gap 11 5 - 15    Comment: Performed at [facility] [address], [place], [place] [address]   Ct Abdomen Pelvis W Contrast  Result Date: 10/24/2019 CLINICAL DATA:  Acute generalized abdominal pain and fever. Postop hip surgery with diffuse abdominal pain, distention and fever. History of prostate cancer. EXAM: CT ABDOMEN AND PELVIS WITH CONTRAST TECHNIQUE: Multidetector CT imaging of the abdomen and pelvis was performed using the standard protocol following bolus  administration of intravenous contrast. CONTRAST:  12mL OMNIPAQUE IOHEXOL 300 MG/ML  SOLN COMPARISON:  02/28/2019, 10/03/2013 FINDINGS: Lower chest: Small bilateral pleural effusions with posterior bibasilar atelectasis right worse than left. Calcified plaque over the left anterior descending coronary artery as well as calcified plaque over the distal descending thoracic aorta. Hepatobiliary: Liver, gallbladder and biliary tree are normal. Pancreas: Normal. Spleen: Normal. Adrenals/Urinary Tract: Adrenal glands are normal. Kidneys are normal in size without hydronephrosis or nephrolithiasis. 1.5 cm cyst over the mid pole left kidney. Several other smaller sub cm bilateral cortical hypodensities too small to characterize but likely cysts. Ureters and bladder are normal. Foley catheter is present within a decompressed bladder. Stomach/Bowel: Stomach is mildly distended. There are multiple air and fluid-filled dilated small bowel loops. Terminal ileum is normal. Mild wall thickening of an ileal loop over the right lower quadrant as this may represent a transition point. Findings may be due to a regional enteritis of infectious or inflammatory nature. Cecum is just right of midline in the mid abdomen. Appendix is normal. Air and stool present throughout the colon. Minimal diverticulosis of the sigmoid colon. Vascular/Lymphatic: Moderate calcified plaque over the abdominal aorta. No adenopathy. Reproductive: Region of the prostate is smaller with 2 cm rounded hypodense focus over the left side of the prostate. Other: Small amount of patchy free fluid within the abdomen/pelvis. No free peritoneal air. Small right inguinal hernia containing only peritoneal fat. Musculoskeletal: Evidence of patient's right hip fracture post internal fixation with intramedullary nail and associated 2 screws bridging the fracture site into the femoral head intact. Postoperative changes in the soft tissues of the right hip compatible  patient's recent surgery. Subtle air-fluid level within the subcutaneous fat adjacent the lateral aspect of the right gluteus maximus likely postoperative change although developing infection is possible. Mild degenerate change of the hips and spine. Mild compression deformity over the superior endplate of L1 new since the previous exam with possible early depression of the superior endplate of L2. IMPRESSION: 1. Multiple air and fluid-filled dilated small bowel loops with possible transition point over the right lower quadrant over the ileum with there is a thick-walled ileal loop. Patchy free fluid in the abdomen/pelvis. Findings likely due to small bowel obstruction with possible regional enteritis involving and ileal loop in the right lower quadrant. Postoperative ileus less likely. 2. Small bilateral pleural effusions with associated bibasilar atelectasis right worse than left. Infection in the right base is possible. 3. Fixation of right hip fracture  with hardware intact. Postoperative changes over the soft tissues of the right hip compatible with patient's recent surgery. Subtle air-fluid level over the soft tissues of the surgical bed likely postoperative although developing infection is possible. 4. Compression deformity involving the superior endplate of L1 new since the previous exam with possible early depression of the superior endplate of L2. 5. Bilateral renal cysts. 6. Prostate gland smaller than seen previously with indeterminate 2 cm hypodense focus along the left side of the prostate in this patient with known prostate cancer. 7.  Diverticulosis of the colon. 8.  Aortic Atherosclerosis (ICD10-I70.0). 9.  Right inguinal hernia containing only peritoneal fat. Electronically Signed   By: Marin Olp M.D.   On: 10/24/2019 10:26   Dg C-arm 1-60 Min  Result Date: 10/22/2019 CLINICAL DATA:  ORIF of right femur fracture. EXAM: RIGHT FEMUR 2 VIEWS; DG C-ARM 1-60 MIN COMPARISON:  Radiographs  10/19/2019. MRI 10/16/2019. FINDINGS: Four spot fluoroscopic images demonstrate ORIF of the pathologic fracture involving the intertrochanteric region of the right femur using a dynamic compression screw and intramedullary rod. The hardware is well positioned. There is near anatomic reduction of the fracture. No demonstrated complications. IMPRESSION: Near anatomic reduction of pathologic fracture of the proximal right femur status post ORIF. Electronically Signed   By: Richardean Sale M.D.   On: 10/22/2019 17:30   Dg Femur, Min 2 Views Right  Result Date: 10/22/2019 CLINICAL DATA:  ORIF of right femur fracture. EXAM: RIGHT FEMUR 2 VIEWS; DG C-ARM 1-60 MIN COMPARISON:  Radiographs 10/19/2019. MRI 10/16/2019. FINDINGS: Four spot fluoroscopic images demonstrate ORIF of the pathologic fracture involving the intertrochanteric region of the right femur using a dynamic compression screw and intramedullary rod. The hardware is well positioned. There is near anatomic reduction of the fracture. No demonstrated complications. IMPRESSION: Near anatomic reduction of pathologic fracture of the proximal right femur status post ORIF. Electronically Signed   By: Richardean Sale M.D.   On: 10/22/2019 17:30   Anti-infectives (From admission, onward)   Start     Dose/Rate Route Frequency Ordered Stop   10/24/19 1400  piperacillin-tazobactam (ZOSYN) IVPB 3.375 g     3.375 g 12.5 mL/hr over 240 Minutes Intravenous Every 8 hours 10/24/19 1342     10/23/19 0600  ceFAZolin (ANCEF) IVPB 2g/100 mL premix  Status:  Discontinued     2 g 200 mL/hr over 30 Minutes Intravenous On call to O.R. 10/23/19 0537 10/23/19 0544   10/22/19 2030  ceFAZolin (ANCEF) IVPB 2g/100 mL premix     2 g 200 mL/hr over 30 Minutes Intravenous Every 6 hours 10/22/19 1756 10/23/19 1423   10/22/19 1330  ceFAZolin (ANCEF) IVPB 2g/100 mL premix    Note to Pharmacy: Anesthesia to give preop   2 g 200 mL/hr over 30 Minutes Intravenous  Once 10/21/19 1853  10/22/19 1454        Assessment/Plan HTN HLD Tobacco abuse Metastatic prostate cancer - followed by Dr. Alen Blew, on Lupron and Xtandi after completing chemotherapy 04/2018 Mild compression deformity superior endplate of L1, possible early depression superior endplate L2 Pathological intertrochanteric fracture of the right hip s/p open treatment with intramedullary implant 10/22/2019 by Dr. Erlinda Hong  Abdominal distention, nausea, vomiting SBO vs ileus vs Ogelvie's syndrome - Patient with 1-2 weeks of worsening abdominal pain, distension, nausea, vomiting, and constipation. CT scan shows possible SBO with transition point over the right lower quadrant over the ileum where there is a thick-walled ileal loop. It also shows that she has  air and stool present in the colon suggesting against SBO. Recommend NG tube for decompression. Will start patient on small bowel protocol with gastrograffin and delayed film. Monitor and correct electrolytes. Recommend limiting narcotics. Will follow.  ID - ancef 10/26>>10/27, zosyn 10/28>> VTE - SCDs, lovenox FEN - IVF, NPO/NGT Foley - in place Follow up - TBD  Wellington Hampshire, [facility] 10/24/2019, 2:32 PM Please see Amion for pager number during day hours 7:00am-4:30pm

## 2019-10-24 NOTE — Progress Notes (Signed)
Subjective: 2 Days Post-Op Procedure(s) (LRB): RIGHT INTERTROCHANTERIC INTRAMEDULLARY (IM) NAIL (Right) Patient reports pain as mild.  Main issue has been vomiting greater than 15 times overnight.  Says he is passing gas, but no bm.  zofran and maalox not helping.  Objective: Vital signs in last 24 hours: Temp:  [98.3 F (36.8 C)-98.6 F (37 C)] 98.6 F (37 C) (10/28 0356) Pulse Rate:  [96-116] 116 (10/28 0356) Resp:  [16-18] 16 (10/28 0356) BP: (112-155)/(79-87) 155/87 (10/28 0356) SpO2:  [92 %-96 %] 96 % (10/28 0356)  Intake/Output from previous day: 10/27 0701 - 10/28 0700 In: 240 [P.O.:240] Out: 1300 [Urine:650; Emesis/NG output:650] Intake/Output this shift: No intake/output data recorded.  Recent Labs    10/22/19 1930 10/23/19 0345 10/24/19 0243  HGB 10.0* 9.8* 9.7*   Recent Labs    10/23/19 0345 10/24/19 0243  WBC 11.1* 10.3  RBC 2.88* 2.89*  HCT 29.1* 28.3*  PLT 425* 422*   Recent Labs    10/23/19 0345 10/24/19 0243  NA 134* 136  K 4.2 4.4  CL 101 98  CO2 24 27  BUN 18 20  CREATININE 0.72 0.76  GLUCOSE 131* 131*  CALCIUM 8.3* 8.6*   No results for input(s): LABPT, INR in the last 72 hours.  Neurologically intact Neurovascular intact Sensation intact distally Intact pulses distally Dorsiflexion/Plantar flexion intact Incision: scant drainage No cellulitis present Compartment soft   Assessment/Plan: 2 Days Post-Op Procedure(s) (LRB): RIGHT INTERTROCHANTERIC INTRAMEDULLARY (IM) NAIL (Right) Up with therapy WBAT RLE DVT ppx-Lovenox x 2 weeks ABLA- mild and stable F/u with Dr. Erlinda Hong 2 weeks post-op for suture removal Spoke with nurse about need to call hospitalist as patient appears very uncomfortable, has vomited over 15 times and has a distended stomach     Aundra Dubin 10/24/2019, 7:04 AM

## 2019-10-24 NOTE — Progress Notes (Signed)
PT Cancellation Note  Patient Details Name: Anthony Escobar MRN: [id number] DOB: 30-Oct-1951   Cancelled Treatment:    Reason Eval/Treat Not Completed: Patient declined, no reason specified. Pt reports general discomfort due to distended abdomen and onset of n/v with any mobility. PT will continue to follow and treat as time/schedule allow.   Mickey Farber, PT, DPT   Acute Rehabilitation Department [phone]   Otho Bellows 10/24/2019, 3:17 PM

## 2019-10-24 NOTE — Progress Notes (Signed)
OT Cancellation Note  Patient Details Name: Anthony Escobar MRN: [id number] DOB: 08/04/1951   Cancelled Treatment:    Reason Eval/Treat Not Completed: Pain limiting ability to participate;Other (comment)(Pt N/V each time he moves so pt unable to be seen)  Pt not willing to participate due to pain and N/V. OT to continue to follow-up for OT eval as schedule allows. CT scan from this AM to be read regarding a small bowel obstruction.  [name]) [name] OTR/L Acute Rehabilitation Services Pager: [phone] Office: [place] 10/24/2019, 2:17 PM

## 2019-10-24 NOTE — Progress Notes (Signed)
Pt states he has been vomiting all night, looks dark brown, not thick. Abdomen in distended, hard, no bowel sounds. Pt states he feels like he has heart burn then vomits. Does have gas at times. MD notified of situation. Coming to see patient now.

## 2019-10-24 NOTE — Progress Notes (Addendum)
PROGRESS NOTE    Anthony Escobar  ZTI:[id number] DOB: 1951-05-18 DOA: 10/19/2019 PCP: [name], MD  Brief Narrative: Anthony Escobar is a 68 y.o. 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  He saw Dr. Erlinda Hong  10/17/2019 as MRI revealed pathological  intertrochanteric fracture of the right hip.  He was planning to have active surgical stabilization on Monday, 10/22/2019.  However after lifting a big pot of plant his pain got worse and he decided to come to the ER. He also complained of constipation on admission, -Underwent ORIF by Dr. Erlinda Hong on 10/26 -Subsequently was being treated for constipation by my partners with laxatives, enema etc. -This morning when I saw him for the first time he was having significant abdominal discomfort with nausea vomiting overnight, massive abdominal distention, concern for acute ileus versus SBO, ordered stat CT, n.p.o, . imaging was more concerning for SBO with possible transition point -NG tube ordered to intermittent low wall suction  Assessment & Plan:   Ileus versus small bowel obstruction -Now with nausea vomiting and worsening abdominal distention, was being treated for constipation by my partners for the last couple of days with laxatives, enema etc. -CT abdomen pelvis concerning for possible small bowel obstruction with multiple air-fluid loops, transition point near right lower quadrant, also bowel wall edema noted -Place NG tube to intermittent low wall suction, continue n.p.o., gentle IV fluids, empiric Zosyn today -Stop all p.o. medications, will request general surgery input  Pathologic intertrochanteric fracture, right, initial encounter [facility]) -Status post ORIF 10/26 -Orthopedics following, Lovenox for DVT prophylaxis -Physical  therapy when tolerated  Prostate cancer metastatic to bones and lungs -diagnosed in 2019 completed 6 cycles of Taxotere chemotherapy May 2019 followed by Lupron and Gillermina Phy now with right hip pathological fracture. -Good functional status at baseline, followed closely by Dr. Alen Blew, per last oncology note appears to be responding well to treatment -Hold Xtandi with SBO  Hyponatremia -Improved with hydration  Hyperkalemia -Resolved, ACE inhibitor on hold  COPD -Stable no wheezing, nebs as needed  DVT prophylaxis: Lovenox Code Status: Full code Family Communication: No family at bedside, called and updated granddaughter Disposition Plan: Will need SNF for rehab per grand daughter  Consultants:   CCS  Ortho   Procedures: ORIF by Dr. Erlinda Hong on 10/26  Antimicrobials:    Subjective: -reports having terrible night, a lot of nausea and vomiting, abdominal pain and distention  Objective: Vitals:   10/23/19 2038 10/23/19 2042 10/24/19 0356 10/24/19 0831  BP:   (!) 155/87 (!) 154/94  Pulse:   (!) 116 (!) 118  Resp:   16 17  Temp:   98.6 F (37 C) 98.4 F (36.9 C)  TempSrc:   Oral Oral  SpO2: 95% 96% 96% 95%  Weight:      Height:        Intake/Output Summary (Last 24 hours) at 10/24/2019 1351 Last data filed at 10/24/2019 1100 Gross per 24 hour  Intake 0 ml  Output 1650 ml  Net -1650 ml   Filed Weights   10/19/19 1217  Weight: 71.2 kg    Examination:  General exam: Middle-aged male sitting up in bed, very uncomfortable, eyes closed, oriented to self and place Respiratory system: Decreased breath sounds at both bases Cardiovascular system: S1 & S2 heard, tachycardic  gastrointestinal system: Distended taut, mild diffuse tenderness, minimal bowel sounds Central nervous system: Awake alert oriented x2 Extremities: Right hip with dressing Skin: No rashes, lesions or ulcers Psychiatry: Poor insight and judgment    Data Reviewed:   CBC: Recent Labs  Lab  10/19/19 1300 10/20/19 0924 10/21/19 0625 10/22/19 1930 10/23/19 0345 10/24/19 0243  WBC 18.4* 14.1* 14.9* 14.9* 11.1* 10.3  NEUTROABS 15.9*  --   --   --  9.1*  --   HGB 11.3* 11.7* 13.9 10.0* 9.8* 9.7*  HCT 34.4* 34.8* 41.3 29.6* 29.1* 28.3*  MCV 102.4* 101.8* 100.7* 99.7 101.0* 97.9  PLT 425* 430* 391 405* 425* 235*   Basic Metabolic Panel: Recent Labs  Lab 10/19/19 1300 10/21/19 0625 10/22/19 1940 10/23/19 0345 10/24/19 0243  NA 127* 136 133* 134* 136  K 5.4* 4.4 4.0 4.2 4.4  CL 97* 102 101 101 98  CO2 22 21* 22 24 27   GLUCOSE 108* 102* 134* 131* 131*  BUN 29* 23 21 18 20   CREATININE 1.08 1.14 0.75 0.72 0.76  CALCIUM 9.1 9.4 8.5* 8.3* 8.6*  PHOS  --   --   --  2.3* 3.3   GFR: Estimated Creatinine Clearance: 85.5 mL/min (by C-G formula based on SCr of 0.76 mg/dL). Liver Function Tests: Recent Labs  Lab 10/21/19 0625 10/23/19 0345 10/24/19 0243  AST 43*  --   --   ALT 13  --   --   ALKPHOS 197*  --   --   BILITOT 0.7  --   --   PROT 6.4*  --   --   ALBUMIN 3.0* 2.1* 2.2*   No results for input(s): LIPASE, AMYLASE in the last 168 hours. No results for input(s): AMMONIA in the last 168 hours. Coagulation Profile: No results for input(s): INR, PROTIME in the last 168 hours. Cardiac Enzymes: No results for input(s): CKTOTAL, CKMB, CKMBINDEX, TROPONINI in the last 168 hours. BNP (last 3 results) No results for input(s): PROBNP in the last 8760 hours. HbA1C: No results for input(s): HGBA1C in the last 72 hours. CBG: No results for input(s): GLUCAP in the last 168 hours. Lipid Profile: No results for input(s): CHOL, HDL, LDLCALC, TRIG, CHOLHDL, LDLDIRECT in the last 72 hours. Thyroid Function Tests: No results for input(s): TSH, T4TOTAL, FREET4, T3FREE, THYROIDAB in the last 72 hours. Anemia Panel: No results for input(s): VITAMINB12, FOLATE, FERRITIN, TIBC, IRON, RETICCTPCT in the last 72 hours. Urine analysis:    Component Value Date/Time   COLORURINE  YELLOW 10/19/2019 1609   APPEARANCEUR CLEAR 10/19/2019 1609   LABSPEC 1.011 10/19/2019 1609   PHURINE 5.0 10/19/2019 1609   GLUCOSEU NEGATIVE 10/19/2019 1609   HGBUR NEGATIVE 10/19/2019 1609   BILIRUBINUR NEGATIVE 10/19/2019 1609   KETONESUR NEGATIVE 10/19/2019 1609   PROTEINUR NEGATIVE 10/19/2019 1609   NITRITE NEGATIVE 10/19/2019 [place] 10/19/2019 1609   Sepsis Labs: @LABRCNTIP (procalcitonin:4,lacticidven:4)  ) Recent Results (from the past 240 hour(s))  Novel Coronavirus, NAA (Hosp order, Send-out to Ref Lab; TAT 18-24 hrs     Status: None   Collection Time: 10/18/19 11:12 AM   Specimen: Nasopharyngeal Swab; Respiratory  Result Value Ref Range Status   SARS-CoV-2, NAA NOT DETECTED NOT DETECTED Final    Comment: (NOTE) This nucleic acid amplification test was developed and its performance characteristics determined by [employer]. Nucleic acid amplification tests include PCR and TMA. This test has not been FDA cleared or approved. This test has been authorized by FDA  under an Emergency Use Authorization (EUA). This test is only authorized for the duration of time the declaration that circumstances exist justifying the authorization of the emergency use of in vitro diagnostic tests for detection of SARS-CoV-2 virus and/or diagnosis of COVID-19 infection under section 564(b)(1) of the Act, 21 U.S.C. 973ZHG-9(J) (1), unless the authorization is terminated or revoked sooner. When diagnostic testing is negative, the possibility of a false negative result should be considered in the context of a patient's recent exposures and the presence of clinical signs and symptoms consistent with COVID-19. An individual without symptoms of COVID- 19 and who is not shedding SARS-CoV-2 vi rus would expect to have a negative (not detected) result in this assay. Performed At: [facility] [address] [place], [place] [id number] Rush Farmer MD  QQ:[phone]    [place]  Final    Comment: Performed at [facility], [place] [address] [place], [place] [address]  Surgical pcr screen     Status: None   Collection Time: 10/21/19  5:12 AM   Specimen: Nasal Mucosa; Nasal Swab  Result Value Ref Range Status   MRSA, PCR NEGATIVE NEGATIVE Final   Staphylococcus aureus NEGATIVE NEGATIVE Final    Comment: (NOTE) The Xpert SA Assay (FDA approved for NASAL specimens in patients 63 years of age and older), is one component of a comprehensive surveillance program. It is not intended to diagnose infection nor to guide or monitor treatment. Performed at [facility], [place] [address] [place], [place] [address]          Radiology Studies: Ct Abdomen Pelvis W Contrast  Result Date: 10/24/2019 CLINICAL DATA:  Acute generalized abdominal pain and fever. Postop hip surgery with diffuse abdominal pain, distention and fever. History of prostate cancer. EXAM: CT ABDOMEN AND PELVIS WITH CONTRAST TECHNIQUE: Multidetector CT imaging of the abdomen and pelvis was performed using the standard protocol following bolus administration of intravenous contrast. CONTRAST:  174mL OMNIPAQUE IOHEXOL 300 MG/ML  SOLN COMPARISON:  02/28/2019, 10/03/2013 FINDINGS: Lower chest: Small bilateral pleural effusions with posterior bibasilar atelectasis right worse than left. Calcified plaque over the left anterior descending coronary artery as well as calcified plaque over the distal descending thoracic aorta. Hepatobiliary: Liver, gallbladder and biliary tree are normal. Pancreas: Normal. Spleen: Normal. Adrenals/Urinary Tract: Adrenal glands are normal. Kidneys are normal in size without hydronephrosis or nephrolithiasis. 1.5 cm cyst over the mid pole left kidney. Several other smaller sub cm bilateral cortical hypodensities too small to characterize but likely cysts. Ureters and bladder are normal. Foley catheter is present within a  decompressed bladder. Stomach/Bowel: Stomach is mildly distended. There are multiple air and fluid-filled dilated small bowel loops. Terminal ileum is normal. Mild wall thickening of an ileal loop over the right lower quadrant as this may represent a transition point. Findings may be due to a regional enteritis of infectious or inflammatory nature. Cecum is just right of midline in the mid abdomen. Appendix is normal. Air and stool present throughout the colon. Minimal diverticulosis of the sigmoid colon. Vascular/Lymphatic: Moderate calcified plaque over the abdominal aorta. No adenopathy. Reproductive: Region of the prostate is smaller with 2 cm rounded hypodense focus over the left side of the prostate. Other: Small amount of patchy free fluid within the abdomen/pelvis. No free peritoneal air. Small right inguinal hernia containing only peritoneal fat. Musculoskeletal: Evidence of patient's right hip fracture post internal fixation with intramedullary nail and associated 2 screws bridging the fracture site into the  femoral head intact. Postoperative changes in the soft tissues of the right hip compatible patient's recent surgery. Subtle air-fluid level within the subcutaneous fat adjacent the lateral aspect of the right gluteus maximus likely postoperative change although developing infection is possible. Mild degenerate change of the hips and spine. Mild compression deformity over the superior endplate of L1 new since the previous exam with possible early depression of the superior endplate of L2. IMPRESSION: 1. Multiple air and fluid-filled dilated small bowel loops with possible transition point over the right lower quadrant over the ileum with there is a thick-walled ileal loop. Patchy free fluid in the abdomen/pelvis. Findings likely due to small bowel obstruction with possible regional enteritis involving and ileal loop in the right lower quadrant. Postoperative ileus less likely. 2. Small bilateral pleural  effusions with associated bibasilar atelectasis right worse than left. Infection in the right base is possible. 3. Fixation of right hip fracture with hardware intact. Postoperative changes over the soft tissues of the right hip compatible with patient's recent surgery. Subtle air-fluid level over the soft tissues of the surgical bed likely postoperative although developing infection is possible. 4. Compression deformity involving the superior endplate of L1 new since the previous exam with possible early depression of the superior endplate of L2. 5. Bilateral renal cysts. 6. Prostate gland smaller than seen previously with indeterminate 2 cm hypodense focus along the left side of the prostate in this patient with known prostate cancer. 7.  Diverticulosis of the colon. 8.  Aortic Atherosclerosis (ICD10-I70.0). 9.  Right inguinal hernia containing only peritoneal fat. Electronically Signed   By: Marin Olp M.D.   On: 10/24/2019 10:26   Dg C-arm 1-60 Min  Result Date: 10/22/2019 CLINICAL DATA:  ORIF of right femur fracture. EXAM: RIGHT FEMUR 2 VIEWS; DG C-ARM 1-60 MIN COMPARISON:  Radiographs 10/19/2019. MRI 10/16/2019. FINDINGS: Four spot fluoroscopic images demonstrate ORIF of the pathologic fracture involving the intertrochanteric region of the right femur using a dynamic compression screw and intramedullary rod. The hardware is well positioned. There is near anatomic reduction of the fracture. No demonstrated complications. IMPRESSION: Near anatomic reduction of pathologic fracture of the proximal right femur status post ORIF. Electronically Signed   By: Richardean Sale M.D.   On: 10/22/2019 17:30   Dg Femur, Min 2 Views Right  Result Date: 10/22/2019 CLINICAL DATA:  ORIF of right femur fracture. EXAM: RIGHT FEMUR 2 VIEWS; DG C-ARM 1-60 MIN COMPARISON:  Radiographs 10/19/2019. MRI 10/16/2019. FINDINGS: Four spot fluoroscopic images demonstrate ORIF of the pathologic fracture involving the  intertrochanteric region of the right femur using a dynamic compression screw and intramedullary rod. The hardware is well positioned. There is near anatomic reduction of the fracture. No demonstrated complications. IMPRESSION: Near anatomic reduction of pathologic fracture of the proximal right femur status post ORIF. Electronically Signed   By: Richardean Sale M.D.   On: 10/22/2019 17:30        Scheduled Meds: . Chlorhexidine Gluconate Cloth  6 each Topical Daily  . enoxaparin (LOVENOX) injection  40 mg Subcutaneous Q24H  . mometasone-formoterol  2 puff Inhalation BID   Continuous Infusions: . sodium chloride 75 mL/hr at 10/24/19 0922  . methocarbamol (ROBAXIN) IV    . piperacillin-tazobactam (ZOSYN)  IV       LOS: 5 days    Time spent: 68min    Domenic Polite, MD [facility] Page via [email or web address], password [email or web address] After 7PM please contact night-coverage  10/24/2019, 1:51 PM

## 2019-10-24 NOTE — Progress Notes (Signed)
Dr. Broadus John notified of elevated BP and HR. MD aware of high BP and HR, PRN meds ordered if needed.

## 2019-10-24 NOTE — Plan of Care (Signed)

## 2019-10-24 NOTE — Progress Notes (Signed)
CSW/CM- please contact granddaughter, she is the single caregiver of the patient. Bard Herbert [phone]. Please contact her about SNF placement and care plan.

## 2019-10-25 ENCOUNTER — Telehealth: Payer: Self-pay | Admitting: Pharmacist

## 2019-10-25 ENCOUNTER — Inpatient Hospital Stay (HOSPITAL_COMMUNITY): Payer: Medicare Other

## 2019-10-25 DIAGNOSIS — C61 Malignant neoplasm of prostate: Secondary | ICD-10-CM

## 2019-10-25 LAB — MAGNESIUM: Magnesium: 2 mg/dL (ref 1.7–2.4)

## 2019-10-25 LAB — BASIC METABOLIC PANEL
Anion gap: 12 (ref 5–15)
BUN: 25 mg/dL — ABNORMAL HIGH (ref 8–23)
CO2: 29 mmol/L (ref 22–32)
Calcium: 8.1 mg/dL — ABNORMAL LOW (ref 8.9–10.3)
Chloride: 100 mmol/L (ref 98–111)
Creatinine, Ser: 0.77 mg/dL (ref 0.61–1.24)
GFR calc Af Amer: >60 mL/min (ref >60)
GFR calc non Af Amer: >60 mL/min (ref >60)
Glucose, Bld: 99 mg/dL (ref 70–99)
Potassium: 3.5 mmol/L (ref 3.5–5.1)
Sodium: 141 mmol/L (ref 135–145)

## 2019-10-25 LAB — CBC
HCT: 25 % — ABNORMAL LOW (ref 39.0–52.0)
Hemoglobin: 8.2 g/dL — ABNORMAL LOW (ref 13.0–17.0)
MCH: 33.6 pg (ref 26.0–34.0)
MCHC: 32.8 g/dL (ref 30.0–36.0)
MCV: 102.5 fL — ABNORMAL HIGH (ref 80.0–100.0)
Platelets: 417 10*3/uL — ABNORMAL HIGH (ref 150–400)
RBC: 2.44 MIL/uL — ABNORMAL LOW (ref 4.22–5.81)
RDW: 12.7 % (ref 11.5–15.5)
WBC: 12.3 10*3/uL — ABNORMAL HIGH (ref 4.0–10.5)
nRBC: 0 % (ref 0.0–0.2)

## 2019-10-25 LAB — SURGICAL PATHOLOGY

## 2019-10-25 MED ORDER — FUROSEMIDE 10 MG/ML IJ SOLN
20.0000 mg | Freq: Once | INTRAMUSCULAR | Status: AC
  Administered 2019-10-25: 20 mg via INTRAVENOUS
  Filled 2019-10-25: qty 2

## 2019-10-25 MED ORDER — NALOXEGOL OXALATE 12.5 MG PO TABS
12.5000 mg | ORAL_TABLET | Freq: Every day | ORAL | Status: DC
  Administered 2019-10-25 – 2019-10-29 (×5): 12.5 mg via ORAL
  Filled 2019-10-25 (×6): qty 1

## 2019-10-25 MED ORDER — POLYETHYLENE GLYCOL 3350 17 G PO PACK
17.0000 g | PACK | Freq: Two times a day (BID) | ORAL | Status: DC
  Administered 2019-10-25: 17 g via ORAL
  Filled 2019-10-25 (×3): qty 1

## 2019-10-25 MED ORDER — XTANDI 40 MG PO CAPS: 160.0000 mg | ORAL_CAPSULE | Freq: Every day | ORAL | 2 refills | Status: DC

## 2019-10-25 NOTE — Evaluation (Signed)
Occupational Therapy Evaluation Patient Details Name: Anthony Escobar MRN: [id number] DOB: Jul 15, 1951 Today's Date: 10/25/2019    History of Present Illness Pt is a 68 yo male presenting s/p IM nail placement 10/26 for a R intertrochanteric hip fx. PMH sig for prostate cancer with bone mets, s/p 6 cycles of chemo in may 2019 with plans to begin radiation therapy 1 wk following surgery.   Clinical Impression   Pt with decline in function and safety with ADLs and ADL mobility with impaired strength, balance, endurance. Pt reports that PTA he lived at home alone and his 49 y/o granddaughter Investment banker, corporate) lives in an adjoining apartment. Pt states that he was independent with ADLs/selfcare, home mgt, was driving. Pt currently requires max A with LB ADLs, total A with toileting and mod - min A +2 for mobility suing RW. Pt was able to transfer from multiple surfaces with however, pt with poor tolerance to activity as  desatting to 85% SPO2 during 60ft amb to recliner with 2L O2 in room. Pt became symptomatic with desat, and required ~5 min of seated rest with continued O2 before levels returned to 90s. Pt would benefit from acute OT services to address impairments to maximize level of function and safety   Follow Up Recommendations  SNF(Short Term SNF, if pt refusing SNF then max [facility] services)    Equipment Recommendations  3 in 1 bedside commode;Tub/shower bench;Other (comment)(reacher)    Recommendations for Other Services       Precautions / Restrictions Precautions Precautions: Fall Restrictions Weight Bearing Restrictions: Yes RLE Weight Bearing: Weight bearing as tolerated      Mobility Bed Mobility Overal bed mobility: Needs Assistance Bed Mobility: Rolling;Supine to Sit Rolling: Min guard   Supine to sit: HOB elevated;Min assist     General bed mobility comments: min A for assist getting to EOB, elevated HOB and use of VCs to improve eficciency  Transfers Overall transfer level: Needs  assistance Equipment used: Rolling walker (2 wheeled) Transfers: Sit to/from [facility] Sit to Stand: Min assist Stand pivot transfers: Min assist       General transfer comment: mod A for first sit-stand from EOB with min A + 2 for management of lines and safety during pivot to [facility]. Pt was then able to stand with minA from [facility] and sit in reclincer with minA/supervision.    Balance Overall balance assessment: Needs assistance Sitting-balance support: Feet supported Sitting balance-Leahy Scale: Good Sitting balance - Comments: Pt leans post due to discomfort in abdomen, claims it makes him unable to breathe due to pressure on chest Postural control: Posterior lean Standing balance support: Bilateral upper extremity supported Standing balance-Leahy Scale: Fair                             ADL either performed or assessed with clinical judgement   ADL Overall ADL's : Needs assistance/impaired Eating/Feeding: Independent;Sitting   Grooming: Wash/dry hands;Wash/dry face;Supervision/safety;Set up;Sitting   Upper Body Bathing: Set up;Supervision/ safety;Sitting   Lower Body Bathing: Maximal assistance   Upper Body Dressing : Set up;Supervision/safety;Sitting   Lower Body Dressing: Maximal assistance   Toilet Transfer: Moderate assistance;Minimal assistance;+2 for safety/equipment;Cueing for safety;Cueing for sequencing;Stand-pivot Toilet Transfer Details (indicate cue type and reason): mod A sit - stand from EOB, min A + 2  from [facility] Toileting- Clothing Manipulation and Hygiene: Total assistance Toileting - Clothing Manipulation Details (indicate cue type and reason): total A for hygiene after  BM     Functional mobility during ADLs: Moderate assistance;Minimal assistance;Rolling walker       Vision Patient Visual Report: No change from baseline       Perception     Praxis      Pertinent Vitals/Pain Pain Assessment: Faces Faces Pain Scale:  Hurts little more Pain Location: RLE with mobility Pain Descriptors / Indicators: Grimacing;Sore Pain Intervention(s): Monitored during session;Limited activity within patient's tolerance;Repositioned     Hand Dominance Right   Extremity/Trunk Assessment Upper Extremity Assessment Upper Extremity Assessment: Generalized weakness   Lower Extremity Assessment Lower Extremity Assessment: Defer to PT evaluation       Communication Communication Communication: No difficulties   Cognition Arousal/Alertness: Awake/alert Behavior During Therapy: WFL for tasks assessed/performed;Anxious Overall Cognitive Status: Within Functional Limits for tasks assessed                                 General Comments: Pt benefits from additional VCs for sequencing   General Comments       Exercises     Shoulder Instructions      Home Living Family/patient expects to be discharged to:: Private residence Living Arrangements: Alone Available Help at Discharge: Family Type of Home: House Home Access: Stairs to enter Technical brewer of Steps: 3-4 Entrance Stairs-Rails: Right Home Layout: One level     Bathroom Shower/Tub: Teacher, early years/pre: Standard     Home Equipment: Crutches          Prior Functioning/Environment Level of Independence: Independent with assistive device(s)        Comments: Pt reports amb with crutches prior to admission, but was independent with ADLs/selfcare, home mgt and driving prior to admission        OT Problem List: Decreased strength;Impaired balance (sitting and/or standing);Pain;Decreased activity tolerance;Decreased knowledge of use of DME or AE      OT Treatment/Interventions:      OT Goals(Current goals can be found in the care plan section) Acute Rehab OT Goals Patient Stated Goal: to return home OT Goal Formulation: With patient Time For Goal Achievement: 11/08/19 Potential to Achieve Goals: Good ADL  Goals Pt Will Perform Grooming: with supervision;with set-up;sitting Pt Will Perform Lower Body Bathing: with mod assist Pt Will Perform Lower Body Dressing: with mod assist;with adaptive equipment Pt Will Transfer to Toilet: with min assist;ambulating Pt Will Perform Toileting - Clothing Manipulation and hygiene: with max assist;with mod assist;sit to/from stand  OT Frequency: Min 2X/week   Barriers to D/C: Decreased caregiver support  pt lives at home alone. Has a granddaughter who is an Therapist, sports that works day shift per pt report       Co-evaluation PT/OT/SLP Co-Evaluation/Treatment: Yes Reason for Co-Treatment: For patient/therapist safety;To address functional/ADL transfers PT goals addressed during session: Mobility/safety with mobility;Balance OT goals addressed during session: ADL's and self-care;Proper use of Adaptive equipment and DME      AM-PAC OT "6 Clicks" Daily Activity     Outcome Measure Help from another person eating meals?: None Help from another person taking care of personal grooming?: A Little Help from another person toileting, which includes using toliet, bedpan, or urinal?: Total Help from another person bathing (including washing, rinsing, drying)?: A Lot Help from another person to put on and taking off regular upper body clothing?: A Little Help from another person to put on and taking off regular lower body clothing?: A Lot 6 Click  Score: 15   End of Session Equipment Utilized During Treatment: Gait belt;Rolling walker;Other (comment);Oxygen(BSC)  Activity Tolerance: Patient tolerated treatment well Patient left: in chair;with call bell/phone within reach;with chair alarm set  OT Visit Diagnosis: Unsteadiness on feet (R26.81);Other abnormalities of gait and mobility (R26.89);Muscle weakness (generalized) (M62.81);Pain Pain - Right/Left: Right Pain - part of body: Hip                Time: 1111-1140 OT Time Calculation (min): 29 min Charges:  OT General  Charges $OT Visit: 1 Visit OT Evaluation $OT Eval Moderate Complexity: 1 Mod   Britt Bottom 10/25/2019, 12:53 PM

## 2019-10-25 NOTE — TOC Progression Note (Signed)
Transition of Care [facility]) - Progression Note    Patient Details  Name: Anthony Escobar MRN: [id number] Date of Birth: March 31, 1951  Transition of Care [facility]) CM/SW [place], [place] Phone Number: 10/25/2019, 5:25 PM  Clinical Narrative:     Patient has agreed to [facility]. CSW will provide bed offers once available.  Thurmond Butts, MSW, [facility] Clinical Social Worker [phone]   Expected Discharge Plan: [place] Barriers to Discharge: No Barriers Identified  Expected Discharge Plan and Services Expected Discharge Plan: [place] In-house Referral: Clinical Social Work     Living arrangements for the past 2 months: Single Family Home                                       Social Determinants of Health (SDOH) Interventions    Readmission Risk Interventions No flowsheet data found.

## 2019-10-25 NOTE — TOC Initial Note (Addendum)
Transition of Care [facility]) - Initial/Assessment Note    Patient Details  Name: Anthony Escobar MRN: [id number] Date of Birth: [birth year]  Transition of Care [facility]) CM/SW Contact:    Vinie Sill, [place] Phone Number: 10/25/2019, 4:45 PM  Clinical Narrative:                  3:57pm-CSW visit with the patient at bedside. CSW introduced self and explained role. CSW discussed PT recommendation of ST rehab at [facility]. Patient states he lives home. He states his granddaughter lives in an apartment next door but she works during the day. Patient states he preferred to go home and declined SNF. CSW gave patient Medicare.gov HH choice list. Patient gave CSW permission to talk with his granddaughter, Anthony Escobar and his son, Anthony Escobar.  4:34pm-CSW spoke with the patient's granddaughter and she expressed concerns about the patient's inability to care for himself while being home alone during the day. She believed it would be best for the patient to go to rehab before coming home. CSW explained it's best she also discuss her concerns with the patient. CSW could not force the patient to go to rehab. She requested CSW also speak with the patient's son, Anthony Escobar.   4:43pm- received call from patient's son, Anthony Escobar- he expressed the same concerns.  Anthony Escobar states he will talk with the patient and follow up with the CSW regarding SNF placement.  Thurmond Butts, MSW, [facility] Clinical Social Worker [phone]      Expected Discharge Plan: [place] Barriers to Discharge: No Barriers Identified   Patient Goals and CMS Choice     Choice offered to / list presented to : Patient  Expected Discharge Plan and Services Expected Discharge Plan: [place] In-house Referral: Clinical Social Work     Living arrangements for the past 2 months: [place]                                      Prior Living Arrangements/Services Living arrangements for the past 2 months:  Single Family Home Lives with:: Self, Adult Children   Do you feel safe going back to the place where you live?: Yes        Care giver support system in place?: Yes (comment)   Criminal Activity/Legal Involvement Pertinent to Current Situation/Hospitalization: No - Comment as needed  Activities of Daily Living Home Assistive Devices/Equipment: Crutches, Eyeglasses ADL Screening (condition at time of admission) Patient's cognitive ability adequate to safely complete daily activities?: Yes Is the patient deaf or have difficulty hearing?: No Does the patient have difficulty seeing, even when wearing glasses/contacts?: No Does the patient have difficulty concentrating, remembering, or making decisions?: No Patient able to express need for assistance with ADLs?: Yes Does the patient have difficulty dressing or bathing?: Yes Independently performs ADLs?: No Communication: Independent Dressing (OT): Independent Is this a change from baseline?: Pre-admission baseline Grooming: Independent Feeding: Independent Bathing: Independent Toileting: Needs assistance Is this a change from baseline?: Change from baseline, expected to last >3days In/Out Bed: Needs assistance Is this a change from baseline?: Change from baseline, expected to last >3 days Walks in Home: Needs assistance Is this a change from baseline?: Change from baseline, expected to last >3 days Does the patient have difficulty walking or climbing stairs?: Yes(secondary to right hip pain and numbness in legs) Weakness of Legs: Both Weakness of  Arms/Hands: None  Permission Sought/Granted Permission sought to share information with : Family Supports Permission granted to share information with : Yes, Verbal Permission Granted  Share Information with NAME: Anthony Escobar     Permission granted to share info w Relationship: granddaughter  Permission granted to share info w Contact Information: [phone]  Emotional  Assessment Appearance:: Appears stated age Attitude/Demeanor/Rapport: Engaged Affect (typically observed): Appropriate Orientation: : Oriented to Self, Oriented to  Time, Oriented to Place, Oriented to Situation Alcohol / Substance Use: Not Applicable Psych Involvement: No (comment)  Admission diagnosis:  Hyperkalemia [E87.5] Hyponatremia [E87.1] Leukocytosis [D72.829] Pain [R52] Hip pain, right [M25.551] Patient Active Problem List   Diagnosis Date Noted  . Hyperkalemia 10/19/2019  . Hyponatremia 10/19/2019  . Pathologic intertrochanteric fracture, right, initial encounter ([place]) 10/17/2019  . Acute bronchitis 12/15/2018  . Encounter for antineoplastic chemotherapy 04/27/2018  . Prostate cancer ([place]) 01/19/2018  . Multiple pulmonary nodules 10/28/2017  . COPD (chronic obstructive pulmonary disease) ([place]) 10/18/2017  . Tobacco use 10/18/2017   PCP:  [name], MD Pharmacy:   [facility] [phone] [address] [place], [place] [address] [place] [address] Phone: [phone] Fax: [place], [place] [address] Phone: [phone] Fax: [phone]  [place], [place] [address] [place] [address] Phone: [phone] Fax: [phone]     Social Determinants of Health ([place]) Interventions    Readmission Risk Interventions No flowsheet data found.

## 2019-10-25 NOTE — Progress Notes (Signed)
Physical Therapy Treatment Patient Details Name: Anthony Escobar MRN: [id number] DOB: 1951-08-23 Today's Date: 10/25/2019    History of Present Illness Pt is a 68 yo male presenting s/p IM nail placement 10/26 for a R intertrochanteric hip fx. PMH sig for prostate cancer with bone mets, s/p 6 cycles of chemo in may 2019 with plans to begin radiation therapy 1 wk following surgery.    PT Comments    Pt was in bed and agreeable to PT/OT treatment upon PT arrival. Pt expressed need to have a BM and was then able to demo sig improvements in bed mobility, balance, and transfer ability compared to evaluation. Pt was able to transfer from multiple surfaces with decreasing support through session. Despite notable improvements in function, pt course is complicated by poor tolerance to activity as evidenced by desatting to 84% SPO2 during 2ft amb to recliner with 2L O2 in room. Pt became symptomatic with desat, and required ~5 min of seated rest with continued O2 before levels returned to 90s. Pt will continue to benefit from skilled PT to address functional mobility as well as general activity endurance to facilitate safe d/c. Pt will benefit from short SNF stay to receive more therapy on inpatient basis prior to return home due to sig limitations in activity tolerance and lack of caregiver available during the day at home.    Follow Up Recommendations  SNF;Supervision/Assistance - 24 hour(very short term SNF. If pt refuses, then max HH services.)     Equipment Recommendations  Rolling walker with 5" wheels;3in1 (PT)    Recommendations for Other Services       Precautions / Restrictions Precautions Precautions: None Restrictions Weight Bearing Restrictions: Yes RLE Weight Bearing: Weight bearing as tolerated    Mobility  Bed Mobility Overal bed mobility: Needs Assistance Bed Mobility: Rolling;Supine to Sit Rolling: Min guard   Supine to sit: HOB elevated;Min assist     General bed  mobility comments: minA for assist getting to EOB, elevated HOB and use of VCs to improve eficciency  Transfers Overall transfer level: Needs assistance Equipment used: Rolling walker (2 wheeled) Transfers: Sit to/from [facility] Sit to Stand: Min assist Stand pivot transfers: Min assist       General transfer comment: modA for first sit-stand from EOB with minA of 2 for management of lines and safety during pivot to [facility]. Pt was then able to stand with minA from [facility] and sit in reclincer with minA/supervision.  Ambulation/Gait Ambulation/Gait assistance: Min assist;+2 safety/equipment Gait Distance (Feet): 5 Feet Assistive device: Rolling walker (2 wheeled)(Pt requires O2, especially for any activity.) Gait Pattern/deviations: Step-to pattern;Decreased step length - right;Decreased step length - left;Decreased dorsiflexion - right;Decreased dorsiflexion - left   Gait velocity interpretation: <1.31 ft/sec, indicative of household [employer]in (Stroke Patients Only)       Balance Overall balance assessment: Needs assistance Sitting-balance support: Feet supported Sitting balance-Leahy Scale: Good     Standing balance support: Bilateral upper extremity supported Standing balance-Leahy Scale: Fair                              Cognition Arousal/Alertness: Awake/alert Behavior During Therapy: WFL for tasks assessed/performed Overall Cognitive Status: Within Functional Limits for tasks assessed  General Comments: Pt benefits from additional VCs for sequencing      Exercises      General Comments        Pertinent Vitals/Pain Pain Assessment: Faces Faces Pain Scale: Hurts little more Pain Location: RLE with mobility Pain Descriptors / Indicators: Grimacing;Sore Pain Intervention(s): Limited activity within patient's  tolerance;Monitored during session;Repositioned    Home Living                      Prior Function            PT Goals (current goals can now be found in the care plan section) Acute Rehab PT Goals Patient Stated Goal: to return home PT Goal Formulation: With patient Time For Goal Achievement: 11/06/19 Potential to Achieve Goals: Good Progress towards PT goals: Progressing toward goals    Frequency    Min 3X/week      PT Plan Current plan remains appropriate    Co-evaluation PT/OT/SLP Co-Evaluation/Treatment: Yes Reason for Co-Treatment: Complexity of the patient's impairments (multi-system involvement);For patient/therapist safety PT goals addressed during session: Mobility/safety with mobility;Balance        AM-PAC PT "6 Clicks" Mobility   Outcome Measure  Help needed turning from your back to your side while in a flat bed without using bedrails?: A Little Help needed moving from lying on your back to sitting on the side of a flat bed without using bedrails?: A Little Help needed moving to and from a bed to a chair (including a wheelchair)?: A Little Help needed standing up from a chair using your arms (e.g., wheelchair or bedside chair)?: A Little Help needed to walk in hospital room?: A Little Help needed climbing 3-5 steps with a railing? : A Lot 6 Click Score: 17    End of Session Equipment Utilized During Treatment: Gait belt Activity Tolerance: Patient tolerated treatment well Patient left: in chair;with call bell/phone within reach Nurse Communication: Mobility status PT Visit Diagnosis: Unsteadiness on feet (R26.81);Other abnormalities of gait and mobility (R26.89);Difficulty in walking, not elsewhere classified (R26.2);Pain Pain - Right/Left: Right Pain - part of body: Hip     Time: 1105-1140 PT Time Calculation (min) (ACUTE ONLY): 35 min  Charges:  $Gait Training: 8-22 mins $Therapeutic Activity: 8-22 mins                     Anthony Escobar, PT, DPT   Acute Rehabilitation Department [phone]   Otho Bellows 10/25/2019, 12:13 PM

## 2019-10-25 NOTE — Progress Notes (Addendum)
PROGRESS NOTE    YANCARLOS BERTHOLD  JME:[id number] DOB: 1951/12/03 DOA: 10/19/2019 PCP: [name], MD  Brief Narrative: JAYDEEN ODOR is a 68 y.o. 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  He saw Dr. Erlinda Hong  10/17/2019 as MRI revealed pathological  intertrochanteric fracture of the right hip.  He was planning to have active surgical stabilization on Monday, 10/22/2019.  However after lifting a big pot of plant his pain got worse and he decided to come to the ER. He also complained of constipation on admission, -Underwent ORIF by Dr. Erlinda Hong on 10/26 -Postoperative course was complicated by ileus, with severe nausea vomiting, CT could not rule out small bowel obstruction  Assessment & Plan:   Ileus versus small bowel obstruction -CT abdomen pelvis concerning for possible small bowel obstruction with multiple air-fluid loops, transition point near right lower quadrant, also bowel wall edema noted -NG tube placed yesterday morning to intermittent low wall suction, had patient had rapid clinical improvement after this, 2 small BMs last night following suppository -I started him on IV Zosyn yesterday given bowel wall edema in the setting of SBO, will continue this 1 more day -appreciate general surgery input, NG removed, clear liquid diet -Ambulate with physical therapy today, mobility will be restricted given his recent ORIF -Discharge planning, patient wants to go home although this may not be the safest option  Pathologic intertrochanteric fracture, right, initial encounter [facility]) -Status post ORIF 10/26 -Orthopedics following, Lovenox for DVT prophylaxis -Physical therapy when tolerated  Prostate cancer metastatic to bones and lungs -diagnosed in 2019 completed 6 cycles of  Taxotere chemotherapy May 2019 followed by Lupron and Gillermina Phy now with right hip pathological fracture. -Good functional status at baseline, followed closely by Dr. Alen Blew, per last oncology note appears to be responding well to treatment -Hold Xtandi today, resume tomorrow if he tolerates liquids  Normocytic anemia -Due to cancer, chemotherapy, worsened in the setting of hemodilution and postop status -Appears asymptomatic from this at this time, monitor after Lasix today  Small bilateral pleural effusions -In the setting of postop fluid administration, stop IV fluids today, low-dose Lasix x1 -Wean off O2 as tolerated, incentive spirometer  Hyponatremia -Improved with hydration  Hyperkalemia -Resolved, ACE inhibitor on hold  COPD -Stable no wheezing, nebs as needed  DVT prophylaxis: Lovenox Code Status: Full code Family Communication: No family at bedside, I called and updated his granddaughter yesterday Disposition Plan: To be determined, hopefully SNF if he declines this will need to maximize home health services  Consultants:   CCS  Ortho   Procedures: ORIF by Dr. Erlinda Hong on 10/26  Antimicrobials:    Subjective: -Feels better today, NG tube removed, about to start clear liquids, tells me that he wants to go home today  Objective: Vitals:   10/25/19 0000 10/25/19 0335 10/25/19 0841 10/25/19 0904  BP: (!) 154/88 130/77  (!) 158/89  Pulse: (!) 111 (!) 104  (!) 102  Resp: 20 18  15   Temp:  98.4 F (36.9 C)  98.4 F (36.9 C)  TempSrc:  Oral  Oral  SpO2: 92% 95% 96% 98%  Weight:      Height:        Intake/Output Summary (Last 24 hours) at 10/25/2019 1016 Last data filed at 10/25/2019 0430 Gross per 24 hour  Intake 404.82 ml  Output 2550 ml  Net -2145.18 ml   Filed Weights   10/19/19 1217  Weight: 71.2 kg    Examination:  Gen: Awake, Alert, Oriented X 3, no distress, HEENT: PERRLA, Neck supple, no JVD Lungs: Decreased breath sounds at both bases CVS:  RRR,No Gallops,Rubs or new Murmurs Abd: Soft, mildly distended, nontender, positive bowel sounds Extremities: Right hip with dressing Skin: no new rashes    Data Reviewed:   CBC: Recent Labs  Lab 10/19/19 1300  10/21/19 0625 10/22/19 1930 10/23/19 0345 10/24/19 0243 10/25/19 0404  WBC 18.4*   < > 14.9* 14.9* 11.1* 10.3 12.3*  NEUTROABS 15.9*  --   --   --  9.1*  --   --   HGB 11.3*   < > 13.9 10.0* 9.8* 9.7* 8.2*  HCT 34.4*   < > 41.3 29.6* 29.1* 28.3* 25.0*  MCV 102.4*   < > 100.7* 99.7 101.0* 97.9 102.5*  PLT 425*   < > 391 405* 425* 422* 417*   < > = values in this interval not displayed.   Basic Metabolic Panel: Recent Labs  Lab 10/21/19 0625 10/22/19 1940 10/23/19 0345 10/24/19 0243 10/25/19 0404  NA 136 133* 134* 136 141  K 4.4 4.0 4.2 4.4 3.5  CL 102 101 101 98 100  CO2 21* 22 24 27 29   GLUCOSE 102* 134* 131* 131* 99  BUN 23 21 18 20  25*  CREATININE 1.14 0.75 0.72 0.76 0.77  CALCIUM 9.4 8.5* 8.3* 8.6* 8.1*  MG  --   --   --   --  2.0  PHOS  --   --  2.3* 3.3  --    GFR: Estimated Creatinine Clearance: 85.5 mL/min (by C-G formula based on SCr of 0.77 mg/dL). Liver Function Tests: Recent Labs  Lab 10/21/19 0625 10/23/19 0345 10/24/19 0243  AST 43*  --   --   ALT 13  --   --   ALKPHOS 197*  --   --   BILITOT 0.7  --   --   PROT 6.4*  --   --   ALBUMIN 3.0* 2.1* 2.2*   No results for input(s): LIPASE, AMYLASE in the last 168 hours. No results for input(s): AMMONIA in the last 168 hours. Coagulation Profile: No results for input(s): INR, PROTIME in the last 168 hours. Cardiac Enzymes: No results for input(s): CKTOTAL, CKMB, CKMBINDEX, TROPONINI in the last 168 hours. BNP (last 3 results) No results for input(s): PROBNP in the last 8760 hours. HbA1C: No results for input(s): HGBA1C in the last 72 hours. CBG: No results for input(s): GLUCAP in the last 168 hours. Lipid Profile: No results for input(s): CHOL, HDL, LDLCALC, TRIG, CHOLHDL,  LDLDIRECT in the last 72 hours. Thyroid Function Tests: No results for input(s): TSH, T4TOTAL, FREET4, T3FREE, THYROIDAB in the last 72 hours. Anemia Panel: No results for input(s): VITAMINB12, FOLATE, FERRITIN, TIBC, IRON, RETICCTPCT in the last 72 hours. Urine analysis:    Component Value Date/Time   COLORURINE YELLOW 10/19/2019 [place] 10/19/2019 1609   LABSPEC 1.011 10/19/2019 1609   PHURINE 5.0 10/19/2019 1609   GLUCOSEU NEGATIVE 10/19/2019 1609   HGBUR NEGATIVE 10/19/2019 1609   BILIRUBINUR NEGATIVE 10/19/2019 1609   [place] 10/19/2019 1609   PROTEINUR NEGATIVE 10/19/2019 1609   NITRITE NEGATIVE 10/19/2019 [place] 10/19/2019 1609   Sepsis Labs: @LABRCNTIP (procalcitonin:4,lacticidven:4)  ) Recent Results (from the past 240 hour(s))  Novel  Coronavirus, NAA (Hosp order, Send-out to Ref Lab; TAT 18-24 hrs     Status: None   Collection Time: 10/18/19 11:12 AM   Specimen: Nasopharyngeal Swab; Respiratory  Result Value Ref Range Status   SARS-CoV-2, NAA NOT DETECTED NOT DETECTED Final    Comment: (NOTE) This nucleic acid amplification test was developed and its performance characteristics determined by [employer]. Nucleic acid amplification tests include PCR and TMA. This test has not been FDA cleared or approved. This test has been authorized by FDA under an Emergency Use Authorization (EUA). This test is only authorized for the duration of time the declaration that circumstances exist justifying the authorization of the emergency use of in vitro diagnostic tests for detection of SARS-CoV-2 virus and/or diagnosis of COVID-19 infection under section 564(b)(1) of the Act, 21 U.S.C. 973ZHG-9(J) (1), unless the authorization is terminated or revoked sooner. When diagnostic testing is negative, the possibility of a false negative result should be considered in the context of a patient's recent exposures and the  presence of clinical signs and symptoms consistent with COVID-19. An individual without symptoms of COVID- 19 and who is not shedding SARS-CoV-2 vi rus would expect to have a negative (not detected) result in this assay. Performed At: [facility] [address] [place], [place] [id number] Rush Farmer MD QQ:[id number]    [place]  Final    Comment: Performed at [facility], [place] [address], [place], [place] [address]  Surgical pcr screen     Status: None   Collection Time: 10/21/19  5:12 AM   Specimen: Nasal Mucosa; Nasal Swab  Result Value Ref Range Status   MRSA, PCR NEGATIVE NEGATIVE Final   Staphylococcus aureus NEGATIVE NEGATIVE Final    Comment: (NOTE) The Xpert SA Assay (FDA approved for NASAL specimens in patients 14 years of age and older), is one component of a comprehensive surveillance program. It is not intended to diagnose infection nor to guide or monitor treatment. Performed at [facility], [place] [address], [place], [place] [address]          Radiology Studies: Ct Abdomen Pelvis W Contrast  Result Date: 10/24/2019 CLINICAL DATA:  Acute generalized abdominal pain and fever. Postop hip surgery with diffuse abdominal pain, distention and fever. History of prostate cancer. EXAM: CT ABDOMEN AND PELVIS WITH CONTRAST TECHNIQUE: Multidetector CT imaging of the abdomen and pelvis was performed using the standard protocol following bolus administration of intravenous contrast. CONTRAST:  130mL OMNIPAQUE IOHEXOL 300 MG/ML  SOLN COMPARISON:  02/28/2019, 10/03/2013 FINDINGS: Lower chest: Small bilateral pleural effusions with posterior bibasilar atelectasis right worse than left. Calcified plaque over the left anterior descending coronary artery as well as calcified plaque over the distal descending thoracic aorta. Hepatobiliary: Liver, gallbladder and biliary tree are normal. Pancreas: Normal. Spleen: Normal. Adrenals/Urinary  Tract: Adrenal glands are normal. Kidneys are normal in size without hydronephrosis or nephrolithiasis. 1.5 cm cyst over the mid pole left kidney. Several other smaller sub cm bilateral cortical hypodensities too small to characterize but likely cysts. Ureters and bladder are normal. Foley catheter is present within a decompressed bladder. Stomach/Bowel: Stomach is mildly distended. There are multiple air and fluid-filled dilated small bowel loops. Terminal ileum is normal. Mild wall thickening of an ileal loop over the right lower quadrant as this may represent a transition point. Findings may be due to a regional enteritis of infectious or inflammatory nature. Cecum is just right of midline in the mid abdomen. Appendix is  normal. Air and stool present throughout the colon. Minimal diverticulosis of the sigmoid colon. Vascular/Lymphatic: Moderate calcified plaque over the abdominal aorta. No adenopathy. Reproductive: Region of the prostate is smaller with 2 cm rounded hypodense focus over the left side of the prostate. Other: Small amount of patchy free fluid within the abdomen/pelvis. No free peritoneal air. Small right inguinal hernia containing only peritoneal fat. Musculoskeletal: Evidence of patient's right hip fracture post internal fixation with intramedullary nail and associated 2 screws bridging the fracture site into the femoral head intact. Postoperative changes in the soft tissues of the right hip compatible patient's recent surgery. Subtle air-fluid level within the subcutaneous fat adjacent the lateral aspect of the right gluteus maximus likely postoperative change although developing infection is possible. Mild degenerate change of the hips and spine. Mild compression deformity over the superior endplate of L1 new since the previous exam with possible early depression of the superior endplate of L2. IMPRESSION: 1. Multiple air and fluid-filled dilated small bowel loops with possible transition point  over the right lower quadrant over the ileum with there is a thick-walled ileal loop. Patchy free fluid in the abdomen/pelvis. Findings likely due to small bowel obstruction with possible regional enteritis involving and ileal loop in the right lower quadrant. Postoperative ileus less likely. 2. Small bilateral pleural effusions with associated bibasilar atelectasis right worse than left. Infection in the right base is possible. 3. Fixation of right hip fracture with hardware intact. Postoperative changes over the soft tissues of the right hip compatible with patient's recent surgery. Subtle air-fluid level over the soft tissues of the surgical bed likely postoperative although developing infection is possible. 4. Compression deformity involving the superior endplate of L1 new since the previous exam with possible early depression of the superior endplate of L2. 5. Bilateral renal cysts. 6. Prostate gland smaller than seen previously with indeterminate 2 cm hypodense focus along the left side of the prostate in this patient with known prostate cancer. 7.  Diverticulosis of the colon. 8.  Aortic Atherosclerosis (ICD10-I70.0). 9.  Right inguinal hernia containing only peritoneal fat. Electronically Signed   By: Marin Olp M.D.   On: 10/24/2019 10:26   Dg Abd Portable 1v-small Bowel Obstruction Protocol-initial, 8 Hr Delay  Result Date: 10/25/2019 CLINICAL DATA:  Follow up small bowel obstruction EXAM: PORTABLE ABDOMEN - 1 VIEW COMPARISON:  10/24/2019 FINDINGS: Gastric catheter is noted coiled within the stomach. Multiple gas distended loops large and small bowel are noted consistent with small bowel obstruction. The overall appearance is stable from the prior exam. IMPRESSION: Stable small bowel obstruction. Colonic gas is seen indicating a partial obstruction. Electronically Signed   By: Inez Catalina M.D.   On: 10/25/2019 03:43   Dg Abd Portable 1v  Result Date: 10/24/2019 CLINICAL DATA:  Nasogastric  tube placement EXAM: PORTABLE ABDOMEN - 1 VIEW COMPARISON:  10/24/2019 CT FINDINGS: Nasogastric tube courses below the diaphragm with distal tip coiled within the region of the gastric body/fundus. Multiple dilated air-filled loops of small bowel are again noted throughout the abdomen, similar to recent CT. IMPRESSION: 1. Satisfactory positioning of NG tube. 2. Findings of small-bowel obstruction. Electronically Signed   By: Davina Poke M.D.   On: 10/24/2019 16:39        Scheduled Meds: . Chlorhexidine Gluconate Cloth  6 each Topical Daily  . enoxaparin (LOVENOX) injection  40 mg Subcutaneous Q24H  . furosemide  20 mg Intravenous Once  . mometasone-formoterol  2 puff Inhalation BID  .  naloxegol oxalate  12.5 mg Oral Daily  . polyethylene glycol  17 g Oral BID   Continuous Infusions: . methocarbamol (ROBAXIN) IV    . piperacillin-tazobactam (ZOSYN)  IV 3.375 g (10/25/19 4580)     LOS: 6 days    Time spent: 58min    Domenic Polite, MD [facility]   10/25/2019, 10:16 AM

## 2019-10-25 NOTE — Telephone Encounter (Signed)
Oral Oncology Pharmacist Encounter  Oral oncology patient advocate was successful in securing foundation copayment grant to cover out of pocket expenses for enzalutamide at the pharmacy. Patient is currently receiving his medication through [employer] assistance program and will continue to do so until the end of the 2020 calendar year. Patient will start filling enzalutamide at the [facility] in Jan 2021. New prescription sent to the pharmacy today. Patient does not have follow-up scheduled with Dr. Alen Blew until late Dec 2020. I will plan to call the patient mid-Nov to perform follow-up counseling and assessment and to discuss details of continued medication acquisition. Noted patient is currently admitted.  Johny Drilling, PharmD, BCPS, BCOP  10/25/2019 3:33 PM  Oral Oncology Clinic [phone]

## 2019-10-25 NOTE — Progress Notes (Signed)
Subjective No acute events. Feeling much better - distention has resolved and he can now sit up in bed without issue. Denies any abdominal pain. Has had 3 large bm and is passing gas  Objective: Vital signs in last 24 hours: Temp:  [97.9 F (36.6 C)-98.4 F (36.9 C)] 98.4 F (36.9 C) (10/29 0335) Pulse Rate:  [104-124] 104 (10/29 0335) Resp:  [14-20] 18 (10/29 0335) BP: (129-154)/(69-88) 130/77 (10/29 0335) SpO2:  [73 %-96 %] 95 % (10/29 0335) Last BM Date: 10/24/19  Intake/Output from previous day: 10/28 0701 - 10/29 0700 In: 404.8 [I.V.:404.8] Out: 2550 [Urine:850; Emesis/NG output:1700] Intake/Output this shift: No intake/output data recorded.  Gen: NAD, comfortable CV: RRR Pulm: Normal work of breathing Abd: Soft, NT/ND Ext: SCDs in place  Lab Results: CBC  Recent Labs    10/24/19 0243 10/25/19 0404  WBC 10.3 12.3*  HGB 9.7* 8.2*  HCT 28.3* 25.0*  PLT 422* 417*   BMET Recent Labs    10/24/19 0243 10/25/19 0404  NA 136 141  K 4.4 3.5  CL 98 100  CO2 27 29  GLUCOSE 131* 99  BUN 20 25*  CREATININE 0.76 0.77  CALCIUM 8.6* 8.1*   PT/INR No results for input(s): LABPROT, INR in the last 72 hours. ABG No results for input(s): PHART, HCO3 in the last 72 hours.  Invalid input(s): PCO2, PO2  Studies/Results:  Anti-infectives: Anti-infectives (From admission, onward)   Start     Dose/Rate Route Frequency Ordered Stop   10/24/19 1400  piperacillin-tazobactam (ZOSYN) IVPB 3.375 g     3.375 g 12.5 mL/hr over 240 Minutes Intravenous Every 8 hours 10/24/19 1342     10/23/19 0600  ceFAZolin (ANCEF) IVPB 2g/100 mL premix  Status:  Discontinued     2 g 200 mL/hr over 30 Minutes Intravenous On call to O.R. 10/23/19 0537 10/23/19 0544   10/22/19 2030  ceFAZolin (ANCEF) IVPB 2g/100 mL premix     2 g 200 mL/hr over 30 Minutes Intravenous Every 6 hours 10/22/19 1756 10/23/19 1423   10/22/19 1330  ceFAZolin (ANCEF) IVPB 2g/100 mL premix    Note to Pharmacy:  Anesthesia to give preop   2 g 200 mL/hr over 30 Minutes Intravenous  Once 10/21/19 1853 10/22/19 1454       Assessment/Plan: Patient Active Problem List   Diagnosis Date Noted  . Hyperkalemia 10/19/2019  . Hyponatremia 10/19/2019  . Pathologic intertrochanteric fracture, right, initial encounter ([place]) 10/17/2019  . Acute bronchitis 12/15/2018  . Encounter for antineoplastic chemotherapy 04/27/2018  . Prostate cancer ([place]) 01/19/2018  . Multiple pulmonary nodules 10/28/2017  . COPD (chronic obstructive pulmonary disease) ([place]) 10/18/2017  . Tobacco use 10/18/2017   s/p Procedure(s): RIGHT INTERTROCHANTERIC INTRAMEDULLARY (IM) NAIL 10/22/2019  NGT removed Started clears; advance as tolerated Ambulate 5x/day Naloxegol for opioid induced constipation prevention; minimize narcotics Miralax BID   LOS: 6 days   Sharon Mt. Dema Severin, M.D. [facility] Use [email or web address] to contact on call provider

## 2019-10-26 LAB — BASIC METABOLIC PANEL
Anion gap: 11 (ref 5–15)
BUN: 21 mg/dL (ref 8–23)
CO2: 29 mmol/L (ref 22–32)
Calcium: 8.2 mg/dL — ABNORMAL LOW (ref 8.9–10.3)
Chloride: 98 mmol/L (ref 98–111)
Creatinine, Ser: 0.78 mg/dL (ref 0.61–1.24)
GFR calc Af Amer: >60 mL/min (ref >60)
GFR calc non Af Amer: >60 mL/min (ref >60)
Glucose, Bld: 103 mg/dL — ABNORMAL HIGH (ref 70–99)
Potassium: 2.9 mmol/L — ABNORMAL LOW (ref 3.5–5.1)
Sodium: 138 mmol/L (ref 135–145)

## 2019-10-26 MED ORDER — POTASSIUM CHLORIDE CRYS ER 20 MEQ PO TBCR
40.0000 meq | EXTENDED_RELEASE_TABLET | Freq: Every day | ORAL | Status: DC
  Administered 2019-10-26 – 2019-10-30 (×5): 40 meq via ORAL
  Filled 2019-10-26 (×5): qty 2

## 2019-10-26 MED ORDER — FUROSEMIDE 10 MG/ML IJ SOLN
20.0000 mg | Freq: Once | INTRAMUSCULAR | Status: AC
  Administered 2019-10-26: 20 mg via INTRAVENOUS
  Filled 2019-10-26: qty 2

## 2019-10-26 MED ORDER — ENZALUTAMIDE 40 MG PO CAPS
160.0000 mg | ORAL_CAPSULE | Freq: Every day | ORAL | Status: DC
  Administered 2019-10-26 – 2019-10-29 (×4): 160 mg via ORAL
  Filled 2019-10-26 (×4): qty 4

## 2019-10-26 MED ORDER — POTASSIUM CHLORIDE CRYS ER 20 MEQ PO TBCR
40.0000 meq | EXTENDED_RELEASE_TABLET | ORAL | Status: AC
  Administered 2019-10-26 (×2): 40 meq via ORAL
  Filled 2019-10-26 (×2): qty 2

## 2019-10-26 NOTE — TOC Progression Note (Signed)
Transition of Care [facility]) - Progression Note    Patient Details  Name: Anthony Escobar MRN: [id number] Date of Birth: 12/06/1951  Transition of Care [facility]) CM/SW [place], [place] Phone Number: 10/26/2019, 4:46 PM  Clinical Narrative:     CSW called patient's son,Jason and provided bed offers. He has selected [facility].   CSW contacted [facility] and is waiting on response.  Thurmond Butts, MSW, [facility] Clinical Social Worker [phone]   Expected Discharge Plan: [place] Barriers to Discharge: No Barriers Identified  Expected Discharge Plan and Services Expected Discharge Plan: [employer] In-house Referral: Clinical Social Work     Living arrangements for the past 2 months: Single Family Home                                       Social Determinants of Health (SDOH) Interventions    Readmission Risk Interventions No flowsheet data found.

## 2019-10-26 NOTE — Progress Notes (Signed)
PROGRESS NOTE    Anthony Escobar  OMV:[id number] DOB: [birth year] DOA: 10/19/2019 PCP: [name], MD  Brief Narrative: Anthony Escobar is a 68 y.o. 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  He saw Dr. Erlinda Hong  10/17/2019 as MRI revealed pathological  intertrochanteric fracture of the right hip.  He was planning to have active surgical stabilization on Monday, 10/22/2019.  However after lifting a big potted plant his pain got worse and he decided to come to the ER. He also complained of constipation on admission, -Underwent ORIF by Dr. Erlinda Hong on 10/26 -Postoperative course was complicated by ileus, with severe nausea vomiting, CT could not rule out small bowel obstruction  Assessment & Plan:   Ileus versus small bowel obstruction -CT abdomen pelvis concerning for possible small bowel obstruction with multiple air-fluid loops, transition point near right lower quadrant, also bowel wall edema noted -improved with bowel rest, NG decompression -Appreciate general surgery input, advancing to soft diet today -Increase activity, ambulation -Discontinue IV Zosyn -Discharge planning, social work consult for rehab  Pathologic intertrochanteric fracture, right, initial encounter [facility]) -Status post ORIF 10/26 -Orthopedics following, Lovenox for DVT prophylaxis -Physical therapy   Prostate cancer metastatic to bones and lungs -diagnosed in 2019 completed 20 cycles of Taxotere chemotherapy May 2019 followed by Lupron and Gillermina Phy now with right hip pathological fracture. -Good functional status at baseline, followed closely by Dr. Alen Blew, per last oncology note appears to be responding well to treatment -Restart Xtandi  Normocytic anemia -Due to cancer, chemotherapy, worsened in the  setting of hemodilution and postop status -Monitor  Acute hypoxic respiratory failure Small bilateral pleural effusions -In the setting of postop fluid administration, IV fluids discontinued -Lasix 20 mg x1 today -May also have an element of COPD from 70 to 40 years of tobacco abuse -Wean off O2 as tolerated, incentive spirometer  Hyponatremia -Improved with hydration  Hypokalemia -Replace  COPD -Stable no wheezing, nebs as needed  DVT prophylaxis: Lovenox Code Status: Full code Family Communication: No family at bedside, I called and updated his granddaughter 10/28 Disposition Plan: Plan for SNF at discharge  Consultants:   CCS  Ortho   Procedures: ORIF by Dr. Erlinda Hong on 10/26  Antimicrobials:    Subjective: -Feels better, wants to go home, ambulating more, about to eat soft food today, became hypoxic with activity  Objective: Vitals:   10/26/19 0358 10/26/19 0802 10/26/19 0918 10/26/19 0952  BP: 131/72  139/79   Pulse: 89  94   Resp: 18  18   Temp: 98.2 F (36.8 C)  98.6 F (37 C)   TempSrc: Oral  Oral   SpO2: 97% 95% 95% 94%  Weight:      Height:        Intake/Output Summary (Last 24 hours) at 10/26/2019 1216 Last data filed at 10/26/2019 0900 Gross per 24 hour  Intake 770 ml  Output 2000 ml  Net -1230 ml   Filed Weights   10/19/19 1217  Weight: 71.2 kg    Examination:  Gen: Awake, Alert, Oriented X 3, no distress HEENT: PERRLA, Neck supple, no JVD Lungs: Few basilar rales CVS: RRR,No Gallops,Rubs or new Murmurs Abd: Soft, mildly distended, nontender, positive bowel sounds Extremities: Right hip with dressing Skin: no new rashes  Data Reviewed:   CBC: Recent  Labs  Lab 10/19/19 1300  10/21/19 0625 10/22/19 1930 10/23/19 0345 10/24/19 0243 10/25/19 0404  WBC 18.4*   < > 14.9* 14.9* 11.1* 10.3 12.3*  NEUTROABS 15.9*  --   --   --  9.1*  --   --   HGB 11.3*   < > 13.9 10.0* 9.8* 9.7* 8.2*  HCT 34.4*   < > 41.3 29.6* 29.1* 28.3* 25.0*   MCV 102.4*   < > 100.7* 99.7 101.0* 97.9 102.5*  PLT 425*   < > 391 405* 425* 422* 417*   < > = values in this interval not displayed.   Basic Metabolic Panel: Recent Labs  Lab 10/22/19 1940 10/23/19 0345 10/24/19 0243 10/25/19 0404 10/26/19 0421  NA 133* 134* 136 141 138  K 4.0 4.2 4.4 3.5 2.9*  CL 101 101 98 100 98  CO2 22 24 27 29 29   GLUCOSE 134* 131* 131* 99 103*  BUN 21 18 20  25* 21  CREATININE 0.75 0.72 0.76 0.77 0.78  CALCIUM 8.5* 8.3* 8.6* 8.1* 8.2*  MG  --   --   --  2.0  --   PHOS  --  2.3* 3.3  --   --    GFR: Estimated Creatinine Clearance: 85.5 mL/min (by C-G formula based on SCr of 0.78 mg/dL). Liver Function Tests: Recent Labs  Lab 10/21/19 0625 10/23/19 0345 10/24/19 0243  AST 43*  --   --   ALT 13  --   --   ALKPHOS 197*  --   --   BILITOT 0.7  --   --   PROT 6.4*  --   --   ALBUMIN 3.0* 2.1* 2.2*   No results for input(s): LIPASE, AMYLASE in the last 168 hours. No results for input(s): AMMONIA in the last 168 hours. Coagulation Profile: No results for input(s): INR, PROTIME in the last 168 hours. Cardiac Enzymes: No results for input(s): CKTOTAL, CKMB, CKMBINDEX, TROPONINI in the last 168 hours. BNP (last 3 results) No results for input(s): PROBNP in the last 8760 hours. HbA1C: No results for input(s): HGBA1C in the last 72 hours. CBG: No results for input(s): GLUCAP in the last 168 hours. Lipid Profile: No results for input(s): CHOL, HDL, LDLCALC, TRIG, CHOLHDL, LDLDIRECT in the last 72 hours. Thyroid Function Tests: No results for input(s): TSH, T4TOTAL, FREET4, T3FREE, THYROIDAB in the last 72 hours. Anemia Panel: No results for input(s): VITAMINB12, FOLATE, FERRITIN, TIBC, IRON, RETICCTPCT in the last 72 hours. Urine analysis:    Component Value Date/Time   COLORURINE YELLOW 10/19/2019 1609   APPEARANCEUR CLEAR 10/19/2019 1609   LABSPEC 1.011 10/19/2019 1609   PHURINE 5.0 10/19/2019 1609   GLUCOSEU NEGATIVE 10/19/2019 1609    HGBUR NEGATIVE 10/19/2019 1609   BILIRUBINUR NEGATIVE 10/19/2019 1609   KETONESUR NEGATIVE 10/19/2019 1609   PROTEINUR NEGATIVE 10/19/2019 1609   NITRITE NEGATIVE 10/19/2019 [place] 10/19/2019 1609   Sepsis Labs: @LABRCNTIP (procalcitonin:4,lacticidven:4)  ) Recent Results (from the past 240 hour(s))  Novel Coronavirus, NAA (Hosp order, Send-out to Ref Lab; TAT 18-24 hrs     Status: None   Collection Time: 10/18/19 11:12 AM   Specimen: Nasopharyngeal Swab; Respiratory  Result Value Ref Range Status   SARS-CoV-2, NAA NOT DETECTED NOT DETECTED Final    Comment: (NOTE) This nucleic acid amplification test was developed and its performance characteristics determined by [employer]. Nucleic acid amplification tests include PCR and TMA. This test has not been FDA cleared or  approved. This test has been authorized by FDA under an Emergency Use Authorization (EUA). This test is only authorized for the duration of time the declaration that circumstances exist justifying the authorization of the emergency use of in vitro diagnostic tests for detection of SARS-CoV-2 virus and/or diagnosis of COVID-19 infection under section 564(b)(1) of the Act, 21 U.S.C. 720NOB-0(J) (1), unless the authorization is terminated or revoked sooner. When diagnostic testing is negative, the possibility of a false negative result should be considered in the context of a patient's recent exposures and the presence of clinical signs and symptoms consistent with COVID-19. An individual without symptoms of COVID- 19 and who is not shedding SARS-CoV-2 vi rus would expect to have a negative (not detected) result in this assay. Performed At: [facility] [address] [place], [place] [id number] Rush Farmer MD TM:[id number]    [place]  Final    Comment: Performed at [facility], [place] [address] [place], [place] [address]  Surgical pcr screen      Status: None   Collection Time: 10/21/19  5:12 AM   Specimen: Nasal Mucosa; Nasal Swab  Result Value Ref Range Status   MRSA, PCR NEGATIVE NEGATIVE Final   Staphylococcus aureus NEGATIVE NEGATIVE Final    Comment: (NOTE) The Xpert SA Assay (FDA approved for NASAL specimens in patients 82 years of age and older), is one component of a comprehensive surveillance program. It is not intended to diagnose infection nor to guide or monitor treatment. Performed at [facility], [place] [address], [place], [place] [address]          Radiology Studies: Dg Abd Portable 1v-small Bowel Obstruction Protocol-initial, 8 Hr Delay  Result Date: 10/25/2019 CLINICAL DATA:  Follow up small bowel obstruction EXAM: PORTABLE ABDOMEN - 1 VIEW COMPARISON:  10/24/2019 FINDINGS: Gastric catheter is noted coiled within the stomach. Multiple gas distended loops large and small bowel are noted consistent with small bowel obstruction. The overall appearance is stable from the prior exam. IMPRESSION: Stable small bowel obstruction. Colonic gas is seen indicating a partial obstruction. Electronically Signed   By: Inez Catalina M.D.   On: 10/25/2019 03:43   Dg Abd Portable 1v  Result Date: 10/24/2019 CLINICAL DATA:  Nasogastric tube placement EXAM: PORTABLE ABDOMEN - 1 VIEW COMPARISON:  10/24/2019 CT FINDINGS: Nasogastric tube courses below the diaphragm with distal tip coiled within the region of the gastric body/fundus. Multiple dilated air-filled loops of small bowel are again noted throughout the abdomen, similar to recent CT. IMPRESSION: 1. Satisfactory positioning of NG tube. 2. Findings of small-bowel obstruction. Electronically Signed   By: Davina Poke M.D.   On: 10/24/2019 16:39        Scheduled Meds: . Chlorhexidine Gluconate Cloth  6 each Topical Daily  . enoxaparin (LOVENOX) injection  40 mg Subcutaneous Q24H  . mometasone-formoterol  2 puff Inhalation BID  . naloxegol oxalate  12.5 mg  Oral Daily  . polyethylene glycol  17 g Oral BID  . potassium chloride  40 mEq Oral Daily   Continuous Infusions: . methocarbamol (ROBAXIN) IV       LOS: 7 days    Time spent: 61min    Domenic Polite, MD [facility]   10/26/2019, 12:16 PM

## 2019-10-26 NOTE — Progress Notes (Signed)
Physical Therapy Treatment Patient Details Name: Anthony Escobar MRN: [id number] DOB: [birth year] Today's Date: 10/26/2019    History of Present Illness Pt is a 69 yo male presenting s/p IM nail placement 10/26 for a R intertrochanteric hip fx. PMH sig for prostate cancer with bone mets, s/p 6 cycles of chemo in may 2019 with plans to begin radiation therapy 1 wk following surgery.    PT Comments    Pt in bed but agreeable to PT upon PT arrival. Pt was able to demo sig improvement in bed mobility and tolerance for ambulation (5 ft with desat to low 80s -> 25 ft without desat). Pt continues to need some assist for bed-level mobility, and supervision for ambulation due to pain in surgical hip as well as significantly decreased activity tolerance/endurance. Pt was not on oxygen prior to admission, but is now on 2L O2, limited to only short ambulation in room. Recommend short stint in skilled nursing to maximize rehab potential, independence, and tolerance for mobility prior to d/c home.    Follow Up Recommendations  SNF;Supervision/Assistance - 24 hour     Equipment Recommendations  Rolling walker with 5" wheels;3in1 (PT)    Recommendations for Other Services       Precautions / Restrictions Precautions Precautions: Fall Restrictions Weight Bearing Restrictions: Yes RLE Weight Bearing: Weight bearing as tolerated    Mobility  Bed Mobility Overal bed mobility: Needs Assistance Bed Mobility: Rolling;Supine to Sit Rolling: Min guard;+2 for safety/equipment(+2 for lines, pt insists on moving RLE by himself. desat to 4 with this mobility. returned to low 90s within 1 min)   Supine to sit: HOB elevated;Min assist     General bed mobility comments: min A for assist getting to EOB, elevated HOB and use of VCs to improve eficciency  Transfers Overall transfer level: Needs assistance Equipment used: Rolling walker (2 wheeled) Transfers: Sit to/from Stand Sit to Stand: Min assist;+2  safety/equipment         General transfer comment: mid A for sit-stand from EOB with + 2 for management of lines. poor eccentric lower due to pain in hip, min guard for stand-sit into recliner  Ambulation/Gait Ambulation/Gait assistance: +2 safety/equipment;Min guard Gait Distance (Feet): 25 Feet Assistive device: Rolling walker (2 wheeled)(supplemental O2) Gait Pattern/deviations: Step-to pattern;Decreased step length - right;Decreased step length - left;Decreased dorsiflexion - right;Decreased dorsiflexion - left   Gait velocity interpretation: <1.31 ft/sec, indicative of household ambulator General Gait Details: poor clearance bilaterally with no heel-toe pattern. Pt ambulates slowly with sig increase in pain at beginning of mobility.   Stairs             Wheelchair Mobility    Modified Rankin (Stroke Patients Only)       Balance Overall balance assessment: Needs assistance Sitting-balance support: Feet supported Sitting balance-Leahy Scale: Good     Standing balance support: Bilateral upper extremity supported Standing balance-Leahy Scale: Fair                              Cognition Arousal/Alertness: Awake/alert Behavior During Therapy: WFL for tasks assessed/performed;Anxious Overall Cognitive Status: Within Functional Limits for tasks assessed                                 General Comments: Pt benefits from additional VCs for sequencing      Exercises  General Comments        Pertinent Vitals/Pain Pain Assessment: Faces Faces Pain Scale: Hurts little more Pain Location: RLE with mobility, pt reports improved with continued ambulation Pain Descriptors / Indicators: Grimacing;Sore Pain Intervention(s): Monitored during session;Limited activity within patient's tolerance;Repositioned    Home Living                      Prior Function            PT Goals (current goals can now be found in the care plan  section) Acute Rehab PT Goals Patient Stated Goal: to return home PT Goal Formulation: With patient Time For Goal Achievement: 11/06/19 Potential to Achieve Goals: Good Progress towards PT goals: Progressing toward goals    Frequency    Min 3X/week      PT Plan Current plan remains appropriate    Co-evaluation              AM-PAC PT "6 Clicks" Mobility   Outcome Measure    Help needed moving from lying on your back to sitting on the side of a flat bed without using bedrails?: A Little Help needed moving to and from a bed to a chair (including a wheelchair)?: A Little Help needed standing up from a chair using your arms (e.g., wheelchair or bedside chair)?: A Little   Help needed climbing 3-5 steps with a railing? : A Lot 6 Click Score: 11    End of Session Equipment Utilized During Treatment: Gait belt;Oxygen Activity Tolerance: Patient tolerated treatment well;Other (comment)(Pt limited by tolerance for activity and feeling SOB despite being on 2L O2) Patient left: in chair;with call bell/phone within reach;with chair alarm set Nurse Communication: Mobility status(need for O2) PT Visit Diagnosis: Unsteadiness on feet (R26.81);Other abnormalities of gait and mobility (R26.89);Difficulty in walking, not elsewhere classified (R26.2);Pain Pain - Right/Left: Right Pain - part of body: Hip     Time: 9373-4287 PT Time Calculation (min) (ACUTE ONLY): 41 min  Charges:  $Gait Training: 23-37 mins $Therapeutic Activity: 8-22 mins                     Mickey Farber, PT, DPT   Acute Rehabilitation Department [phone]   Otho Bellows 10/26/2019, 11:08 AM

## 2019-10-26 NOTE — Progress Notes (Addendum)
Subjective No acute events. Feeling well from belly standpoint - distention has resolved. Denies any abdominal pain. Multiple large bm. Complains of new rash over pubic area concerned for herpes  Objective: Vital signs in last 24 hours: Temp:  [98.2 F (36.8 C)-98.4 F (36.9 C)] 98.2 F (36.8 C) (10/30 0358) Pulse Rate:  [89-95] 89 (10/30 0358) Resp:  [18] 18 (10/30 0358) BP: (131-133)/(72-86) 131/72 (10/30 0358) SpO2:  [84 %-97 %] 95 % (10/30 0802) Last BM Date: 10/25/19  Intake/Output from previous day: 10/29 0701 - 10/30 0700 In: 650 [P.O.:600; IV Piggyback:50] Out: 1600 [Urine:1600] Intake/Output this shift: No intake/output data recorded.  Gen: NAD, comfortable CV: RRR Pulm: Normal work of breathing Abd: Soft, NT/ND Ext: SCDs in place  Lab Results: CBC  Recent Labs    10/24/19 0243 10/25/19 0404  WBC 10.3 12.3*  HGB 9.7* 8.2*  HCT 28.3* 25.0*  PLT 422* 417*   BMET Recent Labs    10/25/19 0404 10/26/19 0421  NA 141 138  K 3.5 2.9*  CL 100 98  CO2 29 29  GLUCOSE 99 103*  BUN 25* 21  CREATININE 0.77 0.78  CALCIUM 8.1* 8.2*   PT/INR No results for input(s): LABPROT, INR in the last 72 hours. ABG No results for input(s): PHART, HCO3 in the last 72 hours.  Invalid input(s): PCO2, PO2  Studies/Results:  Anti-infectives: Anti-infectives (From admission, onward)   Start     Dose/Rate Route Frequency Ordered Stop   10/24/19 1400  piperacillin-tazobactam (ZOSYN) IVPB 3.375 g     3.375 g 12.5 mL/hr over 240 Minutes Intravenous Every 8 hours 10/24/19 1342     10/23/19 0600  ceFAZolin (ANCEF) IVPB 2g/100 mL premix  Status:  Discontinued     2 g 200 mL/hr over 30 Minutes Intravenous On call to O.R. 10/23/19 0537 10/23/19 0544   10/22/19 2030  ceFAZolin (ANCEF) IVPB 2g/100 mL premix     2 g 200 mL/hr over 30 Minutes Intravenous Every 6 hours 10/22/19 1756 10/23/19 1423   10/22/19 1330  ceFAZolin (ANCEF) IVPB 2g/100 mL premix    Note to Pharmacy:  Anesthesia to give preop   2 g 200 mL/hr over 30 Minutes Intravenous  Once 10/21/19 1853 10/22/19 1454       Assessment/Plan: Patient Active Problem List   Diagnosis Date Noted  . Hyperkalemia 10/19/2019  . Hyponatremia 10/19/2019  . Pathologic intertrochanteric fracture, right, initial encounter ([place]) 10/17/2019  . Acute bronchitis 12/15/2018  . Encounter for antineoplastic chemotherapy 04/27/2018  . Prostate cancer ([place]) 01/19/2018  . Multiple pulmonary nodules 10/28/2017  . COPD (chronic obstructive pulmonary disease) ([place]) 10/18/2017  . Tobacco use 10/18/2017   s/p Procedure(s): RIGHT INTERTROCHANTERIC INTRAMEDULLARY (IM) NAIL 10/22/2019  Regular diet as tolerated Will defer to medicine team for potential herpetic flare over pubic area Ambulate 5x/day Naloxegol for opioid induced constipation prevention; minimize narcotics Miralax BID We will sign off for now - please let us know if questions or concerns arise   LOS: 7 days   Sharon Mt. Dema Severin, M.D. [facility] Use [email or web address] to contact on call provider

## 2019-10-26 NOTE — Plan of Care (Signed)
  Problem: Education: Goal: Knowledge of General Education information will improve Description: Including pain rating scale, medication(s)/side effects and non-pharmacologic comfort measures Outcome: Progressing   Problem: Health Behavior/Discharge Planning: Goal: Ability to manage health-related needs will improve Outcome: Progressing   Problem: Clinical Measurements: Goal: Ability to maintain clinical measurements within normal limits will improve Outcome: Progressing   Problem: Activity: Goal: Risk for activity intolerance will decrease Outcome: Progressing   Problem: Nutrition: Goal: Adequate nutrition will be maintained Outcome: Progressing   Problem: Elimination: Goal: Will not experience complications related to urinary retention Outcome: Progressing   Problem: Safety: Goal: Ability to remain free from injury will improve Outcome: Progressing

## 2019-10-27 LAB — BASIC METABOLIC PANEL
Anion gap: 10 (ref 5–15)
BUN: 14 mg/dL (ref 8–23)
CO2: 25 mmol/L (ref 22–32)
Calcium: 8.2 mg/dL — ABNORMAL LOW (ref 8.9–10.3)
Chloride: 101 mmol/L (ref 98–111)
Creatinine, Ser: 0.69 mg/dL (ref 0.61–1.24)
GFR calc Af Amer: >60 mL/min (ref >60)
GFR calc non Af Amer: >60 mL/min (ref >60)
Glucose, Bld: 116 mg/dL — ABNORMAL HIGH (ref 70–99)
Potassium: 3.6 mmol/L (ref 3.5–5.1)
Sodium: 136 mmol/L (ref 135–145)

## 2019-10-27 MED ORDER — TIOTROPIUM BROMIDE MONOHYDRATE 18 MCG IN CAPS
18.0000 ug | ORAL_CAPSULE | Freq: Every day | RESPIRATORY_TRACT | Status: DC
  Filled 2019-10-27: qty 5

## 2019-10-27 MED ORDER — POLYETHYLENE GLYCOL 3350 17 G PO PACK
17.0000 g | PACK | Freq: Every day | ORAL | Status: DC
  Administered 2019-10-30: 11:00:00 17 g via ORAL
  Filled 2019-10-27 (×3): qty 1

## 2019-10-27 MED ORDER — UMECLIDINIUM BROMIDE 62.5 MCG/INH IN AEPB
1.0000 | INHALATION_SPRAY | Freq: Every day | RESPIRATORY_TRACT | Status: DC
  Administered 2019-10-29 – 2019-10-30 (×2): 1 via RESPIRATORY_TRACT
  Filled 2019-10-27: qty 7

## 2019-10-27 NOTE — Plan of Care (Signed)
  Problem: Elimination: Goal: Will not experience complications related to bowel motility Outcome: Progressing Goal: Will not experience complications related to urinary retention Outcome: Progressing   

## 2019-10-27 NOTE — Plan of Care (Signed)
  Problem: Education: Goal: Knowledge of General Education information will improve Description: Including pain rating scale, medication(s)/side effects and non-pharmacologic comfort measures Outcome: Progressing   Problem: Health Behavior/Discharge Planning: Goal: Ability to manage health-related needs will improve Outcome: Progressing   Problem: Activity: Goal: Risk for activity intolerance will decrease Outcome: Progressing   Problem: Elimination: Goal: Will not experience complications related to urinary retention Outcome: Progressing   Problem: Safety: Goal: Ability to remain free from injury will improve Outcome: Progressing   Problem: Skin Integrity: Goal: Risk for impaired skin integrity will decrease Outcome: Progressing

## 2019-10-27 NOTE — Progress Notes (Signed)
PROGRESS NOTE    Anthony Escobar  GEX:[id number] DOB: 17-Jun-1951 DOA: 10/19/2019 PCP: [name], MD  Brief Narrative: Anthony Escobar is a 68 y.o. 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  He saw Dr. Erlinda Hong  10/17/2019 as MRI revealed pathological  intertrochanteric fracture of the right hip.  He was planning to have active surgical stabilization on Monday, 10/22/2019.  However after lifting a big potted plant his pain got worse and he decided to come to the ER. He also complained of constipation on admission, -Underwent ORIF by Dr. Erlinda Hong on 10/26 -Postoperative course was complicated by ileus, with severe nausea vomiting, CT could not rule out small bowel obstruction, -Improving with conservative management, required NG decompression, bowel rest  Assessment & Plan:   Ileus versus small bowel obstruction -CT abdomen pelvis concerning for possible small bowel obstruction with multiple air-fluid loops, transition point near right lower quadrant, also bowel wall edema noted -improved with bowel rest, NG decompression -Appreciate general surgery input,, tolerating soft diet now -Continue to increase activity and ambulation as tolerated, discontinued IV Zosyn Discharge planning, social work consulted for rehab  Pathologic intertrochanteric fracture, right, initial encounter [facility]) -Status post ORIF 10/26 -Orthopedics following, Lovenox for DVT prophylaxis -Physical therapy   Prostate cancer metastatic to bones and lungs -diagnosed in 2019 completed 20 cycles of Taxotere chemotherapy May 2019 followed by Lupron and Gillermina Phy now with right hip pathological fracture. -Good functional status at baseline, followed closely by Dr. Alen Blew, per last oncology note appears to be  responding well to treatment -Restarted Xtandi  Normocytic anemia -Due to cancer, chemotherapy, worsened in the setting of hemodilution and postop status -Monitor  Acute hypoxic respiratory failure Small bilateral pleural effusions -In the setting of postop fluid administration, IV fluids discontinued -Lasix 20 mg x1 today -May also have an element of COPD from 71 to 40 years of tobacco abuse -Wean off O2 as tolerated, incentive spirometer  Hyponatremia -Improved with hydration  Hypokalemia -Replaced  COPD -Stable no wheezing, nebs as needed  DVT prophylaxis: Lovenox Code Status: Full code Family Communication: No family at bedside, I called and updated his granddaughter 10/28 Disposition Plan: Plan for SNF at discharge  Consultants:   CCS  Ortho   Procedures: ORIF by Dr. Erlinda Hong on 10/26  Antimicrobials:    Subjective: -c/o abd rumbling, denies any dyspnea today  Objective: Vitals:   10/27/19 0726 10/27/19 0748 10/27/19 1000 10/27/19 1204  BP:  (!) 151/89  (!) 152/89  Pulse: 90 94  90  Resp: 18 18  18   Temp:  98.2 F (36.8 C)  98.4 F (36.9 C)  TempSrc:  Oral  Oral  SpO2: 97% 98% 98% 96%  Weight:      Height:        Intake/Output Summary (Last 24 hours) at 10/27/2019 1228 Last data filed at 10/27/2019 1000 Gross per 24 hour  Intake 480 ml  Output 1600 ml  Net -1120 ml   Filed Weights   10/19/19 1217  Weight: 71.2 kg    Examination:  Gen: Awake, Alert, Oriented X 3, no distress HEENT: PERRLA, Neck supple, no JVD Lungs: decreased BS at bases CVS: RRR,No Gallops,Rubs or new Murmurs Abd: soft, Non tender, less distended, BS increased Extremities: R hip with dressing, swelling Skin: no new rashes  Data Reviewed:   CBC: Recent Labs  Lab 10/21/19 0625 10/22/19 1930 10/23/19 0345 10/24/19 0243 10/25/19 0404  WBC 14.9* 14.9* 11.1* 10.3 12.3*  NEUTROABS  --   --  9.1*  --   --   HGB 13.9 10.0* 9.8* 9.7* 8.2*  HCT 41.3 29.6* 29.1* 28.3*  25.0*  MCV 100.7* 99.7 101.0* 97.9 102.5*  PLT 391 405* 425* 422* 676*   Basic Metabolic Panel: Recent Labs  Lab 10/22/19 1940 10/23/19 0345 10/24/19 0243 10/25/19 0404 10/26/19 0421  NA 133* 134* 136 141 138  K 4.0 4.2 4.4 3.5 2.9*  CL 101 101 98 100 98  CO2 22 24 27 29 29   GLUCOSE 134* 131* 131* 99 103*  BUN 21 18 20  25* 21  CREATININE 0.75 0.72 0.76 0.77 0.78  CALCIUM 8.5* 8.3* 8.6* 8.1* 8.2*  MG  --   --   --  2.0  --   PHOS  --  2.3* 3.3  --   --    GFR: Estimated Creatinine Clearance: 85.5 mL/min (by C-G formula based on SCr of 0.78 mg/dL). Liver Function Tests: Recent Labs  Lab 10/21/19 0625 10/23/19 0345 10/24/19 0243  AST 43*  --   --   ALT 13  --   --   ALKPHOS 197*  --   --   BILITOT 0.7  --   --   PROT 6.4*  --   --   ALBUMIN 3.0* 2.1* 2.2*   No results for input(s): LIPASE, AMYLASE in the last 168 hours. No results for input(s): AMMONIA in the last 168 hours. Coagulation Profile: No results for input(s): INR, PROTIME in the last 168 hours. Cardiac Enzymes: No results for input(s): CKTOTAL, CKMB, CKMBINDEX, TROPONINI in the last 168 hours. BNP (last 3 results) No results for input(s): PROBNP in the last 8760 hours. HbA1C: No results for input(s): HGBA1C in the last 72 hours. CBG: No results for input(s): GLUCAP in the last 168 hours. Lipid Profile: No results for input(s): CHOL, HDL, LDLCALC, TRIG, CHOLHDL, LDLDIRECT in the last 72 hours. Thyroid Function Tests: No results for input(s): TSH, T4TOTAL, FREET4, T3FREE, THYROIDAB in the last 72 hours. Anemia Panel: No results for input(s): VITAMINB12, FOLATE, FERRITIN, TIBC, IRON, RETICCTPCT in the last 72 hours. Urine analysis:    Component Value Date/Time   COLORURINE YELLOW 10/19/2019 1609   APPEARANCEUR CLEAR 10/19/2019 1609   LABSPEC 1.011 10/19/2019 1609   PHURINE 5.0 10/19/2019 1609   GLUCOSEU NEGATIVE 10/19/2019 1609   HGBUR NEGATIVE 10/19/2019 1609   BILIRUBINUR NEGATIVE 10/19/2019  1609   KETONESUR NEGATIVE 10/19/2019 1609   PROTEINUR NEGATIVE 10/19/2019 1609   NITRITE NEGATIVE 10/19/2019 [place] 10/19/2019 1609   Sepsis Labs: @LABRCNTIP (procalcitonin:4,lacticidven:4)  ) Recent Results (from the past 240 hour(s))  Novel Coronavirus, NAA (Hosp order, Send-out to Ref Lab; TAT 18-24 hrs     Status: None   Collection Time: 10/18/19 11:12 AM   Specimen: Nasopharyngeal Swab; Respiratory  Result Value Ref Range Status   SARS-CoV-2, NAA NOT DETECTED NOT DETECTED Final    Comment: (NOTE) This nucleic acid amplification test was developed and its performance characteristics determined by [employer]. Nucleic acid amplification tests include PCR and TMA. This test has not been FDA cleared or approved. This test has been authorized by FDA under an Emergency Use Authorization (EUA). This test is only authorized for the duration of time the declaration that circumstances exist justifying the authorization of the emergency use of  in vitro diagnostic tests for detection of SARS-CoV-2 virus and/or diagnosis of COVID-19 infection under section 564(b)(1) of the Act, 21 U.S.C. 791TAV-6(P) (1), unless the authorization is terminated or revoked sooner. When diagnostic testing is negative, the possibility of a false negative result should be considered in the context of a patient's recent exposures and the presence of clinical signs and symptoms consistent with COVID-19. An individual without symptoms of COVID- 19 and who is not shedding SARS-CoV-2 vi rus would expect to have a negative (not detected) result in this assay. Performed At: [facility] [address] [place], [place] [id number] Rush Farmer MD VZ:[id number]    [place]  Final    Comment: Performed at [facility], [place] [address], [place], [place] [address]  Surgical pcr screen     Status: None   Collection Time: 10/21/19  5:12 AM    Specimen: Nasal Mucosa; Nasal Swab  Result Value Ref Range Status   MRSA, PCR NEGATIVE NEGATIVE Final   Staphylococcus aureus NEGATIVE NEGATIVE Final    Comment: (NOTE) The Xpert SA Assay (FDA approved for NASAL specimens in patients 35 years of age and older), is one component of a comprehensive surveillance program. It is not intended to diagnose infection nor to guide or monitor treatment. Performed at [facility], [place] [address], [place], [place] [address]          Radiology Studies: No results found.      Scheduled Meds: . Chlorhexidine Gluconate Cloth  6 each Topical Daily  . enoxaparin (LOVENOX) injection  40 mg Subcutaneous Q24H  . enzalutamide  160 mg Oral Daily  . mometasone-formoterol  2 puff Inhalation BID  . naloxegol oxalate  12.5 mg Oral Daily  . [START ON 10/28/2019] polyethylene glycol  17 g Oral Daily  . potassium chloride  40 mEq Oral Daily  . tiotropium  18 mcg Inhalation Daily   Continuous Infusions: . methocarbamol (ROBAXIN) IV       LOS: 8 days    Time spent: 72min    Domenic Polite, MD [facility]   10/27/2019, 12:28 PM

## 2019-10-28 LAB — CBC
HCT: 25.4 % — ABNORMAL LOW (ref 39.0–52.0)
Hemoglobin: 8.4 g/dL — ABNORMAL LOW (ref 13.0–17.0)
MCH: 33.1 pg (ref 26.0–34.0)
MCHC: 33.1 g/dL (ref 30.0–36.0)
MCV: 100 fL (ref 80.0–100.0)
Platelets: 506 10*3/uL — ABNORMAL HIGH (ref 150–400)
RBC: 2.54 MIL/uL — ABNORMAL LOW (ref 4.22–5.81)
RDW: 12.4 % (ref 11.5–15.5)
WBC: 13.1 10*3/uL — ABNORMAL HIGH (ref 4.0–10.5)
nRBC: 0 % (ref 0.0–0.2)

## 2019-10-28 NOTE — Progress Notes (Signed)
PROGRESS NOTE    RAKAN SOFFER  CWC:[id number] DOB: 09/18/1951 DOA: 10/19/2019 PCP: [name], MD  Brief Narrative: Anthony Escobar is a 68 y.o. 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  He saw Dr. Erlinda Hong  10/17/2019 as MRI revealed pathological  intertrochanteric fracture of the right hip.  He was planning to have active surgical stabilization on Monday, 10/22/2019.  However after lifting a big potted plant his pain got worse and he decided to come to the ER. He also complained of constipation on admission, -Underwent ORIF by Dr. Erlinda Hong on 10/26 -Postoperative course was complicated by ileus, with severe nausea vomiting, CT could not rule out small bowel obstruction, -Improving with conservative management, required NG decompression, bowel rest  Assessment & Plan:   Ileus versus small bowel obstruction -CT abdomen pelvis concerning for possible small bowel obstruction with multiple air-fluid loops, transition point near right lower quadrant, also bowel wall edema noted -improved with bowel rest, NG decompression -Appreciate general surgery -This has resolved now, tolerating diet - continue laxatives and Movantik for opioid induced constipation - discharge planning - SNF tomorrow if stable  Pathologic intertrochanteric fracture, right, initial encounter [facility]) -Status post ORIF 10/26 -Orthopedics following, Lovenox for DVT prophylaxis -Physical therapy , plan for rehab [place]  Prostate cancer metastatic to bones and lungs -diagnosed in 2019 completed 20 cycles of Taxotere chemotherapy May 2019 followed by Lupron and Gillermina Phy now with right hip pathological fracture. -Good functional status at baseline, followed closely by Dr. Alen Blew, per last oncology note appears to  be responding well to treatment -Restarted Xtandi  Normocytic anemia -Due to cancer, chemotherapy, worsened in the setting of hemodilution and postop status -Monitor  Acute hypoxic respiratory failure Small bilateral pleural effusions -In the setting of postop fluid administration, IV fluids discontinued - given IV Lasix yesterday, improved weaned off O2 at this time -May also have an element of COPD from 65 to 40 years of tobacco abuse, last CT in March does note emphysema - increase activity, incentive spirometer, monitor  Hyponatremia -Improved with hydration  Hypokalemia -Replaced  COPD -Stable no wheezing, nebs as needed  DVT prophylaxis: Lovenox Code Status: Full code Family Communication: No family at bedside, I called and updated his granddaughter 10/29 Disposition Plan: Plan for SNF at discharge tomorrow  Consultants:   CCS  Ortho   Procedures: ORIF by Dr. Erlinda Hong on 10/26  Antimicrobials:    Subjective: - feels okay, no events overnight, - tolerating diet, dyspnea is improving, positive bowel movement last night  Objective: Vitals:   10/27/19 2036 10/28/19 0255 10/28/19 0758 10/28/19 0800  BP:  (!) 162/92 (!) 151/90   Pulse: 91 90 87 85  Resp: 16  18 18   Temp:  98.1 F (36.7 C) 98.1 F (36.7 C)   TempSrc:  Oral Oral   SpO2: 94% 96% 96% 96%  Weight:      Height:        Intake/Output Summary (Last 24 hours) at 10/28/2019 1134 Last data filed at 10/28/2019 0836 Gross per 24 hour  Intake 480 ml  Output 700 ml  Net -220 ml   Filed Weights   10/19/19 1217  Weight: 71.2 kg    Examination:   alert awake oriented x3, no distress HEENT pupils equal reactive Lungs poor air movement bilaterally  otherwise clear CVS S1-S2 regular rate rhythm Abdomen is soft, less distended nontender, bowel sounds present  extremities:  Right hip with dressing, less swelling today Skin no rashes   Data Reviewed:   CBC: Recent Labs  Lab 10/22/19 1930  10/23/19 0345 10/24/19 0243 10/25/19 0404 10/28/19 0352  WBC 14.9* 11.1* 10.3 12.3* 13.1*  NEUTROABS  --  9.1*  --   --   --   HGB 10.0* 9.8* 9.7* 8.2* 8.4*  HCT 29.6* 29.1* 28.3* 25.0* 25.4*  MCV 99.7 101.0* 97.9 102.5* 100.0  PLT 405* 425* 422* 417* 712*   Basic Metabolic Panel: Recent Labs  Lab 10/23/19 0345 10/24/19 0243 10/25/19 0404 10/26/19 0421 10/27/19 1155  NA 134* 136 141 138 136  K 4.2 4.4 3.5 2.9* 3.6  CL 101 98 100 98 101  CO2 24 27 29 29 25   GLUCOSE 131* 131* 99 103* 116*  BUN 18 20 25* 21 14  CREATININE 0.72 0.76 0.77 0.78 0.69  CALCIUM 8.3* 8.6* 8.1* 8.2* 8.2*  MG  --   --  2.0  --   --   PHOS 2.3* 3.3  --   --   --    GFR: Estimated Creatinine Clearance: 85.5 mL/min (by C-G formula based on SCr of 0.69 mg/dL). Liver Function Tests: Recent Labs  Lab 10/23/19 0345 10/24/19 0243  ALBUMIN 2.1* 2.2*   No results for input(s): LIPASE, AMYLASE in the last 168 hours. No results for input(s): AMMONIA in the last 168 hours. Coagulation Profile: No results for input(s): INR, PROTIME in the last 168 hours. Cardiac Enzymes: No results for input(s): CKTOTAL, CKMB, CKMBINDEX, TROPONINI in the last 168 hours. BNP (last 3 results) No results for input(s): PROBNP in the last 8760 hours. HbA1C: No results for input(s): HGBA1C in the last 72 hours. CBG: No results for input(s): GLUCAP in the last 168 hours. Lipid Profile: No results for input(s): CHOL, HDL, LDLCALC, TRIG, CHOLHDL, LDLDIRECT in the last 72 hours. Thyroid Function Tests: No results for input(s): TSH, T4TOTAL, FREET4, T3FREE, THYROIDAB in the last 72 hours. Anemia Panel: No results for input(s): VITAMINB12, FOLATE, FERRITIN, TIBC, IRON, RETICCTPCT in the last 72 hours. Urine analysis:    Component Value Date/Time   COLORURINE YELLOW 10/19/2019 1609   APPEARANCEUR CLEAR 10/19/2019 1609   LABSPEC 1.011 10/19/2019 1609   PHURINE 5.0 10/19/2019 1609   GLUCOSEU NEGATIVE 10/19/2019 1609   HGBUR  NEGATIVE 10/19/2019 1609   BILIRUBINUR NEGATIVE 10/19/2019 1609   KETONESUR NEGATIVE 10/19/2019 1609   PROTEINUR NEGATIVE 10/19/2019 1609   NITRITE NEGATIVE 10/19/2019 [place] 10/19/2019 1609   Sepsis Labs: @LABRCNTIP (procalcitonin:4,lacticidven:4)  ) Recent Results (from the past 240 hour(s))  Surgical pcr screen     Status: None   Collection Time: 10/21/19  5:12 AM   Specimen: Nasal Mucosa; Nasal Swab  Result Value Ref Range Status   MRSA, PCR NEGATIVE NEGATIVE Final   Staphylococcus aureus NEGATIVE NEGATIVE Final    Comment: (NOTE) The Xpert SA Assay (FDA approved for NASAL specimens in patients 21 years of age and older), is one component of a comprehensive surveillance program. It is not intended to diagnose infection nor to guide or monitor treatment. Performed at [facility], [place] [address] [place], [place] [address]          Radiology Studies: No results found.      Scheduled Meds: . Chlorhexidine Gluconate Cloth  6 each Topical Daily  . enoxaparin (LOVENOX) injection  40 mg Subcutaneous Q24H  . enzalutamide  160 mg Oral Daily  . mometasone-formoterol  2 puff Inhalation BID  . naloxegol oxalate  12.5 mg Oral Daily  . polyethylene glycol  17 g Oral Daily  . potassium chloride  40 mEq Oral Daily  . umeclidinium bromide  1 puff Inhalation Daily   Continuous Infusions: . methocarbamol (ROBAXIN) IV       LOS: 9 days    Time spent: 71min    Domenic Polite, MD [facility]   10/28/2019, 11:34 AM

## 2019-10-28 NOTE — Plan of Care (Signed)

## 2019-10-28 NOTE — Plan of Care (Signed)
  Problem: Pain Managment: Goal: General experience of comfort will improve Outcome: Progressing   Problem: Safety: Goal: Ability to remain free from injury will improve Outcome: Progressing   Problem: Skin Integrity: Goal: Risk for impaired skin integrity will decrease Outcome: Progressing   

## 2019-10-28 NOTE — Plan of Care (Signed)
  Problem: Pain Managment: Goal: General experience of comfort will improve Outcome: Adequate for Discharge

## 2019-10-29 ENCOUNTER — Encounter (HOSPITAL_COMMUNITY): Payer: Self-pay | Admitting: *Deleted

## 2019-10-29 ENCOUNTER — Telehealth: Payer: Self-pay | Admitting: Radiation Oncology

## 2019-10-29 LAB — CBC
HCT: 27.4 % — ABNORMAL LOW (ref 39.0–52.0)
Hemoglobin: 9.2 g/dL — ABNORMAL LOW (ref 13.0–17.0)
MCH: 33.3 pg (ref 26.0–34.0)
MCHC: 33.6 g/dL (ref 30.0–36.0)
MCV: 99.3 fL (ref 80.0–100.0)
Platelets: 607 10*3/uL — ABNORMAL HIGH (ref 150–400)
RBC: 2.76 MIL/uL — ABNORMAL LOW (ref 4.22–5.81)
RDW: 12.6 % (ref 11.5–15.5)
WBC: 13 10*3/uL — ABNORMAL HIGH (ref 4.0–10.5)
nRBC: 0 % (ref 0.0–0.2)

## 2019-10-29 LAB — BASIC METABOLIC PANEL
Anion gap: 9 (ref 5–15)
BUN: 9 mg/dL (ref 8–23)
CO2: 23 mmol/L (ref 22–32)
Calcium: 8.7 mg/dL — ABNORMAL LOW (ref 8.9–10.3)
Chloride: 104 mmol/L (ref 98–111)
Creatinine, Ser: 0.66 mg/dL (ref 0.61–1.24)
GFR calc Af Amer: >60 mL/min (ref >60)
GFR calc non Af Amer: >60 mL/min (ref >60)
Glucose, Bld: 97 mg/dL (ref 70–99)
Potassium: 4.2 mmol/L (ref 3.5–5.1)
Sodium: 136 mmol/L (ref 135–145)

## 2019-10-29 LAB — SARS CORONAVIRUS 2 (TAT 6-24 HRS): SARS Coronavirus 2: NEGATIVE

## 2019-10-29 MED ORDER — POLYETHYLENE GLYCOL 3350 17 G PO PACK: 17.0000 g | PACK | Freq: Every day | ORAL | 0 refills | Status: DC

## 2019-10-29 MED ORDER — AMITRIPTYLINE HCL 50 MG PO TABS
75.0000 mg | ORAL_TABLET | Freq: Every day | ORAL | Status: DC
  Administered 2019-10-29: 75 mg via ORAL
  Filled 2019-10-29: qty 1

## 2019-10-29 NOTE — Telephone Encounter (Signed)
Received voicemail message from patient's daughter, [name], inquiring about her father's appointments on Wednesday and Friday with Dr. Tammi Klippel. Explained the appointment on Wednesday is a scheduling error. Stressed that the rehab facility should delivery her father to [facility] rad onc by 1330 on Friday, November 6th for an appointment with the nurse then pick him back up at 1545 after his planning scan. Reassured her that her father would be well cared for and explained our visitor restriction policy due to COVID 19. She verbalized understanding of all reviewed and expressed appreciation for the return call.

## 2019-10-29 NOTE — Discharge Summary (Addendum)
Physician Discharge Summary  Anthony Escobar OZH:[id number] DOB: July 17, 1951 DOA: 10/19/2019  PCP: [name], MD  Admit date: 10/19/2019 Discharge date: 10/30/2019  Time spent: 35 minutes  Recommendations for Outpatient Follow-up:  1.  continue Lovenox daily for DVT prophylaxis for 4 weeks 2. Orthopedics Dr.Xu  In 7-10 days 3. Oncologist Dr. Alen Blew in 2 weeks   Discharge Diagnoses:  Principal Problem:   Pathologic intertrochanteric fracture, right, initial encounter [facility])   Metastatic prostate cancer    Small  bowel obstruction   Hyperkalemia   Hyponatremia    Normocytic anemia   Discharge Condition:  stable  Diet recommendation:  Regular diet  Filed Weights   10/19/19 1217  Weight: 71.2 kg    History of present illness:  Anthony Escobar a 68 y.o.malewith medical history significant ofcastration resistant prostate cancer with mets to the bone, lungs diagnosed in 2019 completed 6 cycles of Taxotere chemotherapy May 2019 followed by Lupron and Gillermina Phy now admitted with right hip pathologicalfracture. He saw Dr. Xu10/21/2020 as MRI revealed pathological intertrochanteric fracture of the right hip. He was planning to have active surgical stabilization on Monday, 10/22/2019. However after lifting a big potted plant his pain got worse and he decided to come to    Hospital Course:   Ileus versus small bowel obstruction -CT abdomen pelvis concerning for possible small bowel obstruction with multiple air-fluid loops, transition point near right lower quadrant, also bowel wall edema noted - general surgery was consulted -improved with bowel rest, NG decompression - this has resolved now, having multiple bowel movements on laxative regimen, continue this at discharge - increase activity as tolerated - SNF for rehab  Pathologic intertrochanteric fracture, right, initial encounter [facility]) -Status post ORIF 10/26 -Orthopedics following, Lovenox for DVT  prophylaxis for 4 weeks -Physical therapy , plan for rehab  today  Prostate cancer metastatic to bones and lungs -diagnosed in 2019 completed 20 cycles of Taxotere chemotherapy May 2019 followed by Lupron and Gillermina Phy now with right hip pathologicalfracture. -Good functional status at baseline, followed closely by Dr. Alen Blew, per last oncology note appears to be responding well to treatment -Restarted Xtandi - follow-up with oncology in 2 weeks  Normocytic anemia -Due to cancer, chemotherapy, worsened in the setting of hemodilution and postop status - stable  Acute hypoxic respiratory failure Small bilateral pleural effusions -In the setting of postop fluid administration, IV fluids discontinued - given IV Lasix 2 days ago, improved weaned off O2 at this time -May also have an element of COPD from 76 to 40 years of tobacco abuse, last CT in March does note emphysema - increase activity, incentive spirometer, monitor  Hyponatremia -Improved with hydration  Hypokalemia -Replaced  COPD -Stable no wheezing, nebs as needed  Consultants:   [facility]  Ortho   Procedures: ORIF by Dr. Erlinda Hong on 10/26   Discharge Exam: Vitals:   10/29/19 0814 10/29/19 0819  BP:    Pulse:    Resp:    Temp:    SpO2: 97% 97%    General: alert awake oriented x3, no distress Cardiovascular:  S1-S2, regular rate rhythm Respiratory:  clear  Discharge Instructions   Discharge Instructions    Diet - low sodium heart healthy   Complete by: As directed    Increase activity slowly   Complete by: As directed    Weight bearing as tolerated   Complete by: As directed      Allergies as of 10/29/2019   No Known Allergies  Medication List    STOP taking these medications   HYDROcodone-acetaminophen 5-325 MG tablet Commonly known as: Norco   naproxen sodium 220 MG tablet Commonly known as: ALEVE     TAKE these medications   albuterol 108 (90 Base) MCG/ACT inhaler Commonly known as:  ProAir HFA Inhale 2 puffs into the lungs every 4 (four) hours as needed for wheezing or shortness of breath.   amitriptyline 75 MG tablet Commonly known as: ELAVIL Take 75 mg by mouth at bedtime.   atorvastatin 20 MG tablet Commonly known as: LIPITOR Take 20 mg by mouth daily.   budesonide-formoterol 160-4.5 MCG/ACT inhaler Commonly known as: SYMBICORT Inhale 2 puffs into the lungs 2 (two) times daily.   COQ10 PO Take 1 tablet by mouth daily.   enoxaparin 40 MG/0.4ML injection Commonly known as: LOVENOX Inject 0.4 mLs (40 mg total) into the skin daily.   lisinopril 20 MG tablet Commonly known as: ZESTRIL Take 20 mg by mouth daily.   oxyCODONE-acetaminophen 5-325 MG tablet Commonly known as: Percocet Take 1-2 tablets by mouth every 8 (eight) hours as needed for severe pain. What changed: Another medication with the same name was added. Make sure you understand how and when to take each.   oxyCODONE-acetaminophen 5-325 MG tablet Commonly known as: Percocet Take 1-2 tablets by mouth every 8 (eight) hours as needed for severe pain. What changed: You were already taking a medication with the same name, and this prescription was added. Make sure you understand how and when to take each.   polyethylene glycol 17 g packet Commonly known as: MIRALAX / GLYCOLAX Take 17 g by mouth daily.   prochlorperazine 10 MG tablet Commonly known as: COMPAZINE Take 1 tablet (10 mg total) by mouth every 6 (six) hours as needed for nausea or vomiting.   Tiotropium Bromide Monohydrate 2.5 MCG/ACT Aers Commonly known as: Spiriva Respimat Inhale 2 puffs into the lungs daily.   TURMERIC PO Take 2 tablets by mouth daily.   Xtandi 40 MG capsule Generic drug: enzalutamide Take 4 capsules (160 mg total) by mouth daily.            Discharge Care Instructions  (From admission, onward)         Start     Ordered   10/22/19 0000  Weight bearing as tolerated     10/22/19 1547          No Known Allergies Follow-up Information    Leandrew Koyanagi, MD In 2 weeks.   Specialty: Orthopedic Surgery Why: For wound re-check, For suture removal Contact information: [place] [address] [phone]        [name], MD. Schedule an appointment as soon as possible for a visit in 1 week(s).   Specialty: Family Medicine Contact information: [place] [address] [phone]            The results of significant diagnostics from this hospitalization (including imaging, microbiology, ancillary and laboratory) are listed below for reference.    Significant Diagnostic Studies: Dg Chest 1 View  Result Date: 10/19/2019 CLINICAL DATA:  Leukocytosis. EXAM: CHEST  1 VIEW COMPARISON:  October 18, 2017. FINDINGS: The heart size and mediastinal contours are within normal limits. Pulmonary nodules noted on prior radiograph are not well visualized currently. No consolidative process is noted. The visualized skeletal structures are unremarkable. IMPRESSION: No active disease. Electronically Signed   By: Bobbe Medico.D.  On: 10/19/2019 15:54   Mr Hip Right W Wo Contrast  Result Date: 10/16/2019 CLINICAL DATA:  Prostate cancer, continued hip pain EXAM: MRI OF THE RIGHT HIP WITHOUT AND WITH CONTRAST TECHNIQUE: Multiplanar, multisequence MR imaging was performed both before and after administration of intravenous contrast. CONTRAST:  7.110mL GADAVIST GADOBUTROL 1 MMOL/ML IV SOLN COMPARISON:  CT February 28, 2019 FINDINGS: Bones: There is extensive T1 heterogeneous L/T2 bright multilocular cystic lytic lesion seen throughout the right femoral head, neck, greater and lesser trochanters as well as the proximal femur. Multiple fluid fluid levels are seen, likely blood products layering within the cystic lesions. There does appear to be areas of enhancement within the lesions. Cortical breakthrough seen along the  posterior greater trochanter and along the lateral surface of the greater trochanter. There is enhancement involving the synovium. There is a small femoroacetabular joint effusion. No definite fracture seen. There is increased marrow signal seen within the proximal femur, likely marrow edema. There does appear to be a small subchondral cystic lesion in the superior right acetabulum which does show enhancement, and could represent a focus of disease. No other osseous lesions are identified. There is moderate bilateral hip osteoarthritis with superior joint space loss and small subchondral cystic changes. The visualized bony pelvis appears normal. The visualized sacroiliac joints and symphysis pubis appear normal. Articular cartilage and labrum Articular cartilage: There is diffuse chondral thinning seen within the bilateral femoroacetabular joints. Labrum: There is no gross labral tear or paralabral abnormality. Joint or bursal effusion Joint effusion: A small joint effusion is seen at the right femoroacetabular joint with diffuse surrounding synovial enhancement. Bursae: No focal periarticular fluid collection. Muscles and tendons Muscles and tendons: There is increased signal with enhancement seen within the gluteal musculature and at the iliopsoas insertion site. The there is increased intrasubstance signal seen at the iliopsoas, gluteal, and hamstrings insertion site. Other findings Miscellaneous: There is a heterogeneous appearing prostate gland. Scattered colonic diverticula are noted. The remainder of the deep pelvis is grossly unremarkable. IMPRESSION: 1. Multi lobular extensive enhancing lytic lesions throughout the right femoral head, neck, greater and lesser trochanters extending into the proximal femoral shaft, consistent with the patient's known metastatic disease. There are multiple air fluid fluid levels with areas of cortical breakthrough seen along the greater trochanter. 2. Diffuse marrow edema  within the adjacent femoral head and proximal femoral shaft. No definite fracture however seen. 3. Small enhancing cystic lesion in the superior acetabulum which likely also represent a focus of metastatic disease 4. Moderate bilateral hip osteoarthritis. Electronically Signed   By: Prudencio Pair M.D.   On: 10/16/2019 19:23   Ct Abdomen Pelvis W Contrast  Result Date: 10/24/2019 CLINICAL DATA:  Acute generalized abdominal pain and fever. Postop hip surgery with diffuse abdominal pain, distention and fever. History of prostate cancer. EXAM: CT ABDOMEN AND PELVIS WITH CONTRAST TECHNIQUE: Multidetector CT imaging of the abdomen and pelvis was performed using the standard protocol following bolus administration of intravenous contrast. CONTRAST:  154mL OMNIPAQUE IOHEXOL 300 MG/ML  SOLN COMPARISON:  02/28/2019, 10/03/2013 FINDINGS: Lower chest: Small bilateral pleural effusions with posterior bibasilar atelectasis right worse than left. Calcified plaque over the left anterior descending coronary artery as well as calcified plaque over the distal descending thoracic aorta. Hepatobiliary: Liver, gallbladder and biliary tree are normal. Pancreas: Normal. Spleen: Normal. Adrenals/Urinary Tract: Adrenal glands are normal. Kidneys are normal in size without hydronephrosis or nephrolithiasis. 1.5 cm cyst over the mid pole left kidney. Several other smaller sub  cm bilateral cortical hypodensities too small to characterize but likely cysts. Ureters and bladder are normal. Foley catheter is present within a decompressed bladder. Stomach/Bowel: Stomach is mildly distended. There are multiple air and fluid-filled dilated small bowel loops. Terminal ileum is normal. Mild wall thickening of an ileal loop over the right lower quadrant as this may represent a transition point. Findings may be due to a regional enteritis of infectious or inflammatory nature. Cecum is just right of midline in the mid abdomen. Appendix is normal. Air  and stool present throughout the colon. Minimal diverticulosis of the sigmoid colon. Vascular/Lymphatic: Moderate calcified plaque over the abdominal aorta. No adenopathy. Reproductive: Region of the prostate is smaller with 2 cm rounded hypodense focus over the left side of the prostate. Other: Small amount of patchy free fluid within the abdomen/pelvis. No free peritoneal air. Small right inguinal hernia containing only peritoneal fat. Musculoskeletal: Evidence of patient's right hip fracture post internal fixation with intramedullary nail and associated 2 screws bridging the fracture site into the femoral head intact. Postoperative changes in the soft tissues of the right hip compatible patient's recent surgery. Subtle air-fluid level within the subcutaneous fat adjacent the lateral aspect of the right gluteus maximus likely postoperative change although developing infection is possible. Mild degenerate change of the hips and spine. Mild compression deformity over the superior endplate of L1 new since the previous exam with possible early depression of the superior endplate of L2. IMPRESSION: 1. Multiple air and fluid-filled dilated small bowel loops with possible transition point over the right lower quadrant over the ileum with there is a thick-walled ileal loop. Patchy free fluid in the abdomen/pelvis. Findings likely due to small bowel obstruction with possible regional enteritis involving and ileal loop in the right lower quadrant. Postoperative ileus less likely. 2. Small bilateral pleural effusions with associated bibasilar atelectasis right worse than left. Infection in the right base is possible. 3. Fixation of right hip fracture with hardware intact. Postoperative changes over the soft tissues of the right hip compatible with patient's recent surgery. Subtle air-fluid level over the soft tissues of the surgical bed likely postoperative although developing infection is possible. 4. Compression deformity  involving the superior endplate of L1 new since the previous exam with possible early depression of the superior endplate of L2. 5. Bilateral renal cysts. 6. Prostate gland smaller than seen previously with indeterminate 2 cm hypodense focus along the left side of the prostate in this patient with known prostate cancer. 7.  Diverticulosis of the colon. 8.  Aortic Atherosclerosis (ICD10-I70.0). 9.  Right inguinal hernia containing only peritoneal fat. Electronically Signed   By: Marin Olp M.D.   On: 10/24/2019 10:26   Dg Abd Portable 1v-small Bowel Obstruction Protocol-initial, 8 Hr Delay  Result Date: 10/25/2019 CLINICAL DATA:  Follow up small bowel obstruction EXAM: PORTABLE ABDOMEN - 1 VIEW COMPARISON:  10/24/2019 FINDINGS: Gastric catheter is noted coiled within the stomach. Multiple gas distended loops large and small bowel are noted consistent with small bowel obstruction. The overall appearance is stable from the prior exam. IMPRESSION: Stable small bowel obstruction. Colonic gas is seen indicating a partial obstruction. Electronically Signed   By: Inez Catalina M.D.   On: 10/25/2019 03:43   Dg Abd Portable 1v  Result Date: 10/24/2019 CLINICAL DATA:  Nasogastric tube placement EXAM: PORTABLE ABDOMEN - 1 VIEW COMPARISON:  10/24/2019 CT FINDINGS: Nasogastric tube courses below the diaphragm with distal tip coiled within the region of the gastric body/fundus. Multiple dilated  air-filled loops of small bowel are again noted throughout the abdomen, similar to recent CT. IMPRESSION: 1. Satisfactory positioning of NG tube. 2. Findings of small-bowel obstruction. Electronically Signed   By: Davina Poke M.D.   On: 10/24/2019 16:39   Dg Abd Portable 1v  Result Date: 10/21/2019 CLINICAL DATA:  Abdominal pain and distension EXAM: PORTABLE ABDOMEN - 1 VIEW COMPARISON:  None. FINDINGS: There is moderate stool in the colon. There is no appreciable bowel dilatation or air-fluid level to suggest bowel  obstruction. No free air. Air is noted within the rectum. There are apparent phleboliths in the pelvis. Visualized lung bases are clear. IMPRESSION: Moderate stool in colon. There is air throughout much of the bowel without bowel dilatation. Bowel obstruction not felt to be likely. No free air. Electronically Signed   By: Lowella Grip III M.D.   On: 10/21/2019 10:28   Dg C-arm 1-60 Min  Result Date: 10/22/2019 CLINICAL DATA:  ORIF of right femur fracture. EXAM: RIGHT FEMUR 2 VIEWS; DG C-ARM 1-60 MIN COMPARISON:  Radiographs 10/19/2019. MRI 10/16/2019. FINDINGS: Four spot fluoroscopic images demonstrate ORIF of the pathologic fracture involving the intertrochanteric region of the right femur using a dynamic compression screw and intramedullary rod. The hardware is well positioned. There is near anatomic reduction of the fracture. No demonstrated complications. IMPRESSION: Near anatomic reduction of pathologic fracture of the proximal right femur status post ORIF. Electronically Signed   By: Richardean Sale M.D.   On: 10/22/2019 17:30   Dg Hip Unilat With Pelvis 1v Right  Result Date: 10/19/2019 CLINICAL DATA:  Right hip pain EXAM: DG HIP (WITH OR WITHOUT PELVIS) 1V RIGHT COMPARISON:  MRI 10/16/2019, CT 02/28/2019 FINDINGS: Mixed lytic and sclerotic lesion involving the right femoral head, neck, trochanter and proximal shaft. Acute slightly impacted appearing fracture involving the base of the femoral neck. No femoral head dislocation. Left hip demonstrates normal alignment. IMPRESSION: Mixed lytic and sclerotic lesion involving the proximal right femur, with evidence of acute mildly impacted fracture involving the base of the femoral neck, consistent with pathologic fracture. Electronically Signed   By: Donavan Foil M.D.   On: 10/19/2019 15:22   Dg Femur, Min 2 Views Right  Result Date: 10/22/2019 CLINICAL DATA:  ORIF of right femur fracture. EXAM: RIGHT FEMUR 2 VIEWS; DG C-ARM 1-60 MIN  COMPARISON:  Radiographs 10/19/2019. MRI 10/16/2019. FINDINGS: Four spot fluoroscopic images demonstrate ORIF of the pathologic fracture involving the intertrochanteric region of the right femur using a dynamic compression screw and intramedullary rod. The hardware is well positioned. There is near anatomic reduction of the fracture. No demonstrated complications. IMPRESSION: Near anatomic reduction of pathologic fracture of the proximal right femur status post ORIF. Electronically Signed   By: Richardean Sale M.D.   On: 10/22/2019 17:30    Microbiology: Recent Results (from the past 240 hour(s))  Surgical pcr screen     Status: None   Collection Time: 10/21/19  5:12 AM   Specimen: Nasal Mucosa; Nasal Swab  Result Value Ref Range Status   MRSA, PCR NEGATIVE NEGATIVE Final   Staphylococcus aureus NEGATIVE NEGATIVE Final    Comment: (NOTE) The Xpert SA Assay (FDA approved for NASAL specimens in patients 14 years of age and older), is one component of a comprehensive surveillance program. It is not intended to diagnose infection nor to guide or monitor treatment. Performed at [facility], [place] [address] [place], [place] [address]      Labs: Basic Metabolic Panel: Recent  Labs  Lab 10/23/19 0345 10/24/19 0243 10/25/19 0404 10/26/19 0421 10/27/19 1155 10/29/19 0526  NA 134* 136 141 138 136 136  K 4.2 4.4 3.5 2.9* 3.6 4.2  CL 101 98 100 98 101 104  CO2 24 27 29 29 25 23   GLUCOSE 131* 131* 99 103* 116* 97  BUN 18 20 25* 21 14 9   CREATININE 0.72 0.76 0.77 0.78 0.69 0.66  CALCIUM 8.3* 8.6* 8.1* 8.2* 8.2* 8.7*  MG  --   --  2.0  --   --   --   PHOS 2.3* 3.3  --   --   --   --    Liver Function Tests: Recent Labs  Lab 10/23/19 0345 10/24/19 0243  ALBUMIN 2.1* 2.2*   No results for input(s): LIPASE, AMYLASE in the last 168 hours. No results for input(s): AMMONIA in the last 168 hours. CBC: Recent Labs  Lab 10/23/19 0345 10/24/19 0243 10/25/19 0404 10/28/19 0352  10/29/19 0526  WBC 11.1* 10.3 12.3* 13.1* 13.0*  NEUTROABS 9.1*  --   --   --   --   HGB 9.8* 9.7* 8.2* 8.4* 9.2*  HCT 29.1* 28.3* 25.0* 25.4* 27.4*  MCV 101.0* 97.9 102.5* 100.0 99.3  PLT 425* 422* 417* 506* 607*   Cardiac Enzymes: No results for input(s): CKTOTAL, CKMB, CKMBINDEX, TROPONINI in the last 168 hours. BNP: BNP (last 3 results) No results for input(s): BNP in the last 8760 hours.  ProBNP (last 3 results) No results for input(s): PROBNP in the last 8760 hours.  CBG: No results for input(s): GLUCAP in the last 168 hours.     Signed:  Domenic Polite MD.  [facility] 10/29/2019, 10:57 AM

## 2019-10-29 NOTE — Care Management Important Message (Signed)
Important Message  Patient Details  Name: Anthony Escobar MRN: [id number] Date of Birth: Aug 23, 1951   Medicare Important Message Given:  Yes     Memory Argue 10/29/2019, 3:31 PM

## 2019-10-29 NOTE — Progress Notes (Signed)
Physical Therapy Treatment Patient Details Name: Anthony Escobar MRN: [id number] DOB: July 12, 1951 Today's Date: 10/29/2019    History of Present Illness Pt is a 68 yo male presenting s/p IM nail placement 10/26 for a R intertrochanteric hip fx. PMH sig for prostate cancer with bone mets, s/p 6 cycles of chemo in may 2019 with plans to begin radiation therapy 1 wk following surgery.    PT Comments    Pt was up and ambulating around his room upon PT arrival, but eager to keep walking with PT. Pt was educated on importance of notifying staff when he wants to ambulate or go to the bathroom for improved safety, and pt was agreeable. Pt was able to demo sig improved transfers, and mobility with less pain and sig better maintenance of O2 saturation with mobility today. The patient completed a 5xsit-to-stand (5XSTS) in 26s with BUE and use of RW. This is above the age-based cut-off of 11.4s and therefore indicates they are still at increased risk for falls. The pt reported modified BORG RPE of 5/10 following STS and required seated rest break of ~1 min to catch his breath (no change in O2 sat). The patient will therefore continue to benefit from skilled PT to address limitations in functional strength, endurance, and mobility.     Follow Up Recommendations  SNF;Supervision/Assistance - 24 hour     Equipment Recommendations  Rolling walker with 5" wheels;3in1 (PT)    Recommendations for Other Services       Precautions / Restrictions Precautions Precautions: Fall Restrictions Weight Bearing Restrictions: Yes RLE Weight Bearing: Weight bearing as tolerated    Mobility  Bed Mobility               General bed mobility comments: pt OOB upon arrival  Transfers Overall transfer level: Needs assistance Equipment used: Rolling walker (2 wheeled)   Sit to Stand: Modified independent (Device/Increase time)         General transfer comment: pt up walking independently upon PT arrival,  able to stand/sit from recliner with mod-I due to use of RW. Pt able to demo 5xSTS with use of UE and RW but no assist needed  Ambulation/Gait Ambulation/Gait assistance: Supervision Gait Distance (Feet): 40 Feet Assistive device: Rolling walker (2 wheeled) Gait Pattern/deviations: Step-to pattern;Decreased step length - right;Decreased step length - left;Decreased dorsiflexion - right;Decreased dorsiflexion - left   Gait velocity interpretation: <1.31 ft/sec, indicative of household ambulator General Gait Details: poor clearance bilaterally with no heel-toe pattern. Pt ambulates slowly but with improved weight acceptance on RLE   Stairs             Wheelchair Mobility    Modified Rankin (Stroke Patients Only)       Balance Overall balance assessment: Needs assistance Sitting-balance support: Feet supported Sitting balance-Leahy Scale: Good     Standing balance support: Single extremity supported Standing balance-Leahy Scale: Fair Standing balance comment: Pt able to complete peri-care with only 1 UE for balance dueing dynamic standing                            Cognition Arousal/Alertness: Awake/alert Behavior During Therapy: WFL for tasks assessed/performed;Anxious Overall Cognitive Status: Within Functional Limits for tasks assessed                                 General Comments: Pt benefits from additional VCs for  sequencing      Exercises Total Joint Exercises Long Arc Quad: AROM;Both;10 reps;Seated(instructed to complete 3 x 10 3x/day) Other Exercises Other Exercises: seated marches, B x10; instructed to complete 3 x 10 3x/day Other Exercises: seated calf raises, B x 10; instructed to complete 3 x 10 3x/day    General Comments        Pertinent Vitals/Pain Pain Assessment: No/denies pain(no pain with amb, slight increase initially with exercises, decreased with reps) Pain Location: RLE with mobility, pt reports improved with  continued ambulation Pain Descriptors / Indicators: Grimacing;Sore Pain Intervention(s): Limited activity within patient's tolerance;Monitored during session;Repositioned    Home Living                      Prior Function            PT Goals (current goals can now be found in the care plan section) Acute Rehab PT Goals Patient Stated Goal: to return home PT Goal Formulation: With patient Time For Goal Achievement: 11/06/19 Potential to Achieve Goals: Good Progress towards PT goals: Progressing toward goals    Frequency    Min 3X/week      PT Plan Current plan remains appropriate    Co-evaluation              AM-PAC PT "6 Clicks" Mobility   Outcome Measure  Help needed turning from your back to your side while in a flat bed without using bedrails?: A Little Help needed moving from lying on your back to sitting on the side of a flat bed without using bedrails?: A Little   Help needed standing up from a chair using your arms (e.g., wheelchair or bedside chair)?: None Help needed to walk in hospital room?: A Little Help needed climbing 3-5 steps with a railing? : A Lot 6 Click Score: 15    End of Session Equipment Utilized During Treatment: Gait belt Activity Tolerance: Patient tolerated treatment well Patient left: in chair;with call bell/phone within reach;with chair alarm set Nurse Communication: Mobility status(Pt able to maintain O2 sat >88 with functional activity today without O2) PT Visit Diagnosis: Unsteadiness on feet (R26.81);Other abnormalities of gait and mobility (R26.89);Difficulty in walking, not elsewhere classified (R26.2);Pain Pain - Right/Left: Right Pain - part of body: Hip     Time: 6812-7517 PT Time Calculation (min) (ACUTE ONLY): 26 min  Charges:  $Gait Training: 8-22 mins $Therapeutic Exercise: 8-22 mins                     Mickey Farber, PT, DPT   Acute Rehabilitation Department [phone]   Otho Bellows 10/29/2019, 12:55 PM

## 2019-10-29 NOTE — Plan of Care (Signed)
  Problem: Education: Goal: Knowledge of General Education information will improve Description: Including pain rating scale, medication(s)/side effects and non-pharmacologic comfort measures Outcome: Progressing   Problem: Clinical Measurements: Goal: Respiratory complications will improve Outcome: Progressing Note: On room air   Problem: Activity: Goal: Risk for activity intolerance will decrease Outcome: Progressing Note: Up to chair tolerating well   Problem: Pain Managment: Goal: General experience of comfort will improve Outcome: Progressing   Problem: Safety: Goal: Ability to remain free from injury will improve Outcome: Progressing   Problem: Skin Integrity: Goal: Risk for impaired skin integrity will decrease Outcome: Progressing   Problem: Nutrition: Goal: Adequate nutrition will be maintained Outcome: Completed/Met   Problem: Coping: Goal: Level of anxiety will decrease Outcome: Completed/Met

## 2019-10-30 LAB — CBC
HCT: 26.2 % — ABNORMAL LOW (ref 39.0–52.0)
Hemoglobin: 8.4 g/dL — ABNORMAL LOW (ref 13.0–17.0)
MCH: 32.6 pg (ref 26.0–34.0)
MCHC: 32.1 g/dL (ref 30.0–36.0)
MCV: 101.6 fL — ABNORMAL HIGH (ref 80.0–100.0)
Platelets: 677 10*3/uL — ABNORMAL HIGH (ref 150–400)
RBC: 2.58 MIL/uL — ABNORMAL LOW (ref 4.22–5.81)
RDW: 12.9 % (ref 11.5–15.5)
WBC: 12 10*3/uL — ABNORMAL HIGH (ref 4.0–10.5)
nRBC: 0 % (ref 0.0–0.2)

## 2019-10-30 NOTE — Plan of Care (Signed)
  Problem: Education: Goal: Knowledge of General Education information will improve Description: Including pain rating scale, medication(s)/side effects and non-pharmacologic comfort measures Outcome: Progressing   Problem: Health Behavior/Discharge Planning: Goal: Ability to manage health-related needs will improve Outcome: Progressing   Problem: Activity: Goal: Risk for activity intolerance will decrease Outcome: Progressing   Problem: Elimination: Goal: Will not experience complications related to bowel motility Outcome: Progressing   Problem: Safety: Goal: Ability to remain free from injury will improve Outcome: Progressing   Problem: Skin Integrity: Goal: Risk for impaired skin integrity will decrease Outcome: Progressing

## 2019-10-30 NOTE — TOC Transition Note (Addendum)
Transition of Care [facility]) - CM/SW Discharge Note   Patient Details  Name: Anthony Escobar MRN: [id number] Date of Birth: [birth year]  [facility] CM/SW Contact:  Atilano Median, LCSW Phone Number: 10/30/2019, 12:49 PM   Clinical Narrative:    Patient was discharged to SNF. Referral coordinated with Tammy [phone]. Patient and family Anthony Escobar [phone] aware and agreeable to this plan. Number to call report given to unit RN Doroteo Bradford. Per SNF request patient's medication( xtandi) will be taken with him to the facility. No other needs at this tie. Case closed to this CSW.   Number to call report [facility] ID # [id number] [facility] date 11/3 Next review date 11/5 Send clinical info to [place]  Final next level of care: [place] Barriers to Discharge: Barriers Resolved   Patient Goals and CMS Choice     Choice offered to / list presented to : Patient  Discharge Placement                       Discharge Plan and Services In-house Referral: Clinical Social Work                                   Social Determinants of Health ([place]) Interventions     Readmission Risk Interventions No flowsheet data found.

## 2019-10-30 NOTE — Progress Notes (Signed)
Report given to Theador Hawthorne, RN @ [place].  Gillermina Phy, patient's home medication went with patient.  No other questions.

## 2019-10-31 ENCOUNTER — Ambulatory Visit: Payer: Medicare Other | Admitting: Radiation Oncology

## 2019-10-31 ENCOUNTER — Inpatient Hospital Stay
Admit: 2019-10-31 | Discharge: 2019-10-31 | Disposition: A | Discharge status: Home / Self Care | Payer: Medicare Other | Attending: Radiation Oncology | Admitting: Radiation Oncology

## 2019-11-02 ENCOUNTER — Ambulatory Visit: Admit: 2019-11-02 | Payer: Medicare Other

## 2019-11-02 ENCOUNTER — Ambulatory Visit
Admit: 2019-11-02 | Discharge: 2019-11-02 | Disposition: A | Discharge status: Home / Self Care | Payer: Medicare Other | Attending: Radiation Oncology | Admitting: Radiation Oncology

## 2019-11-02 ENCOUNTER — Ambulatory Visit: Payer: Medicare Other | Admitting: Radiation Oncology

## 2019-11-02 ENCOUNTER — Telehealth: Payer: Self-pay | Admitting: Radiation Oncology

## 2019-11-02 NOTE — Telephone Encounter (Signed)
Patient not arrived for 1330 appointment. Phoned patient to inquire. Patient states, "I am waiting for my ride to pick me up." Phoned [facility] [place] and spoke with [place]. She reports the patient isn't on the transportation list for today with any appointments. [place] understands this RN will speak with rad onc providers and phone her back.

## 2019-11-05 ENCOUNTER — Telehealth: Payer: Self-pay | Admitting: Radiation Oncology

## 2019-11-05 ENCOUNTER — Telehealth: Payer: Self-pay | Admitting: Orthopaedic Surgery

## 2019-11-05 NOTE — Telephone Encounter (Signed)
Ok for orders? 

## 2019-11-05 NOTE — Telephone Encounter (Signed)
Phoned [facility] to set up transportation for patient's consult and simulation with Dr. Tammi Klippel tomorrow. Discovered patient was discharged on Saturday. Phoned patient's cell. Spoke with patient and confirm tomorrow's appointment. Patient endorses plan to be present for appointments and confirmed he has transportation.

## 2019-11-05 NOTE — Telephone Encounter (Signed)
Cindee Salt with [facility] called in requesting verbal orders for home health pt for 2 times a week for 4 weeks.    [phone]

## 2019-11-06 ENCOUNTER — Encounter: Payer: Self-pay | Admitting: Radiation Oncology

## 2019-11-06 ENCOUNTER — Telehealth: Payer: Self-pay | Admitting: Orthopaedic Surgery

## 2019-11-06 ENCOUNTER — Encounter: Payer: Self-pay | Admitting: Medical Oncology

## 2019-11-06 ENCOUNTER — Ambulatory Visit
Admission: RE | Admit: 2019-11-06 | Discharge: 2019-11-06 | Disposition: A | Discharge status: Home / Self Care | Payer: Medicare Other | Source: Ambulatory Visit | Attending: Radiation Oncology | Admitting: Radiation Oncology

## 2019-11-06 ENCOUNTER — Other Ambulatory Visit: Payer: Self-pay

## 2019-11-06 ENCOUNTER — Encounter: Payer: Self-pay | Admitting: Orthopaedic Surgery

## 2019-11-06 ENCOUNTER — Ambulatory Visit (INDEPENDENT_AMBULATORY_CARE_PROVIDER_SITE_OTHER): Payer: Medicare Other | Admitting: Orthopaedic Surgery

## 2019-11-06 ENCOUNTER — Ambulatory Visit (INDEPENDENT_AMBULATORY_CARE_PROVIDER_SITE_OTHER): Payer: Medicare Other

## 2019-11-06 VITALS — Ht 68.0 in | Wt 157.0 lb

## 2019-11-06 VITALS — BP 123/76 | HR 105 | Temp 98.5°F | Resp 18 | Ht 68.0 in | Wt 151.0 lb

## 2019-11-06 DIAGNOSIS — C7951 Secondary malignant neoplasm of bone: Secondary | ICD-10-CM | POA: Insufficient documentation

## 2019-11-06 DIAGNOSIS — M84451A Pathological fracture, right femur, initial encounter for fracture: Secondary | ICD-10-CM | POA: Diagnosis not present

## 2019-11-06 DIAGNOSIS — C61 Malignant neoplasm of prostate: Secondary | ICD-10-CM | POA: Diagnosis present

## 2019-11-06 DIAGNOSIS — Z9221 Personal history of antineoplastic chemotherapy: Secondary | ICD-10-CM | POA: Insufficient documentation

## 2019-11-06 DIAGNOSIS — Z923 Personal history of irradiation: Secondary | ICD-10-CM | POA: Insufficient documentation

## 2019-11-06 DIAGNOSIS — C7952 Secondary malignant neoplasm of bone marrow: Secondary | ICD-10-CM

## 2019-11-06 MED ORDER — ENOXAPARIN SODIUM 40 MG/0.4ML ~~LOC~~ SOLN: 40.0000 mg | Freq: Every day | SUBCUTANEOUS | 13 refills | Status: DC

## 2019-11-06 MED ORDER — OXYCODONE HCL 5 MG PO TABS: 5.0000 mg | ORAL_TABLET | Freq: Two times a day (BID) | ORAL | 0 refills | Status: DC | PRN

## 2019-11-06 NOTE — Telephone Encounter (Signed)
Per lindsey give 14 syringes.

## 2019-11-06 NOTE — Progress Notes (Signed)
   Post-Op Visit Note   Patient: Anthony Escobar           Date of Birth: Jul 02, 1951           MRN: [id number] Visit Date: 11/06/2019 PCP: [name], MD   Assessment & Plan:  Chief Complaint:  Chief Complaint  Patient presents with  . Right Hip - Routine Post Op    Right intertroch IM nailing DOS 10/22/2019   Visit Diagnoses:  1. Pathologic intertrochanteric fracture, right, initial encounter [facility])     Plan: [name] is 2 weeks status post intramedullary fixation of a right pathologic intertrochanteric femur fracture.  His pain is well controlled.  He is now living at home.  He is getting home health PT.  His surgical incisions are healed without any signs of infection.  We remove the sutures today and place Steri-Strips.  I will extend his Lovenox for another 2 weeks.  Pain medicines were prescribed again today.  He does have appointments today for radiation therapy.  I would like to see him back in 4 weeks with two-view x-rays of the right femur.  Follow-Up Instructions: Return in about 4 weeks (around 12/04/2019).   Orders:  Orders Placed This Encounter  Procedures  . XR FEMUR, MIN 2 VIEWS RIGHT   Meds ordered this encounter  Medications  . oxyCODONE (OXY IR/ROXICODONE) 5 MG immediate release tablet    Sig: Take 1-2 tablets (5-10 mg total) by mouth 2 (two) times daily as needed for severe pain.    Dispense:  30 tablet    Refill:  0  . enoxaparin (LOVENOX) 40 MG/0.4ML injection    Sig: Inject 0.4 mLs (40 mg total) into the skin daily.    Dispense:  0.4 mL    Refill:  13    Imaging: Xr Femur, Min 2 Views Right  Result Date: 11/06/2019 Stable fixation of pathologic hip fracture   PMFS History: Patient Active Problem List   Diagnosis Date Noted  . Hyperkalemia 10/19/2019  . Hyponatremia 10/19/2019  . Pathologic intertrochanteric fracture, right, initial encounter (Anthony Escobar) 10/17/2019  . Acute bronchitis 12/15/2018  . Encounter for antineoplastic  chemotherapy 04/27/2018  . Prostate cancer (Anthony Escobar) 01/19/2018  . Multiple pulmonary nodules 10/28/2017  . COPD (chronic obstructive pulmonary disease) (Anthony Escobar) 10/18/2017  . Tobacco use 10/18/2017   Past Medical History:  Diagnosis Date  . Cancer (Anthony Escobar)   . Hyperlipidemia   . Hypertension     Family History  Problem Relation Age of Onset  . Stroke Mother   . Cancer Father        esophageal    Past Surgical History:  Procedure Laterality Date  . HERNIA REPAIR  1988   bilateral inguinal  . INTRAMEDULLARY (IM) NAIL INTERTROCHANTERIC Right 10/22/2019   Procedure: RIGHT INTERTROCHANTERIC INTRAMEDULLARY (IM) NAIL;  Surgeon: Leandrew Koyanagi, MD;  Location: [facility];  Service: Orthopedics;  Laterality: Right;   Social History   Occupational History  . Not on file  Tobacco Use  . Smoking status: Former Smoker    Packs/day: 1.50    Years: 50.00    Pack years: 75.00    Types: Cigarettes    Quit date: 08/20/2011    Years since quitting: 8.2  . Smokeless tobacco: Never Used  Substance and Sexual Activity  . Alcohol use: Yes    Comment: 1beer a day  . Drug use: No  . Sexual activity: Not on file

## 2019-11-06 NOTE — Progress Notes (Signed)
Radiation Oncology         [phone] ________________________________  Initial outpatient Consultation   Name: Anthony Escobar MRN: [id number]  Date: 11/06/2019  DOB: 05-Mar-1951  NF:AOZH, [name], MD  [name], MD   REFERRING PHYSICIAN: [name], MD  DIAGNOSIS: 68 y.o. gentleman with castrate-resistant prostate cancer with osseous metastases leading to pathologic fracture of the right hip s/p ORIF.    ICD-10-CM   1. Secondary malignant neoplasm of bone and bone marrow (HCC)  C79.51    C79.52     HISTORY OF PRESENT ILLNESS: Anthony Escobar is a 68 y.o. male with a diagnosis of metastatic prostate cancer. He has a history of elevated PSA of 5.6 in 2011. He underwent biopsy by Dr. Janice Norrie in 03/2010, which showed a single core of atypia but was lost to follow up thereafter.  He was initially diagnosed with advanced prostate cancer in January 2019 following work-up for shortness of breath which showed hypermetabolic pulmonary nodules, prostate and bony lesions on PET scan performed in 10/2017.  PSA on 10/28/2017 was 19.57. He began reporting hip pain around this time as well.  Given the suspicious findings on PET imaging and significantly elevated PSA,, he was referred for evaluation in urology by Dr. Karsten Ro on 11/25/2019,  digital rectal examination was performed at that time revealing left lobe firmness and a 2.5 mm left apex nodule.  The patient proceeded to transrectal ultrasound with 12 biopsies of the prostate on 12/29/2017.  The prostate volume measured 51 cc.  Out of 12 core biopsies, 8 were positive.  The maximum Gleason score was 4+4. He was started on ADT at that time.  He was referred to Dr. Alen Blew on 01/19/2018, who recommended Taxotere systemic therapy in addition to ADT. He completed 6 cycles of the Taxotere in 04/2018 with excellent response in PSA with a nadir at 0.9 in April 2019.  He was continued on Lupron for ADT with stable disease on restaging  imaging until October 2019 when his PSA increased to 3.2 and then further up to 38.7 in February 2020.  Again, restaging scans remained stable so he was started on Xtandi in March 2020 with an excellent PSA response with PSA nadir to 0.6 in June 2020.  His most recent PSA on 10/16/2019 was increased at 12.8 and he was complaining of increased, progressive right hip pain. This was evaluated with a right hip MRI on 10/16/2019 to evaluate status of metastatic lesions and revealed multi-lobular extensive enhancing lytic lesions throughout the right femoral head, neck, greater and lesser trochanters extending into the proximal femoral shaft with multiple air fluid levels and areas of cortical breakthrough seen along the greater trochanter.  There was diffuse marrow edema within the adjacent femoral head and proximal femoral shaft without definite fracture as well as a small enhancing cystic lesion in the superior acetabulum and moderate bilateral hip osteoarthritis.  He was referred for evaluation with Dr. Erlinda Hong in orthopedics on 10/17/2019 who recommended ORIF for the suspected pathologic intertrochanteric fracture which was scheduled on an outpatient basis for 10/22/2019.  Unfortunately, prior to his scheduled procedure, his pain became severe and poorly controlled with his narcotic pain medications causing him to present to the ED on 10/19/2019 for further evaluation.  X-rays on admission showed an impacted fracture of the right femoral head and therefore the patient was admitted and proceeded to intertrochanteric nailing on 10/22/2019, with Dr. Erlinda Hong. He was discharged on 10/30/2019 to a skilled nursing facility  for rehab at [place] and has since been discharged home on Saturday, 11/03/2019.  He reports that his pain is much improved postoperatively and feels that he is making good progress with physical therapy.  The patient has kindly been referred today for discussion of potential postoperative radiation treatment  options.  PREVIOUS RADIATION THERAPY: No  PAST MEDICAL HISTORY:  Past Medical History:  Diagnosis Date   Cancer ([place])    Hyperlipidemia    Hypertension       PAST SURGICAL HISTORY: Past Surgical History:  Procedure Laterality Date   HERNIA REPAIR  1988   bilateral inguinal   INTRAMEDULLARY (IM) NAIL INTERTROCHANTERIC Right 10/22/2019   Procedure: RIGHT INTERTROCHANTERIC INTRAMEDULLARY (IM) NAIL;  Surgeon: Leandrew Koyanagi, MD;  Location: [facility];  Service: Orthopedics;  Laterality: Right;    FAMILY HISTORY:  Family History  Problem Relation Age of Onset   Stroke Mother    Cancer Father        esophageal    SOCIAL HISTORY:  Social History   Socioeconomic History   Marital status: Single    Spouse name: Not on file   Number of children: Not on file   Years of education: Not on file   Highest education level: Not on file  Occupational History   Not on file  Social Needs   Financial resource strain: Not on file   Food insecurity    Worry: Not on file    Inability: Not on file   Transportation needs    Medical: Not on file    Non-medical: Not on file  Tobacco Use   Smoking status: Former Smoker    Packs/day: 1.50    Years: 50.00    Pack years: 75.00    Types: Cigarettes    Quit date: 08/20/2011    Years since quitting: 8.2   Smokeless tobacco: Never Used  Substance and Sexual Activity   Alcohol use: Yes    Comment: 1beer a day   Drug use: No   Sexual activity: Not on file  Lifestyle   Physical activity    Days per week: Not on file    Minutes per session: Not on file   Stress: Not on file  Relationships   Social connections    Talks on phone: Not on file    Gets together: Not on file    Attends religious service: Not on file    Active member of club or organization: Not on file    Attends meetings of clubs or organizations: Not on file    Relationship status: Not on file   Intimate partner violence    Fear of current or ex  partner: Not on file    Emotionally abused: Not on file    Physically abused: Not on file    Forced sexual activity: Not on file  Other Topics Concern   Not on file  Social History Narrative   Not on file    ALLERGIES: Patient has no known allergies.  MEDICATIONS:  Current Outpatient Medications  Medication Sig Dispense Refill   albuterol (PROAIR HFA) 108 (90 Base) MCG/ACT inhaler Inhale 2 puffs into the lungs every 4 (four) hours as needed for wheezing or shortness of breath. 1 Inhaler 5   amitriptyline (ELAVIL) 75 MG tablet Take 75 mg by mouth at bedtime.     atorvastatin (LIPITOR) 20 MG tablet Take 20 mg by mouth daily.     budesonide-formoterol (SYMBICORT) 160-4.5 MCG/ACT inhaler Inhale 2 puffs into the lungs  2 (two) times daily.     Coenzyme Q10 (COQ10 PO) Take 1 tablet by mouth daily.     enoxaparin (LOVENOX) 40 MG/0.4ML injection Inject 0.4 mLs (40 mg total) into the skin daily. 0.4 mL 13   enoxaparin (LOVENOX) 40 MG/0.4ML injection Inject 0.4 mLs (40 mg total) into the skin daily. 0.4 mL 13   enzalutamide (XTANDI) 40 MG capsule Take 4 capsules (160 mg total) by mouth daily. 120 capsule 2   lisinopril (ZESTRIL) 20 MG tablet Take 20 mg by mouth daily.     oxyCODONE (OXY IR/ROXICODONE) 5 MG immediate release tablet Take 1-2 tablets (5-10 mg total) by mouth 2 (two) times daily as needed for severe pain. 30 tablet 0   oxyCODONE-acetaminophen (PERCOCET) 5-325 MG tablet Take 1-2 tablets by mouth every 8 (eight) hours as needed for severe pain. 30 tablet 0   oxyCODONE-acetaminophen (PERCOCET) 5-325 MG tablet Take 1-2 tablets by mouth every 8 (eight) hours as needed for severe pain. 30 tablet 0   polyethylene glycol (MIRALAX / GLYCOLAX) 17 g packet Take 17 g by mouth daily. 14 each 0   prochlorperazine (COMPAZINE) 10 MG tablet Take 1 tablet (10 mg total) by mouth every 6 (six) hours as needed for nausea or vomiting. 30 tablet 0   Tiotropium Bromide Monohydrate (SPIRIVA  RESPIMAT) 2.5 MCG/ACT AERS Inhale 2 puffs into the lungs daily. 2 Inhaler 0   TURMERIC PO Take 2 tablets by mouth daily.     No current facility-administered medications for this encounter.     REVIEW OF SYSTEMS:  On review of systems, the patient reports that he is doing well overall. He denies any chest pain, shortness of breath, cough, fevers, chills, night sweats, unintended weight changes. He denies any bowel disturbances, and denies abdominal pain, nausea or vomiting. He reports pain in his right hip, rated 3/10 while sitting and 6-7 when he gets up out of bed but this is greatly improved since his procedure. He is currently taking oxycodone and percocet as prescribed which is managing his pain well. He currently uses crutches and a walker to aid in ambulation.  He denies any numbness or tingling in the lower extremities.  He has not noticed any purulent or bloody discharge from his incision.  A complete review of systems is obtained and is otherwise negative.    PHYSICAL EXAM:  Wt Readings from Last 3 Encounters:  11/06/19 157 lb (71.2 kg)  11/06/19 151 lb (68.5 kg)  10/19/19 157 lb (71.2 kg)   Temp Readings from Last 3 Encounters:  11/06/19 98.5 F (36.9 C)  10/30/19 98.2 F (36.8 C) (Oral)  10/16/19 98 F (36.7 C) (Temporal)   BP Readings from Last 3 Encounters:  11/06/19 123/76  10/30/19 123/76  10/16/19 122/80   Pulse Readings from Last 3 Encounters:  11/06/19 (!) 105  10/30/19 93  10/16/19 (!) 106   Pain Assessment Pain Score: 3 (sitting. reports pain increases when getting in and out of bed to 6-7.) Pain Frequency: Constant Pain Loc: Leg(right upper leg)/10  In general this is a well appearing Caucasian male in no acute distress. He's alert and oriented x4 and appropriate throughout the examination. Cardiopulmonary assessment is negative for acute distress and he exhibits normal effort.    KPS = 80  100 - Normal; no complaints; no evidence of disease. 90   -  Able to carry on normal activity; minor signs or symptoms of disease. 80   - Normal activity with effort; some  signs or symptoms of disease. 28   - Cares for self; unable to carry on normal activity or to do active work. 60   - Requires occasional assistance, but is able to care for most of his personal needs. 50   - Requires considerable assistance and frequent medical care. 57   - Disabled; requires special care and assistance. 56   - Severely disabled; hospital admission is indicated although death not imminent. 79   - Very sick; hospital admission necessary; active supportive treatment necessary. 10   - Moribund; fatal processes progressing rapidly. 0     - Dead  Karnofsky DA, Abelmann [place], Craver LS and Burchenal [facility] [phone]) The use of the nitrogen mustards in the palliative treatment of carcinoma: with particular reference to bronchogenic carcinoma Cancer 1 [id number]  LABORATORY DATA:  Lab Results  Component Value Date   WBC 12.0 (H) 10/30/2019   HGB 8.4 (L) 10/30/2019   HCT 26.2 (L) 10/30/2019   MCV 101.6 (H) 10/30/2019   PLT 677 (H) 10/30/2019   Lab Results  Component Value Date   NA 136 10/29/2019   K 4.2 10/29/2019   CL 104 10/29/2019   CO2 23 10/29/2019   Lab Results  Component Value Date   ALT 13 10/21/2019   AST 43 (H) 10/21/2019   ALKPHOS 197 (H) 10/21/2019   BILITOT 0.7 10/21/2019     RADIOGRAPHY: Dg Chest 1 View  Result Date: 10/19/2019 CLINICAL DATA:  Leukocytosis. EXAM: CHEST  1 VIEW COMPARISON:  October 18, 2017. FINDINGS: The heart size and mediastinal contours are within normal limits. Pulmonary nodules noted on prior radiograph are not well visualized currently. No consolidative process is noted. The visualized skeletal structures are unremarkable. IMPRESSION: No active disease. Electronically Signed   By: Marijo Conception M.D.   On: 10/19/2019 15:54   Mr Hip Right W Wo Contrast  Result Date: 10/16/2019 CLINICAL DATA:  Prostate cancer, continued hip pain  EXAM: MRI OF THE RIGHT HIP WITHOUT AND WITH CONTRAST TECHNIQUE: Multiplanar, multisequence MR imaging was performed both before and after administration of intravenous contrast. CONTRAST:  7.72mL GADAVIST GADOBUTROL 1 MMOL/ML IV SOLN COMPARISON:  CT February 28, 2019 FINDINGS: Bones: There is extensive T1 heterogeneous L/T2 bright multilocular cystic lytic lesion seen throughout the right femoral head, neck, greater and lesser trochanters as well as the proximal femur. Multiple fluid fluid levels are seen, likely blood products layering within the cystic lesions. There does appear to be areas of enhancement within the lesions. Cortical breakthrough seen along the posterior greater trochanter and along the lateral surface of the greater trochanter. There is enhancement involving the synovium. There is a small femoroacetabular joint effusion. No definite fracture seen. There is increased marrow signal seen within the proximal femur, likely marrow edema. There does appear to be a small subchondral cystic lesion in the superior right acetabulum which does show enhancement, and could represent a focus of disease. No other osseous lesions are identified. There is moderate bilateral hip osteoarthritis with superior joint space loss and small subchondral cystic changes. The visualized bony pelvis appears normal. The visualized sacroiliac joints and symphysis pubis appear normal. Articular cartilage and labrum Articular cartilage: There is diffuse chondral thinning seen within the bilateral femoroacetabular joints. Labrum: There is no gross labral tear or paralabral abnormality. Joint or bursal effusion Joint effusion: A small joint effusion is seen at the right femoroacetabular joint with diffuse surrounding synovial enhancement. Bursae: No focal periarticular fluid collection. Muscles and tendons Muscles and  tendons: There is increased signal with enhancement seen within the gluteal musculature and at the iliopsoas insertion  site. The there is increased intrasubstance signal seen at the iliopsoas, gluteal, and hamstrings insertion site. Other findings Miscellaneous: There is a heterogeneous appearing prostate gland. Scattered colonic diverticula are noted. The remainder of the deep pelvis is grossly unremarkable. IMPRESSION: 1. Multi lobular extensive enhancing lytic lesions throughout the right femoral head, neck, greater and lesser trochanters extending into the proximal femoral shaft, consistent with the patient's known metastatic disease. There are multiple air fluid fluid levels with areas of cortical breakthrough seen along the greater trochanter. 2. Diffuse marrow edema within the adjacent femoral head and proximal femoral shaft. No definite fracture however seen. 3. Small enhancing cystic lesion in the superior acetabulum which likely also represent a focus of metastatic disease 4. Moderate bilateral hip osteoarthritis. Electronically Signed   By: Prudencio Pair M.D.   On: 10/16/2019 19:23   Ct Abdomen Pelvis W Contrast  Result Date: 10/24/2019 CLINICAL DATA:  Acute generalized abdominal pain and fever. Postop hip surgery with diffuse abdominal pain, distention and fever. History of prostate cancer. EXAM: CT ABDOMEN AND PELVIS WITH CONTRAST TECHNIQUE: Multidetector CT imaging of the abdomen and pelvis was performed using the standard protocol following bolus administration of intravenous contrast. CONTRAST:  166mL OMNIPAQUE IOHEXOL 300 MG/ML  SOLN COMPARISON:  02/28/2019, 10/03/2013 FINDINGS: Lower chest: Small bilateral pleural effusions with posterior bibasilar atelectasis right worse than left. Calcified plaque over the left anterior descending coronary artery as well as calcified plaque over the distal descending thoracic aorta. Hepatobiliary: Liver, gallbladder and biliary tree are normal. Pancreas: Normal. Spleen: Normal. Adrenals/Urinary Tract: Adrenal glands are normal. Kidneys are normal in size without  hydronephrosis or nephrolithiasis. 1.5 cm cyst over the mid pole left kidney. Several other smaller sub cm bilateral cortical hypodensities too small to characterize but likely cysts. Ureters and bladder are normal. Foley catheter is present within a decompressed bladder. Stomach/Bowel: Stomach is mildly distended. There are multiple air and fluid-filled dilated small bowel loops. Terminal ileum is normal. Mild wall thickening of an ileal loop over the right lower quadrant as this may represent a transition point. Findings may be due to a regional enteritis of infectious or inflammatory nature. Cecum is just right of midline in the mid abdomen. Appendix is normal. Air and stool present throughout the colon. Minimal diverticulosis of the sigmoid colon. Vascular/Lymphatic: Moderate calcified plaque over the abdominal aorta. No adenopathy. Reproductive: Region of the prostate is smaller with 2 cm rounded hypodense focus over the left side of the prostate. Other: Small amount of patchy free fluid within the abdomen/pelvis. No free peritoneal air. Small right inguinal hernia containing only peritoneal fat. Musculoskeletal: Evidence of patient's right hip fracture post internal fixation with intramedullary nail and associated 2 screws bridging the fracture site into the femoral head intact. Postoperative changes in the soft tissues of the right hip compatible patient's recent surgery. Subtle air-fluid level within the subcutaneous fat adjacent the lateral aspect of the right gluteus maximus likely postoperative change although developing infection is possible. Mild degenerate change of the hips and spine. Mild compression deformity over the superior endplate of L1 new since the previous exam with possible early depression of the superior endplate of L2. IMPRESSION: 1. Multiple air and fluid-filled dilated small bowel loops with possible transition point over the right lower quadrant over the ileum with there is a  thick-walled ileal loop. Patchy free fluid in the abdomen/pelvis. Findings likely due  to small bowel obstruction with possible regional enteritis involving and ileal loop in the right lower quadrant. Postoperative ileus less likely. 2. Small bilateral pleural effusions with associated bibasilar atelectasis right worse than left. Infection in the right base is possible. 3. Fixation of right hip fracture with hardware intact. Postoperative changes over the soft tissues of the right hip compatible with patient's recent surgery. Subtle air-fluid level over the soft tissues of the surgical bed likely postoperative although developing infection is possible. 4. Compression deformity involving the superior endplate of L1 new since the previous exam with possible early depression of the superior endplate of L2. 5. Bilateral renal cysts. 6. Prostate gland smaller than seen previously with indeterminate 2 cm hypodense focus along the left side of the prostate in this patient with known prostate cancer. 7.  Diverticulosis of the colon. 8.  Aortic Atherosclerosis (ICD10-I70.0). 9.  Right inguinal hernia containing only peritoneal fat. Electronically Signed   By: Marin Olp M.D.   On: 10/24/2019 10:26   Dg Abd Portable 1v-small Bowel Obstruction Protocol-initial, 8 Hr Delay  Result Date: 10/25/2019 CLINICAL DATA:  Follow up small bowel obstruction EXAM: PORTABLE ABDOMEN - 1 VIEW COMPARISON:  10/24/2019 FINDINGS: Gastric catheter is noted coiled within the stomach. Multiple gas distended loops large and small bowel are noted consistent with small bowel obstruction. The overall appearance is stable from the prior exam. IMPRESSION: Stable small bowel obstruction. Colonic gas is seen indicating a partial obstruction. Electronically Signed   By: Inez Catalina M.D.   On: 10/25/2019 03:43   Dg Abd Portable 1v  Result Date: 10/24/2019 CLINICAL DATA:  Nasogastric tube placement EXAM: PORTABLE ABDOMEN - 1 VIEW COMPARISON:   10/24/2019 CT FINDINGS: Nasogastric tube courses below the diaphragm with distal tip coiled within the region of the gastric body/fundus. Multiple dilated air-filled loops of small bowel are again noted throughout the abdomen, similar to recent CT. IMPRESSION: 1. Satisfactory positioning of NG tube. 2. Findings of small-bowel obstruction. Electronically Signed   By: Davina Poke M.D.   On: 10/24/2019 16:39   Dg Abd Portable 1v  Result Date: 10/21/2019 CLINICAL DATA:  Abdominal pain and distension EXAM: PORTABLE ABDOMEN - 1 VIEW COMPARISON:  None. FINDINGS: There is moderate stool in the colon. There is no appreciable bowel dilatation or air-fluid level to suggest bowel obstruction. No free air. Air is noted within the rectum. There are apparent phleboliths in the pelvis. Visualized lung bases are clear. IMPRESSION: Moderate stool in colon. There is air throughout much of the bowel without bowel dilatation. Bowel obstruction not felt to be likely. No free air. Electronically Signed   By: Lowella Grip III M.D.   On: 10/21/2019 10:28   Dg C-arm 1-60 Min  Result Date: 10/22/2019 CLINICAL DATA:  ORIF of right femur fracture. EXAM: RIGHT FEMUR 2 VIEWS; DG C-ARM 1-60 MIN COMPARISON:  Radiographs 10/19/2019. MRI 10/16/2019. FINDINGS: Four spot fluoroscopic images demonstrate ORIF of the pathologic fracture involving the intertrochanteric region of the right femur using a dynamic compression screw and intramedullary rod. The hardware is well positioned. There is near anatomic reduction of the fracture. No demonstrated complications. IMPRESSION: Near anatomic reduction of pathologic fracture of the proximal right femur status post ORIF. Electronically Signed   By: Richardean Sale M.D.   On: 10/22/2019 17:30   Dg Hip Unilat With Pelvis 1v Right  Result Date: 10/19/2019 CLINICAL DATA:  Right hip pain EXAM: DG HIP (WITH OR WITHOUT PELVIS) 1V RIGHT COMPARISON:  MRI 10/16/2019, CT  02/28/2019 FINDINGS: Mixed  lytic and sclerotic lesion involving the right femoral head, neck, trochanter and proximal shaft. Acute slightly impacted appearing fracture involving the base of the femoral neck. No femoral head dislocation. Left hip demonstrates normal alignment. IMPRESSION: Mixed lytic and sclerotic lesion involving the proximal right femur, with evidence of acute mildly impacted fracture involving the base of the femoral neck, consistent with pathologic fracture. Electronically Signed   By: Donavan Foil M.D.   On: 10/19/2019 15:22   Dg Femur, Min 2 Views Right  Result Date: 10/22/2019 CLINICAL DATA:  ORIF of right femur fracture. EXAM: RIGHT FEMUR 2 VIEWS; DG C-ARM 1-60 MIN COMPARISON:  Radiographs 10/19/2019. MRI 10/16/2019. FINDINGS: Four spot fluoroscopic images demonstrate ORIF of the pathologic fracture involving the intertrochanteric region of the right femur using a dynamic compression screw and intramedullary rod. The hardware is well positioned. There is near anatomic reduction of the fracture. No demonstrated complications. IMPRESSION: Near anatomic reduction of pathologic fracture of the proximal right femur status post ORIF. Electronically Signed   By: Richardean Sale M.D.   On: 10/22/2019 17:30   Xr Femur, Min 2 Views Right  Result Date: 11/06/2019 Stable fixation of pathologic hip fracture     IMPRESSION/PLAN: 1. 68 y.o. gentleman with castrate-resistant prostate cancer with progressive osseous metastases leading to recent pathologic fracture of the right hip. Today, we talked to the patient about the findings and workup thus far. We discussed the natural history of metastatic prostate cancer and general treatment, highlighting the role of palliative radiotherapy in the management. We discussed the available radiation techniques, and focused on the details of logistics and delivery.  The recommendation is to proceed with a 2-week course of daily palliative radiotherapy to the right hip.  We  reviewed the anticipated acute and late sequelae associated with radiation in this setting. The patient was encouraged to ask questions that were answered to his stated satisfaction.  At the end of the conversation the patient is interested in moving forward with 2 weeks of palliative radiation therapy directed at the pathologic fracture of right intertrochanteric region. He is scheduled to undergo CT simulation following our appointment today in anticipation of beginning his treatments on 11/12/2019.  He has freely signed written consent to proceed today in the office and a copy of this document will be placed in his chart.  He appears to have a good understanding of his disease and our treatment recommendations which are of palliative intent.  He is comfortable and in agreement with the stated plan.  We will share our discussion with Dr. Alen Blew and Dr. Erlinda Hong and proceed with treatment accordingly.    [name], PA-C    Tyler Pita, MD  [facility] Direct Dial: [phone]   Fax: [phone] .com   Skype   LinkedIn   This document serves as a record of services personally performed by Tyler Pita, MD and Freeman Caldron, PA-C. It was created on their behalf by Wilburn Mylar, a trained medical scribe. The creation of this record is based on the scribe's personal observations and the provider's statements to them. This document has been checked and approved by the attending provider.

## 2019-11-06 NOTE — Telephone Encounter (Signed)
York Cerise with [facility] called in regarding a prescription that was sent over for Enoxaparin, they need some more information before filling prescription.   [phone]

## 2019-11-06 NOTE — Progress Notes (Signed)
Histology and Location of Primary Cancer: Prostate cancer diagnosed in 2019. Gleason score 4+4 = 8 and PSA of 19.57 at the time of diagnosis  Sites of Visceral and Bony Metastatic Disease: bony  Location(s) of Symptomatic Metastases: impending pathologic fracture of right intertrochanteric region  Past/Anticipated chemotherapy by medical oncology, if any:  Taxotere chemotherapy with the first cycle of chemotherapy given on February 02, 2018. He completed 6 cycles of therapy on May 18, 2018.   Current therapy:  Androgen deprivation therapy with Lupron.He received Lupron 30 mg in June 14, 2019.  Xtandi 160 mg daily started in March of 2020.  He has tolerated Xtandi with excellent PSA response at this time. His PSA on June 18 was down to 0.6.   Pain on a scale of 0-10 is: Reports pain in right upper leg 3 on a scale of 0-10 today while sitting. Reports pain becomes a 6-7 when he gets up out of the bed. Reports taking oxycodone and percocet as prescribed around the clock.  If Spine Met(s), symptoms, if any, include:  Bowel/Bladder retention or incontinence (please describe): Denies LUTS other than nocturia. Denies any bowel complaints.   Numbness or weakness in extremities (please describe): Denies.  Current Decadron regimen, if applicable: no  Ambulatory status? Walker? Wheelchair?: Ambulating gingerly with the aid of crutches and walker.  SAFETY ISSUES:  Prior radiation? denies  Pacemaker/ICD? denies  Possible current pregnancy? no, male patient  Is the patient on methotrexate? denies  Current Complaints / other details:  68 year old male. Single. One grown son who is an NP. Granddaughter lives in adjoining apartment. 

## 2019-11-06 NOTE — Telephone Encounter (Signed)
I left voicemail for Anthony Escobar advising.

## 2019-11-06 NOTE — Telephone Encounter (Signed)
Ok to do

## 2019-11-06 NOTE — Progress Notes (Signed)
  Radiation Oncology         [phone] ________________________________  Name: Anthony Escobar MRN: [id number]  Date: 11/06/2019  DOB: 06-Jul-1951  SIMULATION AND TREATMENT PLANNING NOTE    ICD-10-CM   1. Prostate cancer [facility])  C61     DIAGNOSIS:  68 yo man s/p ORIF for right hip metastasis  NARRATIVE:  The patient was brought to the [facility].  Identity was confirmed.  All relevant records and images related to the planned course of therapy were reviewed.  The patient freely provided informed written consent to proceed with treatment after reviewing the details related to the planned course of therapy. The consent form was witnessed and verified by the simulation staff.  Then, the patient was set-up in a stable reproducible  supine position for radiation therapy.  CT images were obtained.  Surface markings were placed.  The CT images were loaded into the planning software.  Then the target and avoidance structures were contoured.  Treatment planning then occurred.  The radiation prescription was entered and confirmed.  Then, I designed and supervised the construction of a total of 3 medically necessary complex treatment devices consisting of leg positioner and MLC apertures to cover the treated hip area.  I have requested : 3D Simulation  I have requested a DVH of the following structures: Rectum, Bladder, femoral heads and target.  PLAN:  The patient will receive 30 Gy in 10 fractions.  ________________________________  Sheral Apley Tammi Klippel, M.D.

## 2019-11-06 NOTE — Progress Notes (Signed)
See progress note under physician encounter. 

## 2019-11-08 ENCOUNTER — Other Ambulatory Visit: Payer: Self-pay

## 2019-11-08 ENCOUNTER — Telehealth: Payer: Self-pay | Admitting: Orthopaedic Surgery

## 2019-11-08 MED ORDER — RIVAROXABAN 10 MG PO TABS: 10.0000 mg | ORAL_TABLET | Freq: Every day | ORAL | 0 refills | Status: DC

## 2019-11-08 NOTE — Telephone Encounter (Signed)
Xarelto 10 mg daily until he comes back to see me

## 2019-11-08 NOTE — Telephone Encounter (Signed)
Anthony Escobar with [facility] left a voicemail message stating that Dr. Erlinda Hong wanted him to stay on the Lovenox, but his insurance will not pay for it again until the 30th of November.  The patient did not get the medication due to the cost.  He wanted to know what Dr Erlinda Hong would like for the patient to do.  You can call Louie Casa back at [phone] or call the patient.  Thank you.

## 2019-11-08 NOTE — Telephone Encounter (Signed)
Called Anthony Escobar to advise. Also called patient to advise.

## 2019-11-08 NOTE — Telephone Encounter (Signed)
Please advise 

## 2019-11-09 DIAGNOSIS — C61 Malignant neoplasm of prostate: Secondary | ICD-10-CM | POA: Insufficient documentation

## 2019-11-12 DIAGNOSIS — C7951 Secondary malignant neoplasm of bone: Secondary | ICD-10-CM | POA: Diagnosis not present

## 2019-11-13 ENCOUNTER — Telehealth: Payer: Self-pay | Admitting: Pharmacist

## 2019-11-13 ENCOUNTER — Ambulatory Visit
Admission: RE | Admit: 2019-11-13 | Discharge: 2019-11-13 | Disposition: A | Discharge status: Home / Self Care | Payer: Medicare Other | Source: Ambulatory Visit | Attending: Radiation Oncology | Admitting: Radiation Oncology

## 2019-11-13 ENCOUNTER — Other Ambulatory Visit: Payer: Self-pay

## 2019-11-13 DIAGNOSIS — C7951 Secondary malignant neoplasm of bone: Secondary | ICD-10-CM | POA: Diagnosis not present

## 2019-11-13 NOTE — Telephone Encounter (Signed)
Oral Chemotherapy Pharmacist Encounter   Spoke with patient's granddaughter, [name], today to follow up regarding patient's oral chemotherapy medication: Xtandi (enzalutamide) for the treatment of metastatic, castration resistant prostate cancer in conjunction with androgen deprivation therapy (Lupron injections), planned duration until disease progression or unacceptable toxicity.  Original Start date of oral chemotherapy: 03/01/2019  [name] reports that patient has missed 0 tablets/doses of Xtandi 40mg  capsules, 4 capsules (160mg ) by mouth once daily without regard to food, in the last month.  Pt reports the following side effects: mild fatigue, manageable and unchanged  Pertinent labs reviewed: OK for continued treatment.  Other Issues:   [name] reminded that patient has been successfully enrolled to receive a foundation copayment grant with [employer] that will cover the out of pocket expenses for Xtandi at the [facility] starting in Jan 2021.  The 1st fill from [facility] will ship on 01/01/2019 for delivery on 1/7.  Patient will continue to receive his Gillermina Phy through the [employer] assistance program until the end of the 2020 calendar year.  [name] knows to call the office with questions or concerns.  Johny Drilling, PharmD, BCPS, BCOP  11/13/2019 3:24 PM Oral Oncology Clinic [phone]

## 2019-11-14 ENCOUNTER — Other Ambulatory Visit: Payer: Self-pay

## 2019-11-14 ENCOUNTER — Ambulatory Visit
Admission: RE | Admit: 2019-11-14 | Discharge: 2019-11-14 | Disposition: A | Discharge status: Home / Self Care | Payer: Medicare Other | Source: Ambulatory Visit | Attending: Radiation Oncology | Admitting: Radiation Oncology

## 2019-11-14 DIAGNOSIS — C7951 Secondary malignant neoplasm of bone: Secondary | ICD-10-CM | POA: Diagnosis not present

## 2019-11-15 ENCOUNTER — Other Ambulatory Visit: Payer: Self-pay

## 2019-11-15 ENCOUNTER — Ambulatory Visit: Payer: Medicare Other

## 2019-11-15 ENCOUNTER — Ambulatory Visit
Admission: RE | Admit: 2019-11-15 | Discharge: 2019-11-15 | Disposition: A | Discharge status: Home / Self Care | Payer: Medicare Other | Source: Ambulatory Visit | Attending: Radiation Oncology | Admitting: Radiation Oncology

## 2019-11-15 DIAGNOSIS — C7951 Secondary malignant neoplasm of bone: Secondary | ICD-10-CM | POA: Diagnosis not present

## 2019-11-16 ENCOUNTER — Ambulatory Visit
Admission: RE | Admit: 2019-11-16 | Discharge: 2019-11-16 | Disposition: A | Discharge status: Home / Self Care | Payer: Medicare Other | Source: Ambulatory Visit | Attending: Radiation Oncology | Admitting: Radiation Oncology

## 2019-11-16 ENCOUNTER — Other Ambulatory Visit: Payer: Self-pay

## 2019-11-16 DIAGNOSIS — C7951 Secondary malignant neoplasm of bone: Secondary | ICD-10-CM | POA: Diagnosis not present

## 2019-11-18 ENCOUNTER — Other Ambulatory Visit: Payer: Self-pay

## 2019-11-18 ENCOUNTER — Ambulatory Visit
Admission: RE | Admit: 2019-11-18 | Discharge: 2019-11-18 | Disposition: A | Discharge status: Home / Self Care | Payer: Medicare Other | Source: Ambulatory Visit | Attending: Radiation Oncology | Admitting: Radiation Oncology

## 2019-11-18 DIAGNOSIS — C7951 Secondary malignant neoplasm of bone: Secondary | ICD-10-CM | POA: Diagnosis not present

## 2019-11-19 ENCOUNTER — Other Ambulatory Visit: Payer: Self-pay

## 2019-11-19 ENCOUNTER — Ambulatory Visit
Admission: RE | Admit: 2019-11-19 | Discharge: 2019-11-19 | Disposition: A | Discharge status: Home / Self Care | Payer: Medicare Other | Source: Ambulatory Visit | Attending: Radiation Oncology | Admitting: Radiation Oncology

## 2019-11-19 DIAGNOSIS — C7951 Secondary malignant neoplasm of bone: Secondary | ICD-10-CM | POA: Diagnosis not present

## 2019-11-20 ENCOUNTER — Ambulatory Visit
Admission: RE | Admit: 2019-11-20 | Discharge: 2019-11-20 | Disposition: A | Discharge status: Home / Self Care | Payer: Medicare Other | Source: Ambulatory Visit | Attending: Radiation Oncology | Admitting: Radiation Oncology

## 2019-11-20 DIAGNOSIS — C7951 Secondary malignant neoplasm of bone: Secondary | ICD-10-CM | POA: Diagnosis not present

## 2019-11-21 ENCOUNTER — Ambulatory Visit
Admission: RE | Admit: 2019-11-21 | Discharge: 2019-11-21 | Disposition: A | Discharge status: Home / Self Care | Payer: Medicare Other | Source: Ambulatory Visit | Attending: Radiation Oncology | Admitting: Radiation Oncology

## 2019-11-21 ENCOUNTER — Other Ambulatory Visit: Payer: Self-pay

## 2019-11-21 DIAGNOSIS — C7951 Secondary malignant neoplasm of bone: Secondary | ICD-10-CM | POA: Diagnosis not present

## 2019-11-26 ENCOUNTER — Other Ambulatory Visit: Payer: Self-pay | Admitting: Radiation Oncology

## 2019-11-26 ENCOUNTER — Other Ambulatory Visit: Payer: Self-pay

## 2019-11-26 ENCOUNTER — Ambulatory Visit
Admission: RE | Admit: 2019-11-26 | Discharge: 2019-11-26 | Disposition: A | Discharge status: Home / Self Care | Payer: Medicare Other | Source: Ambulatory Visit | Attending: Radiation Oncology | Admitting: Radiation Oncology

## 2019-11-26 DIAGNOSIS — C7951 Secondary malignant neoplasm of bone: Secondary | ICD-10-CM | POA: Diagnosis not present

## 2019-11-26 MED ORDER — OXYCODONE HCL 5 MG PO TABS: 5.0000 mg | ORAL_TABLET | Freq: Two times a day (BID) | ORAL | 0 refills | Status: DC | PRN

## 2019-11-27 ENCOUNTER — Encounter: Payer: Self-pay | Admitting: Radiation Oncology

## 2019-11-27 ENCOUNTER — Ambulatory Visit: Payer: Medicare Other | Attending: Radiation Oncology

## 2019-11-27 ENCOUNTER — Ambulatory Visit: Payer: Medicare Other

## 2019-11-27 ENCOUNTER — Other Ambulatory Visit: Payer: Self-pay

## 2019-11-27 DIAGNOSIS — C61 Malignant neoplasm of prostate: Secondary | ICD-10-CM | POA: Insufficient documentation

## 2019-11-27 DIAGNOSIS — C7951 Secondary malignant neoplasm of bone: Secondary | ICD-10-CM | POA: Insufficient documentation

## 2019-11-28 ENCOUNTER — Ambulatory Visit: Payer: Medicare Other

## 2019-12-04 ENCOUNTER — Ambulatory Visit (INDEPENDENT_AMBULATORY_CARE_PROVIDER_SITE_OTHER): Payer: Medicare Other | Admitting: Orthopaedic Surgery

## 2019-12-04 ENCOUNTER — Ambulatory Visit (INDEPENDENT_AMBULATORY_CARE_PROVIDER_SITE_OTHER): Payer: Medicare Other

## 2019-12-04 ENCOUNTER — Other Ambulatory Visit: Payer: Self-pay

## 2019-12-04 ENCOUNTER — Encounter: Payer: Self-pay | Admitting: Orthopaedic Surgery

## 2019-12-04 DIAGNOSIS — M84451A Pathological fracture, right femur, initial encounter for fracture: Secondary | ICD-10-CM | POA: Diagnosis not present

## 2019-12-04 NOTE — Progress Notes (Signed)
Post-Op Visit Note   Patient: Anthony Escobar           Date of Birth: [birth year]           MRN: [id number] Visit Date: 12/04/2019 PCP: [name], MD   Assessment & Plan:  Chief Complaint:  Chief Complaint  Patient presents with  . Right Hip - Pain, Follow-up   Visit Diagnoses:  1. Pathologic intertrochanteric fracture, right, initial encounter [facility])     Plan: Shyne is 6 weeks status post IM nail fixation for right pathologic intertrochanteric fracture.  He is doing well overall.  He is doing home exercise.  He has completed radiation.  He no longer uses any assistive devices.  He has been walking around [place] for exercise.  He is very happy with how he has recovered.  Physical exam shows well-healed surgical scars.  His mild discomfort with range of motion of the hip.  He is able to ambulate with a small limp.  He is in no appreciable pain.  He does have a swollen right inguinal lymph node.  It is not tender to palpation.  His x-rays demonstrate continued healing of the fracture without complication of the hardware.  From my standpoint he is healing the fracture well.  Physically speaking he has improved significantly.  I have asked him to see his oncologist about his right inguinal lymphadenopathy.  I would like to recheck him in 6 weeks with two-view x-rays of the right hip.  Follow-Up Instructions: Return in about 6 weeks (around 01/15/2020).   Orders:  Orders Placed This Encounter  Procedures  . XR FEMUR, MIN 2 VIEWS RIGHT   No orders of the defined types were placed in this encounter.   Imaging: Xr Femur, Min 2 Views Right  Result Date: 12/04/2019 Stable fixation of pathologic fracture.  No complications.  No lucency of the fracture.   PMFS History: Patient Active Problem List   Diagnosis Date Noted  . Prostate cancer metastatic to bone ([place]) 11/09/2019  . Hyperkalemia 10/19/2019  . Hyponatremia 10/19/2019  . Pathologic intertrochanteric  fracture, right, initial encounter ([place]) 10/17/2019  . Acute bronchitis 12/15/2018  . Encounter for antineoplastic chemotherapy 04/27/2018  . Prostate cancer ([place]) 01/19/2018  . Multiple pulmonary nodules 10/28/2017  . COPD (chronic obstructive pulmonary disease) ([place]) 10/18/2017  . Tobacco use 10/18/2017   Past Medical History:  Diagnosis Date  . Cancer ([place])   . Hyperlipidemia   . Hypertension     Family History  Problem Relation Age of Onset  . Stroke Mother   . Cancer Father        esophageal  . Cancer Paternal Grandmother 34       unknown  . Prostate cancer Neg Hx   . Breast cancer Neg Hx   . Colon cancer Neg Hx     Past Surgical History:  Procedure Laterality Date  . HERNIA REPAIR  1988   bilateral inguinal  . INTRAMEDULLARY (IM) NAIL INTERTROCHANTERIC Right 10/22/2019   Procedure: RIGHT INTERTROCHANTERIC INTRAMEDULLARY (IM) NAIL;  Surgeon: Leandrew Koyanagi, MD;  Location: [place];  Service: Orthopedics;  Laterality: Right;   Social History   Occupational History  . Not on file  Tobacco Use  . Smoking status: Former Smoker    Packs/day: 1.50    Years: 50.00    Pack years: 75.00    Types: Cigarettes    Quit date: 08/20/2011    Years since quitting: 8.2  . Smokeless tobacco: Never Used  Substance and Sexual Activity  . Alcohol use: Yes    Comment: 1beer a day  . Drug use: No  . Sexual activity: Not Currently

## 2019-12-16 NOTE — Progress Notes (Signed)
  Radiation Oncology         [phone] ________________________________  Name: Anthony Escobar MRN: [id number]  Date: 11/27/2019  DOB: 1951/12/16  End of Treatment Note  Diagnosis:   69 yo man s/p ORIF for right hip metastasis     Indication for treatment:  Palliation, Hardware Stabilization       Radiation treatment dates:   11/17-12/1/20  Site/dose:   The right hip was treated to 30 Gy in 10 fractions of 3 Gy.  Beams/energy:   Anterior and posterior fields were treated to using 3D techniques  Narrative: The patient tolerated radiation treatment relatively well.     Plan: The patient has completed radiation treatment. The patient will return to radiation oncology clinic for routine followup in one month. I advised him to call or return sooner if he has any questions or concerns related to his recovery or treatment. ________________________________  Sheral Apley. Tammi Klippel, M.D.

## 2019-12-18 ENCOUNTER — Inpatient Hospital Stay: Payer: Medicare Other

## 2019-12-18 ENCOUNTER — Other Ambulatory Visit: Payer: Self-pay

## 2019-12-18 ENCOUNTER — Telehealth: Payer: Self-pay | Admitting: Oncology

## 2019-12-18 ENCOUNTER — Inpatient Hospital Stay (HOSPITAL_BASED_OUTPATIENT_CLINIC_OR_DEPARTMENT_OTHER): Payer: Medicare Other | Admitting: Oncology

## 2019-12-18 ENCOUNTER — Inpatient Hospital Stay: Payer: Medicare Other | Attending: Oncology

## 2019-12-18 VITALS — BP 132/81 | HR 102 | Temp 97.8°F | Resp 18 | Ht 68.0 in | Wt 147.6 lb

## 2019-12-18 DIAGNOSIS — Z79899 Other long term (current) drug therapy: Secondary | ICD-10-CM | POA: Diagnosis not present

## 2019-12-18 DIAGNOSIS — C61 Malignant neoplasm of prostate: Secondary | ICD-10-CM | POA: Diagnosis present

## 2019-12-18 DIAGNOSIS — Z5111 Encounter for antineoplastic chemotherapy: Secondary | ICD-10-CM | POA: Insufficient documentation

## 2019-12-18 DIAGNOSIS — Z7901 Long term (current) use of anticoagulants: Secondary | ICD-10-CM | POA: Diagnosis not present

## 2019-12-18 DIAGNOSIS — M84459A Pathological fracture, hip, unspecified, initial encounter for fracture: Secondary | ICD-10-CM | POA: Diagnosis not present

## 2019-12-18 DIAGNOSIS — R634 Abnormal weight loss: Secondary | ICD-10-CM | POA: Diagnosis not present

## 2019-12-18 LAB — CBC WITH DIFFERENTIAL (CANCER CENTER ONLY)
Abs Immature Granulocytes: 0.02 10*3/uL (ref 0.00–0.07)
Basophils Absolute: 0 10*3/uL (ref 0.0–0.1)
Basophils Relative: 0 %
Eosinophils Absolute: 0.1 10*3/uL (ref 0.0–0.5)
Eosinophils Relative: 1 %
HCT: 39.1 % (ref 39.0–52.0)
Hemoglobin: 12.6 g/dL — ABNORMAL LOW (ref 13.0–17.0)
Immature Granulocytes: 0 %
Lymphocytes Relative: 12 %
Lymphs Abs: 0.8 10*3/uL (ref 0.7–4.0)
MCH: 31.9 pg (ref 26.0–34.0)
MCHC: 32.2 g/dL (ref 30.0–36.0)
MCV: 99 fL (ref 80.0–100.0)
Monocytes Absolute: 0.4 10*3/uL (ref 0.1–1.0)
Monocytes Relative: 6 %
Neutro Abs: 5.2 10*3/uL (ref 1.7–7.7)
Neutrophils Relative %: 81 %
Platelet Count: 401 10*3/uL — ABNORMAL HIGH (ref 150–400)
RBC: 3.95 MIL/uL — ABNORMAL LOW (ref 4.22–5.81)
RDW: 13.4 % (ref 11.5–15.5)
WBC Count: 6.5 10*3/uL (ref 4.0–10.5)
nRBC: 0 % (ref 0.0–0.2)

## 2019-12-18 LAB — CMP (CANCER CENTER ONLY)
ALT: 6 U/L (ref 0–44)
AST: 14 U/L — ABNORMAL LOW (ref 15–41)
Albumin: 3.8 g/dL (ref 3.5–5.0)
Alkaline Phosphatase: 82 U/L (ref 38–126)
Anion gap: 13 (ref 5–15)
BUN: 16 mg/dL (ref 8–23)
CO2: 25 mmol/L (ref 22–32)
Calcium: 9.8 mg/dL (ref 8.9–10.3)
Chloride: 100 mmol/L (ref 98–111)
Creatinine: 0.93 mg/dL (ref 0.61–1.24)
GFR, Est AFR Am: >60 mL/min (ref >60)
GFR, Estimated: >60 mL/min (ref >60)
Glucose, Bld: 130 mg/dL — ABNORMAL HIGH (ref 70–99)
Potassium: 4 mmol/L (ref 3.5–5.1)
Sodium: 138 mmol/L (ref 135–145)
Total Bilirubin: 0.4 mg/dL (ref 0.3–1.2)
Total Protein: 7.5 g/dL (ref 6.5–8.1)

## 2019-12-18 MED ORDER — LEUPROLIDE ACETATE (4 MONTH) 30 MG ~~LOC~~ KIT
30.0000 mg | PACK | Freq: Once | SUBCUTANEOUS | Status: AC
  Administered 2019-12-18: 30 mg via SUBCUTANEOUS
  Filled 2019-12-18: qty 30

## 2019-12-18 MED ORDER — OXYCODONE HCL 5 MG PO TABS: 5.0000 mg | ORAL_TABLET | Freq: Two times a day (BID) | ORAL | 0 refills | Status: DC | PRN

## 2019-12-18 NOTE — Telephone Encounter (Signed)
Scheduled appt per 12/22 los.  Patient voice mail box was not set up.  Sent a message to HIM pool to get a calendar mailed out.

## 2019-12-18 NOTE — Patient Instructions (Signed)

## 2019-12-18 NOTE — Progress Notes (Signed)
Hematology and Oncology Follow Up Visit  VERDUN RACKLEY [id number] Jan 10, 1951 68 y.o. 12/18/2019 8:06 AM [name], MDBoyd, [name], MD   Principle Diagnosis: 68 year old man with advanced prostate cancer and disease to the bone diagnosed in 2019.  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   Prior Therapy:  He is status post prostate biopsy on December 29, 2017 showed a Gleason score 4+4 = 8 and 5 cores.   Taxotere chemotherapy with the first cycle of chemotherapy given on February 02, 2018.  He completed 6 cycles of therapy on May 18, 2018.  He is status post treatment of a intertrochanteric fracture and intramedullary implant completed on October 22, 2019 after sustaining a pathological fracture of the right hip.  He is status post right hip radiation for a total of 30 Gy in 10 fractions completed on November 27, 2019.  Current therapy:  Androgen deprivation therapy with Lupron.  He will receive the Eligard on December 18, 2019 and repeat in 4 months.  Xtandi 160 mg daily started in March of 2020.  Interim History: Mr. Hoadley returns today for a follow-up.  Since the last visit, he underwent fixation of an intertrochanteric hip fracture and intramedullary implant placements after sustaining a pathological fracture of his right hip.  He recovered reasonably well from his surgery and he regained most activities of daily living.  He tolerated the postoperative radiation without any recent falls or syncope.  His pain is much better at this time and ambulates much better as the day progresses.  He does report a right inguinal mass which was noted in the last 2 months.  He continues to tolerate Xtandi without any new complications.  Denies any nausea or abdominal pain.  He denies any appetite changes although he has lost weight.   Patient denied any alteration mental status, neuropathy, confusion or dizziness.   Denies any headaches or lethargy.  Denies any night sweats, weight loss or changes in appetite.  Denied orthopnea, dyspnea on exertion or chest discomfort.  Denies shortness of breath, difficulty breathing hemoptysis or cough.  Denies any abdominal distention, nausea, early satiety or dyspepsia.  Denies any hematuria, frequency, dysuria or nocturia.  Denies any skin irritation, dryness or rash.  Denies any ecchymosis or petechiae.  Denies any lymphadenopathy or clotting.  Denies any heat or cold intolerance.  Denies any anxiety or depression.  Remaining review of system is negative.  Medications: Unchanged on review. Current Outpatient Medications  Medication Sig Dispense Refill  . albuterol (PROAIR HFA) 108 (90 Base) MCG/ACT inhaler Inhale 2 puffs into the lungs every 4 (four) hours as needed for wheezing or shortness of breath. 1 Inhaler 5  . amitriptyline (ELAVIL) 75 MG tablet Take 75 mg by mouth at bedtime.    . cholecalciferol (VITAMIN D3) 25 MCG (1000 UT) tablet Take 1,000 Units by mouth daily.    . Coenzyme Q10 (COQ10 PO) Take 1 tablet by mouth daily.    Marland Kitchen enoxaparin (LOVENOX) 40 MG/0.4ML injection Inject 0.4 mLs (40 mg total) into the skin daily. (Patient not taking: Reported on 11/06/2019) 0.4 mL 13  . enzalutamide (XTANDI) 40 MG capsule Take 4 capsules (160 mg total) by mouth daily. 120 capsule 2  . lisinopril (ZESTRIL) 20 MG tablet Take 20 mg by mouth daily.    . Multiple Vitamin (MULTIVITAMIN) tablet Take 1 tablet by mouth daily.    Marland Kitchen oxyCODONE (OXY IR/ROXICODONE) 5  MG immediate release tablet Take 1-2 tablets (5-10 mg total) by mouth 2 (two) times daily as needed for severe pain. 30 tablet 0  . oxyCODONE-acetaminophen (PERCOCET) 5-325 MG tablet Take 1-2 tablets by mouth every 8 (eight) hours as needed for severe pain. 30 tablet 0  . polyethylene glycol (MIRALAX / GLYCOLAX) 17 g packet Take 17 g by mouth daily. 14 each 0  . prochlorperazine (COMPAZINE) 10 MG tablet Take 1 tablet (10 mg  total) by mouth every 6 (six) hours as needed for nausea or vomiting. (Patient not taking: Reported on 11/06/2019) 30 tablet 0  . rivaroxaban (XARELTO) 10 MG TABS tablet Take 1 tablet (10 mg total) by mouth daily. 30 tablet 0  . rosuvastatin (CRESTOR) 10 MG tablet Take 10 mg by mouth at bedtime.    . Tiotropium Bromide Monohydrate (SPIRIVA RESPIMAT) 2.5 MCG/ACT AERS Inhale 2 puffs into the lungs daily. 2 Inhaler 0  . TURMERIC PO Take 2 tablets by mouth daily.     No current facility-administered medications for this visit.     Allergies: No Known Allergies  Past Medical History, Surgical history, Social history, and Family History without any changes on review. :  Physical Exam:      ECOG: 0    General appearance: Comfortable appearing without any discomfort Head: Normocephalic without any trauma Oropharynx: Mucous membranes are moist and pink without any thrush or ulcers. Eyes: Pupils are equal and round reactive to light. Lymph nodes: No cervical, supraclavicular, inguinal or axillary lymphadenopathy.   Heart:regular rate and rhythm.  S1 and S2 without leg edema. Lung: Clear without any rhonchi or wheezes.  No dullness to percussion. Abdomin: Soft, nontender, nondistended with good bowel sounds.  No hepatosplenomegaly. GU: Small protrusion noted in his right inguinal canal.  No erythema or induration noted. Musculoskeletal: No joint deformity or effusion.  Full range of motion noted. Neurological: No deficits noted on motor, sensory and deep tendon reflex exam. Skin: No petechial rash or dryness.  Appeared moist.             Lab Results: Lab Results  Component Value Date   WBC 12.0 (H) 10/30/2019   HGB 8.4 (L) 10/30/2019   HCT 26.2 (L) 10/30/2019   MCV 101.6 (H) 10/30/2019   PLT 677 (H) 10/30/2019     Chemistry      Component Value Date/Time   NA 136 10/29/2019 0526   K 4.2 10/29/2019 0526   CL 104 10/29/2019 0526   CO2 23 10/29/2019 0526   BUN 9  10/29/2019 0526   CREATININE 0.66 10/29/2019 0526   CREATININE 1.20 10/16/2019 0824   CREATININE 0.88 05/23/2013 1422      Component Value Date/Time   CALCIUM 8.7 (L) 10/29/2019 0526   ALKPHOS 197 (H) 10/21/2019 0625   AST 43 (H) 10/21/2019 0625   AST 21 10/16/2019 0824   ALT 13 10/21/2019 0625   ALT 9 10/16/2019 0824   BILITOT 0.7 10/21/2019 0625   BILITOT 0.2 (L) 10/16/2019 0824      Results for HUAN, POLLOK (MRN [id number]) as of 12/18/2019 08:32  Ref. Range 10/16/2019 08:24  Prostate Specific Ag, Serum Latest Ref Range: 0.0 - 4.0 ng/mL 12.8 (H)       Impression and Plan:  68 year old man with:   1.    Advanced prostate cancer with disease to the bone diagnosed in 2019.  He has castration-resistant disease at this time.     He has been on Xtandi since March 2020  with excellent PSA response up till June 2020 with a PSA nadir of 0.6.  His PSA did increase up to 12.8 in October 2020 around the time of diagnosis of his right hip fracture.  The natural course of his disease as well as alternative treatment options were reviewed.  Returning to systemic chemotherapy is likely needed in the future given his a rise in his PSA.  We will await the PSA results from today and will continue Xtandi for short-term given his recent surgery before potentially transitioning to systemic chemotherapy.  Alternative options including Xofigo as well.    2.    Right hip fracture: He is status post surgical fixation without any major postoperative complications.  He did receive radiation therapy as well.  Prescription for oxycodone will be available for him.3.  Bone directed therapy: Delton See has been deferred he obtains dental clearance.  I recommended calcium and vitamin D supplements.   4.  Androgen depravation: he will receive Eligard today and repeat in 4 months.  Long-term complications including weight gain, hot flashes and osteoporosis were reviewed.  5.  Prognosis: Therapy remains  palliative although aggressive measures are warranted at this time.  His disease is incurable.  6.  Right inguinal mass: CT scan obtained in October 2020 showed possible hernia rather than lymphadenopathy.  We will continue to monitor.  7.  Follow-up: In 2 months for repeat follow-up.   25  minutes was spent with the patient face-to-face today.  More than 50% of time was spent on reviewing his disease status, treatment options and answering questions regarding future plan of care.    Zola Button, MD 12/22/20208:06 AM

## 2019-12-19 ENCOUNTER — Telehealth: Payer: Self-pay

## 2019-12-19 LAB — PROSTATE-SPECIFIC AG, SERUM (LABCORP): Prostate Specific Ag, Serum: 4.7 ng/mL — ABNORMAL HIGH (ref 0.0–4.0)

## 2019-12-19 NOTE — Telephone Encounter (Signed)
Called patient and made him aware of PSA results. Patient verbalized understanding.

## 2019-12-19 NOTE — Telephone Encounter (Signed)
-----   Message from [name], MD sent at 12/19/2019  8:43 AM EST ----- Please let him know his PSA is down.

## 2020-01-02 ENCOUNTER — Other Ambulatory Visit: Payer: Self-pay

## 2020-01-02 ENCOUNTER — Ambulatory Visit
Admission: RE | Admit: 2020-01-02 | Discharge: 2020-01-02 | Disposition: A | Discharge status: Home / Self Care | Payer: Medicare Other | Source: Ambulatory Visit | Attending: Radiation Oncology | Admitting: Radiation Oncology

## 2020-01-02 ENCOUNTER — Telehealth: Payer: Self-pay

## 2020-01-02 DIAGNOSIS — C7951 Secondary malignant neoplasm of bone: Secondary | ICD-10-CM | POA: Insufficient documentation

## 2020-01-02 DIAGNOSIS — C61 Malignant neoplasm of prostate: Secondary | ICD-10-CM | POA: Insufficient documentation

## 2020-01-02 MED FILL — XTANDI 40 MG CAPSULE: 40 | 30 days supply | Qty: 120 | Fill #0

## 2020-01-02 NOTE — Progress Notes (Signed)
Radiation Oncology         [phone] ________________________________  Name: Anthony Escobar MRN: [id number]  Date: 01/02/2020  DOB: 07-05-1951  Post Treatment Note  CC: Anthony Bill, MD  Anthony Bill, MD  Diagnosis:   70 y.o. gentleman with castrate-resistant prostate cancer with osseous metastases leading to pathologic fracture of the right hip.  Interval Since Last Radiation:  4 weeks; post op XRT 11/13/19-11/27/19:  The right hip was treated to 30 Gy in 10 fractions of 3 Gy s/p ORIF 10/22/19.   Narrative: I spoke with the patient to conduct his routine scheduled 1 month follow up visit via telephone to spare the patient unnecessary potential exposure in the healthcare setting during the current COVID-19 pandemic.  The patient was notified in advance and gave permission to proceed with this visit format. He tolerated radiation treatment relatively well without any acute ill side effects.                              On review of systems, the patient states that he is doing very well overall.  He has continued with significant improvement in the right hip pain and continues to work on exercises at home to regain strength and mobility in the hip.  He has continued taking oxycodone 5 mg twice daily to help manage his pain and maintain his activity level.  He does also report some discomfort/pain in the left shoulder, most noticeable first thing in the mornings and seems to improve throughout the day.  He thinks that this may be related to sleeping on his left side at night.  He denies any numbness or tingling in the lower extremities, or focal weakness.  Overall, he is quite pleased with his progress to date.  ALLERGIES:  has No Known Allergies.  Meds: Current Outpatient Medications  Medication Sig Dispense Refill  . albuterol (PROAIR HFA) 108 (90 Base) MCG/ACT inhaler Inhale 2 puffs into the lungs every 4 (four) hours as needed for wheezing or shortness of breath. 1  Inhaler 5  . amitriptyline (ELAVIL) 75 MG tablet Take 75 mg by mouth at bedtime.    . cholecalciferol (VITAMIN D3) 25 MCG (1000 UT) tablet Take 1,000 Units by mouth daily.    . Coenzyme Q10 (COQ10 PO) Take 1 tablet by mouth daily.    . enzalutamide (XTANDI) 40 MG capsule Take 4 capsules (160 mg total) by mouth daily. 120 capsule 2  . lisinopril (ZESTRIL) 20 MG tablet Take 20 mg by mouth daily.    . Multiple Vitamin (MULTIVITAMIN) tablet Take 1 tablet by mouth daily.    . rosuvastatin (CRESTOR) 10 MG tablet Take 10 mg by mouth at bedtime.    . Tiotropium Bromide Monohydrate (SPIRIVA RESPIMAT) 2.5 MCG/ACT AERS Inhale 2 puffs into the lungs daily. 2 Inhaler 0  . TURMERIC PO Take 2 tablets by mouth daily.    Marland Kitchen enoxaparin (LOVENOX) 40 MG/0.4ML injection Inject 0.4 mLs (40 mg total) into the skin daily. (Patient not taking: Reported on 11/06/2019) 0.4 mL 13  . oxyCODONE (OXY IR/ROXICODONE) 5 MG immediate release tablet Take 1-2 tablets (5-10 mg total) by mouth 2 (two) times daily as needed for severe pain. (Patient not taking: Reported on 01/02/2020) 30 tablet 0  . oxyCODONE-acetaminophen (PERCOCET) 5-325 MG tablet Take 1-2 tablets by mouth every 8 (eight) hours as needed for severe pain. (Patient not taking: Reported on 01/02/2020) 30 tablet 0  .  polyethylene glycol (MIRALAX / GLYCOLAX) 17 g packet Take 17 g by mouth daily. (Patient not taking: Reported on 01/02/2020) 14 each 0  . prochlorperazine (COMPAZINE) 10 MG tablet Take 1 tablet (10 mg total) by mouth every 6 (six) hours as needed for nausea or vomiting. (Patient not taking: Reported on 11/06/2019) 30 tablet 0  . rivaroxaban (XARELTO) 10 MG TABS tablet Take 1 tablet (10 mg total) by mouth daily. (Patient not taking: Reported on 01/02/2020) 30 tablet 0   No current facility-administered medications for this encounter.    Physical Findings:  vitals were not taken for this visit.   /Unable to assess due to telephone follow up visit format.   Lab  Findings: Lab Results  Component Value Date   WBC 6.5 12/18/2019   HGB 12.6 (L) 12/18/2019   HCT 39.1 12/18/2019   MCV 99.0 12/18/2019   PLT 401 (H) 12/18/2019     Radiographic Findings: XR FEMUR, MIN 2 VIEWS RIGHT  Result Date: 12/04/2019 Stable fixation of pathologic fracture.  No complications.  No lucency of the fracture.   Impression/Plan: 1. 69 y.o. gentleman with castrate-resistant prostate cancer with osseous metastases leading to pathologic fracture of the right hip. He appears to have recovered well from the effects of his recent radiotherapy and is currently without complaints.  We discussed that while we are happy to continue to participate in his care if clinically indicated, at this point, we will plan to see him back on an as needed basis only. We would be more than happy to see him back in the future to discuss Trudi Ida should there be any evidence of continued disease progression in the bony skeleton.  He will continue in routine follow up with Dr. Alen Blew for continued management of his systemic disease with current plans to continue ADT (4 month Eligard given 12/18/19) and Xtandi.  Most recent PSA from 12/18/19 was 4.7, decreased from 12.8 in 09/2019.  He will continue to monitor the pain in the left shoulder and will communicate with Dr. Alen Blew should the pain progressively worsen or become less tolerable and we could consider additional imaging at that time for further evaluation.  He knows to call at any time with any questions or concerns related to his previous radiation.    Anthony Johns, PA-C

## 2020-01-02 NOTE — Telephone Encounter (Signed)
Oral Oncology Patient Advocate Encounter  Patient has been approved for copay assistance with [employer] (TAF).  [employer] [facility] [facility] will cover all copayment expenses for Xtandi for the remainder of the calendar year.    The billing information is as follows and has been shared with [employer].   Member ID: [id number] Group ID: [id number] PCN: [id number] BIN: [id number] Eligibility Dates: 12/28/19 to 12/26/20  Fund: [facility] [place] Phone [phone] Fax [phone] 01/02/2020 11:26 AM

## 2020-01-04 ENCOUNTER — Other Ambulatory Visit: Payer: Self-pay | Admitting: Oncology

## 2020-01-04 MED ORDER — OXYCODONE HCL 5 MG PO TABS: 5.0000 mg | ORAL_TABLET | Freq: Two times a day (BID) | ORAL | 0 refills | Status: DC | PRN

## 2020-02-04 MED FILL — XTANDI 40 MG CAPSULE: 40 | 30 days supply | Qty: 120 | Fill #1

## 2020-02-19 ENCOUNTER — Inpatient Hospital Stay: Payer: Medicare Other | Attending: Oncology | Admitting: Oncology

## 2020-02-19 ENCOUNTER — Inpatient Hospital Stay: Payer: Medicare Other

## 2020-02-19 ENCOUNTER — Other Ambulatory Visit: Payer: Self-pay

## 2020-02-19 VITALS — BP 171/100 | HR 95 | Temp 97.6°F | Resp 18 | Wt 153.0 lb

## 2020-02-19 DIAGNOSIS — C7951 Secondary malignant neoplasm of bone: Secondary | ICD-10-CM | POA: Insufficient documentation

## 2020-02-19 DIAGNOSIS — Z79899 Other long term (current) drug therapy: Secondary | ICD-10-CM | POA: Insufficient documentation

## 2020-02-19 DIAGNOSIS — C61 Malignant neoplasm of prostate: Secondary | ICD-10-CM | POA: Diagnosis present

## 2020-02-19 DIAGNOSIS — M25559 Pain in unspecified hip: Secondary | ICD-10-CM | POA: Diagnosis not present

## 2020-02-19 DIAGNOSIS — Z7901 Long term (current) use of anticoagulants: Secondary | ICD-10-CM | POA: Insufficient documentation

## 2020-02-19 DIAGNOSIS — M1990 Unspecified osteoarthritis, unspecified site: Secondary | ICD-10-CM | POA: Diagnosis not present

## 2020-02-19 LAB — CBC WITH DIFFERENTIAL (CANCER CENTER ONLY)
Abs Immature Granulocytes: 0.01 10*3/uL (ref 0.00–0.07)
Basophils Absolute: 0 10*3/uL (ref 0.0–0.1)
Basophils Relative: 0 %
Eosinophils Absolute: 0.1 10*3/uL (ref 0.0–0.5)
Eosinophils Relative: 2 %
HCT: 40.1 % (ref 39.0–52.0)
Hemoglobin: 13.2 g/dL (ref 13.0–17.0)
Immature Granulocytes: 0 %
Lymphocytes Relative: 15 %
Lymphs Abs: 1.1 10*3/uL (ref 0.7–4.0)
MCH: 32.5 pg (ref 26.0–34.0)
MCHC: 32.9 g/dL (ref 30.0–36.0)
MCV: 98.8 fL (ref 80.0–100.0)
Monocytes Absolute: 0.7 10*3/uL (ref 0.1–1.0)
Monocytes Relative: 10 %
Neutro Abs: 5.5 10*3/uL (ref 1.7–7.7)
Neutrophils Relative %: 73 %
Platelet Count: 385 10*3/uL (ref 150–400)
RBC: 4.06 MIL/uL — ABNORMAL LOW (ref 4.22–5.81)
RDW: 13.8 % (ref 11.5–15.5)
WBC Count: 7.4 10*3/uL (ref 4.0–10.5)
nRBC: 0 % (ref 0.0–0.2)

## 2020-02-19 LAB — CMP (CANCER CENTER ONLY)
ALT: 13 U/L (ref 0–44)
AST: 22 U/L (ref 15–41)
Albumin: 3.9 g/dL (ref 3.5–5.0)
Alkaline Phosphatase: 119 U/L (ref 38–126)
Anion gap: 8 (ref 5–15)
BUN: 14 mg/dL (ref 8–23)
CO2: 27 mmol/L (ref 22–32)
Calcium: 9.8 mg/dL (ref 8.9–10.3)
Chloride: 103 mmol/L (ref 98–111)
Creatinine: 0.83 mg/dL (ref 0.61–1.24)
GFR, Est AFR Am: >60 mL/min (ref >60)
GFR, Estimated: >60 mL/min (ref >60)
Glucose, Bld: 112 mg/dL — ABNORMAL HIGH (ref 70–99)
Potassium: 4.9 mmol/L (ref 3.5–5.1)
Sodium: 138 mmol/L (ref 135–145)
Total Bilirubin: 0.3 mg/dL (ref 0.3–1.2)
Total Protein: 7.4 g/dL (ref 6.5–8.1)

## 2020-02-19 MED ORDER — LISINOPRIL 20 MG PO TABS: 20.0000 mg | ORAL_TABLET | Freq: Every day | ORAL | 3 refills | Status: DC

## 2020-02-19 MED ORDER — OXYCODONE HCL 5 MG PO TABS: 5.0000 mg | ORAL_TABLET | Freq: Two times a day (BID) | ORAL | 0 refills | Status: DC | PRN

## 2020-02-19 NOTE — Progress Notes (Signed)
Hematology and Oncology Follow Up Visit  Anthony Escobar [id number] 08-14-1951 69 y.o. 02/19/2020 8:21 AM Anthony Escobar, MDBoyd, Anthony Factor, MD   Principle Diagnosis: 69 year old man with castration-resistant prostate cancer with disease to the bone diagnosed in 2019.  He presented with advanced disease and Gleason score 4+4 = 8 and PSA of 19.57.    Prior Therapy:  He is status post prostate biopsy on December 29, 2017 showed a Gleason score 4+4 = 8 and 5 cores.   Taxotere chemotherapy with the first cycle of chemotherapy given on February 02, 2018.  He completed 6 cycles of therapy on May 18, 2018.  He is status post treatment of a intertrochanteric fracture and intramedullary implant completed on October 22, 2019 after sustaining a pathological fracture of the right hip.  He is status post right hip radiation for a total of 30 Gy in 10 fractions completed on November 27, 2019.  Current therapy:  Androgen deprivation therapy.  He received Eligard in December 2020 and will be repeated in 4 months.    Xtandi 160 mg daily started in March of 2020.  Interim History: Anthony Escobar presents today for a repeat evaluation.  Since the last visit, ports no major changes in his health.  He is eating better and has gained more weight.  Continues to have hip pain and continues to require oxycodone 2 tablets a day.  He reports morning stiffness but the pain improves during the day with mobility.  He denies any recent falls or syncope.  He denies any pathological fractures.  Medications: Reviewed without changes. Current Outpatient Medications  Medication Sig Dispense Refill  . albuterol (PROAIR HFA) 108 (90 Base) MCG/ACT inhaler Inhale 2 puffs into the lungs every 4 (four) hours as needed for wheezing or shortness of breath. 1 Inhaler 5  . amitriptyline (ELAVIL) 75 MG tablet Take 75 mg by mouth at bedtime.    . cholecalciferol (VITAMIN D3) 25 MCG (1000 UT) tablet Take 1,000 Units by mouth  daily.    . Coenzyme Q10 (COQ10 PO) Take 1 tablet by mouth daily.    Marland Kitchen enoxaparin (LOVENOX) 40 MG/0.4ML injection Inject 0.4 mLs (40 mg total) into the skin daily. (Patient not taking: Reported on 11/06/2019) 0.4 mL 13  . enzalutamide (XTANDI) 40 MG capsule Take 4 capsules (160 mg total) by mouth daily. 120 capsule 2  . lisinopril (ZESTRIL) 20 MG tablet Take 20 mg by mouth daily.    . Multiple Vitamin (MULTIVITAMIN) tablet Take 1 tablet by mouth daily.    Marland Kitchen oxyCODONE (OXY IR/ROXICODONE) 5 MG immediate release tablet Take 1-2 tablets (5-10 mg total) by mouth 2 (two) times daily as needed for severe pain. 30 tablet 0  . oxyCODONE-acetaminophen (PERCOCET) 5-325 MG tablet Take 1-2 tablets by mouth every 8 (eight) hours as needed for severe pain. (Patient not taking: Reported on 01/02/2020) 30 tablet 0  . polyethylene glycol (MIRALAX / GLYCOLAX) 17 g packet Take 17 g by mouth daily. (Patient not taking: Reported on 01/02/2020) 14 each 0  . prochlorperazine (COMPAZINE) 10 MG tablet Take 1 tablet (10 mg total) by mouth every 6 (six) hours as needed for nausea or vomiting. (Patient not taking: Reported on 11/06/2019) 30 tablet 0  . rivaroxaban (XARELTO) 10 MG TABS tablet Take 1 tablet (10 mg total) by mouth daily. (Patient not taking: Reported on 01/02/2020) 30 tablet 0  . rosuvastatin (CRESTOR) 10 MG tablet Take 10 mg by mouth at bedtime.    . Tiotropium Bromide  Monohydrate (SPIRIVA RESPIMAT) 2.5 MCG/ACT AERS Inhale 2 puffs into the lungs daily. 2 Inhaler 0  . TURMERIC PO Take 2 tablets by mouth daily.     No current facility-administered medications for this visit.     Allergies: No Known Allergies    Physical Exam:    Blood pressure (!) 171/100, pulse 95, temperature 97.6 F (36.4 C), temperature source Temporal, resp. rate 18, weight 153 lb (69.4 kg), SpO2 99 %.    ECOG: 0   General appearance: Alert, awake without any distress. Head: Atraumatic without abnormalities Oropharynx: Without any  thrush or ulcers. Eyes: No scleral icterus. Lymph nodes: No lymphadenopathy noted in the cervical, supraclavicular, or axillary nodes Heart:regular rate and rhythm, without any murmurs or gallops.   Lung: Clear to auscultation without any rhonchi, wheezes or dullness to percussion. Abdomin: Soft, nontender without any shifting dullness or ascites. Musculoskeletal: No clubbing or cyanosis. Neurological: No motor or sensory deficits. Skin: No rashes or lesions.            Lab Results: Lab Results  Component Value Date   WBC 6.5 12/18/2019   HGB 12.6 (L) 12/18/2019   HCT 39.1 12/18/2019   MCV 99.0 12/18/2019   PLT 401 (H) 12/18/2019     Chemistry      Component Value Date/Time   NA 138 12/18/2019 0750   K 4.0 12/18/2019 0750   CL 100 12/18/2019 0750   CO2 25 12/18/2019 0750   BUN 16 12/18/2019 0750   CREATININE 0.93 12/18/2019 0750   CREATININE 0.88 05/23/2013 1422      Component Value Date/Time   CALCIUM 9.8 12/18/2019 0750   ALKPHOS 82 12/18/2019 0750   AST 14 (L) 12/18/2019 0750   ALT 6 12/18/2019 0750   BILITOT 0.4 12/18/2019 0750        Results for Anthony Escobar (MRN [id number]) as of 02/19/2020 08:04  Ref. Range 10/16/2019 08:24 12/18/2019 07:50  Prostate Specific Ag, Serum Latest Ref Range: 0.0 - 4.0 ng/mL 12.8 (H) 4.7 (H)      Impression and Plan:  69 year old man with:   1.    Castration-resistant prostate cancer diagnosed in 2019.  He has extensive disease to the bone.     He remains on Xtandi with reasonable PSA response.  His PSA did go down in December after radiation therapy and surgery to his hip.  The natural course of this disease and alternative treatment options were reviewed with the majority of his metastasis to the right hip that has been treated his PSA appears to have declined.  I have recommended restaging at this time with CT scan and bone scan point and consideration for different salvage therapy.  These options are in the  form of systemic chemotherapy or Xofigo depending on the pattern of progression.  His PSA continues to decline or stabilize we will continue with Xtandi at this time.  He is agreeable with this plan and will determine best course of action after repeat staging work-up.   2.    Right hip fracture: Improved after radiation and fixation.  He continues to have periodic pain and arthritis for which she uses oxycodone.   4.  Androgen depravation: He will receive Eligard in 2 months.  Long-term complication: Osteoporosis and full dysfunction were reiterated.  5.  Prognosis: Disease remains incurable although aggressive measures are warranted given his excellent performance status.  6.  Bone directed therapy: I have recommended continuing calcium and vitamin D.  I will refer  him to dental medicine for evaluation prior to potential Xgeva.  He has not had any dental evaluation and feels that he has dental issues including teeth that might be need to be pulled.  7.  Hypertension: He is on lisinopril although he has not been taking it and ran out recently.  I urged him to establish care with his primary care provider.  8.  Follow-up: He will return in 8 weeks after repeat staging work-up.   30 minutes was dedicated to this review.  The time was dedicated to reviewing his disease status, treatment options and addressing complications related to therapy.      Zola Button, MD 2/23/20218:21 AM

## 2020-02-20 ENCOUNTER — Telehealth: Payer: Self-pay | Admitting: Oncology

## 2020-02-20 ENCOUNTER — Telehealth: Payer: Self-pay

## 2020-02-20 LAB — PROSTATE-SPECIFIC AG, SERUM (LABCORP): Prostate Specific Ag, Serum: 11.2 ng/mL — ABNORMAL HIGH (ref 0.0–4.0)

## 2020-02-20 NOTE — Telephone Encounter (Signed)
Scheduled appt per 2/23 los.  Sent a calendar to HIM pool to get a calendar mailed out.

## 2020-02-20 NOTE — Telephone Encounter (Signed)
-----   Message from [name], MD sent at 02/20/2020  8:41 AM EST ----- Please let him know his PSA is up. No changes for now.

## 2020-02-20 NOTE — Telephone Encounter (Signed)
Called patient and made him aware of PSA result. Patient verbalized understanding.

## 2020-02-21 ENCOUNTER — Other Ambulatory Visit (HOSPITAL_COMMUNITY): Payer: Medicare Other | Admitting: Dentistry

## 2020-02-26 ENCOUNTER — Telehealth: Payer: Self-pay

## 2020-02-26 NOTE — Telephone Encounter (Signed)
Called and informed patient of information list below. Patient verbalized understanding.  Patient also given phone number to [facility] Dept of Dental Medicine [phone]) as patient stated he missed his appointment last week due to car troubles. Patient instructed to call office with any questions or concerns.

## 2020-02-26 NOTE — Telephone Encounter (Signed)
-----   Message from [name], MD sent at 02/26/2020  9:17 AM EST ----- Regarding: RE: Patient question He needs to call the orthopedic doctor that operated on him to evaluate his knee.  I will not be prescribing a different medication for the time being.  He can continue to use oxycodone every 6 hours as needed.  Thanks ----- Message ----- From: Teodoro Spray, RN Sent: 02/26/2020   8:47 AM EST To: [name], MD Subject: Patient question                               Patient called office stating his pain in his R knee and hip have started to become worse in the last 5-6 days. Patient states he has been taking his oxycodone 5mg  twice daily and that it does not seem to relieve the pain. Patient wants to know if there is something else he can take to help relieve the pain.   Please advise.

## 2020-02-27 ENCOUNTER — Encounter (HOSPITAL_COMMUNITY): Payer: Self-pay | Admitting: Dentistry

## 2020-02-27 ENCOUNTER — Other Ambulatory Visit: Payer: Self-pay

## 2020-02-27 ENCOUNTER — Ambulatory Visit (HOSPITAL_COMMUNITY): Payer: Self-pay | Admitting: Dentistry

## 2020-02-27 VITALS — BP 152/105 | HR 96 | Temp 98.2°F

## 2020-02-27 DIAGNOSIS — M270 Developmental disorders of jaws: Secondary | ICD-10-CM

## 2020-02-27 DIAGNOSIS — C61 Malignant neoplasm of prostate: Secondary | ICD-10-CM | POA: Diagnosis not present

## 2020-02-27 DIAGNOSIS — F40232 Fear of other medical care: Secondary | ICD-10-CM

## 2020-02-27 DIAGNOSIS — K030 Excessive attrition of teeth: Secondary | ICD-10-CM

## 2020-02-27 DIAGNOSIS — K06010 Localized gingival recession, unspecified: Secondary | ICD-10-CM

## 2020-02-27 DIAGNOSIS — Z01818 Encounter for other preprocedural examination: Secondary | ICD-10-CM

## 2020-02-27 DIAGNOSIS — K083 Retained dental root: Secondary | ICD-10-CM

## 2020-02-27 DIAGNOSIS — K011 Impacted teeth: Secondary | ICD-10-CM

## 2020-02-27 DIAGNOSIS — K045 Chronic apical periodontitis: Secondary | ICD-10-CM

## 2020-02-27 DIAGNOSIS — K036 Deposits [accretions] on teeth: Secondary | ICD-10-CM

## 2020-02-27 DIAGNOSIS — K0530 Chronic periodontitis, unspecified: Secondary | ICD-10-CM

## 2020-02-27 DIAGNOSIS — C7951 Secondary malignant neoplasm of bone: Secondary | ICD-10-CM

## 2020-02-27 DIAGNOSIS — K029 Dental caries, unspecified: Secondary | ICD-10-CM

## 2020-02-27 DIAGNOSIS — K08409 Partial loss of teeth, unspecified cause, unspecified class: Secondary | ICD-10-CM

## 2020-02-27 DIAGNOSIS — K0850 Unsatisfactory restoration of tooth, unspecified: Secondary | ICD-10-CM

## 2020-02-27 NOTE — Progress Notes (Signed)
DENTAL CONSULTATION  Date of Consultation:  02/27/2020 Patient Name:   Anthony Escobar Date of Birth:   09-04-1951 Medical Record Number: [id number]  COVID 19 SCREENING: The patient does not symptoms concerning for COVID-19 infection (Including fever, chills, cough, or new SHORTNESS OF BREATH).    VITALS: BP (!) 152/105 (BP Location: Right Arm)   Pulse 96   Temp 98.2 F (36.8 C)   CHIEF COMPLAINT: Patient was referred by Dr. Alen Blew for a dental consultation.  HPI: Anthony Escobar is a 69 year old male with history of prostate cancer with bone metastases.  Patient with current XTANDI chemotherapy and anticipated use of Xgeva therapy.  Patient is now seen as part of a medically necessary pre-Xgeva therapy dental protocol.  The patient currently denies acute toothaches, swellings, or abscesses.  Patient was last seen for a deep cleaning and restoration approximately 6 to 7 years ago at [facility].  Patient has not been seeing the dentist due to economic concerns.  Patient denies having partial dentures.  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  PROBLEM LIST: Patient Active Problem List   Diagnosis Date Noted  . Prostate cancer metastatic to bone [facility]) 11/09/2019    Priority: High  . Hyperkalemia 10/19/2019  . Hyponatremia 10/19/2019  . Pathologic intertrochanteric fracture, right, initial encounter ([place]) 10/17/2019  . Acute bronchitis 12/15/2018  . Encounter for antineoplastic chemotherapy 04/27/2018  . Prostate cancer ([place]) 01/19/2018  . Multiple pulmonary nodules 10/28/2017  . COPD (chronic obstructive pulmonary disease) ([place]) 10/18/2017  . Tobacco use 10/18/2017    PMH: Past Medical History:  Diagnosis Date  . Hyperlipidemia   . Hypertension   . Prostate cancer ([place])     PSH: Past Surgical History:  Procedure Laterality Date  . HERNIA REPAIR  1988   bilateral inguinal  . INTRAMEDULLARY (IM) NAIL INTERTROCHANTERIC Right 10/22/2019   Procedure: RIGHT  INTERTROCHANTERIC INTRAMEDULLARY (IM) NAIL;  Surgeon: Leandrew Koyanagi, MD;  Location: [facility] [place];  Service: Orthopedics;  Laterality: Right;    ALLERGIES: No Known Allergies  MEDICATIONS: Current Outpatient Medications  Medication Sig Dispense Refill  . albuterol (PROAIR HFA) 108 (90 Base) MCG/ACT inhaler Inhale 2 puffs into the lungs every 4 (four) hours as needed for wheezing or shortness of breath. 1 Inhaler 5  . amitriptyline (ELAVIL) 75 MG tablet Take 75 mg by mouth at bedtime.    . cholecalciferol (VITAMIN D3) 25 MCG (1000 UT) tablet Take 1,000 Units by mouth daily.    . Coenzyme Q10 (COQ10 PO) Take 1 tablet by mouth daily.    . enzalutamide (XTANDI) 40 MG capsule Take 4 capsules (160 mg total) by mouth daily. 120 capsule 2  . lisinopril (ZESTRIL) 20 MG tablet Take 1 tablet (20 mg total) by mouth daily. 90 tablet 3  . Multiple Vitamin (MULTIVITAMIN) tablet Take 1 tablet by mouth daily.    Marland Kitchen oxyCODONE (OXY IR/ROXICODONE) 5 MG immediate release tablet Take 1-2 tablets (5-10 mg total) by mouth 2 (two) times daily as needed for severe pain. 60 tablet 0  . Tiotropium Bromide Monohydrate (SPIRIVA RESPIMAT) 2.5 MCG/ACT AERS Inhale 2 puffs into the lungs daily. 2 Inhaler 0  . TURMERIC PO Take 2 tablets by mouth daily.    . polyethylene glycol (MIRALAX / GLYCOLAX) 17 g packet Take 17 g by mouth daily. (Patient not taking: Reported on 01/02/2020) 14 each 0  . prochlorperazine (COMPAZINE) 10 MG tablet Take 1 tablet (10 mg total) by mouth every 6 (six) hours as  needed for nausea or vomiting. (Patient not taking: Reported on 11/06/2019) 30 tablet 0  . rivaroxaban (XARELTO) 10 MG TABS tablet Take 1 tablet (10 mg total) by mouth daily. (Patient not taking: Reported on 01/02/2020) 30 tablet 0  . rosuvastatin (CRESTOR) 10 MG tablet Take 10 mg by mouth at bedtime.     No current facility-administered medications for this visit.    LABS: Lab Results  Component Value Date   WBC 7.4 02/19/2020   HGB 13.2  02/19/2020   HCT 40.1 02/19/2020   MCV 98.8 02/19/2020   PLT 385 02/19/2020      Component Value Date/Time   NA 138 02/19/2020 0808   K 4.9 02/19/2020 0808   CL 103 02/19/2020 0808   CO2 27 02/19/2020 0808   GLUCOSE 112 (H) 02/19/2020 0808   BUN 14 02/19/2020 0808   CREATININE 0.83 02/19/2020 0808   CREATININE 0.88 05/23/2013 1422   CALCIUM 9.8 02/19/2020 0808   GFRNONAA >60 02/19/2020 0808   GFRAA >60 02/19/2020 0808   No results found for: INR, PROTIME No results found for: PTT  SOCIAL HISTORY: Social History   Socioeconomic History  . Marital status: Divorced    Spouse name: Not on file  . Number of children: 1  . Years of education: Not on file  . Highest education level: Not on file  Occupational History  . Not on file  Tobacco Use  . Smoking status: Former Smoker    Packs/day: 1.50    Years: 50.00    Pack years: 75.00    Types: Cigarettes    Quit date: 08/20/2011    Years since quitting: 8.5  . Smokeless tobacco: Never Used  Substance and Sexual Activity  . Alcohol use: Yes    Comment: 1beer a day  . Drug use: No  . Sexual activity: Not Currently  Other Topics Concern  . Not on file  Social History Narrative  . Not on file   Social Determinants of Health   Financial Resource Strain:   . Difficulty of Paying Living Expenses: Not on file  Food Insecurity:   . Worried About Charity fundraiser in the Last Year: Not on file  . Ran Out of Food in the Last Year: Not on file  Transportation Needs:   . Lack of Transportation (Medical): Not on file  . Lack of Transportation (Non-Medical): Not on file  Physical Activity:   . Days of Exercise per Week: Not on file  . Minutes of Exercise per Session: Not on file  Stress:   . Feeling of Stress : Not on file  Social Connections:   . Frequency of Communication with Friends and Family: Not on file  . Frequency of Social Gatherings with Friends and Family: Not on file  . Attends Religious Services: Not on  file  . Active Member of Clubs or Organizations: Not on file  . Attends Archivist Meetings: Not on file  . Marital Status: Not on file  Intimate Partner Violence:   . Fear of Current or Ex-Partner: Not on file  . Emotionally Abused: Not on file  . Physically Abused: Not on file  . Sexually Abused: Not on file    FAMILY HISTORY: Family History  Problem Relation Age of Onset  . Stroke Mother   . Cancer Father        esophageal  . Cancer Paternal Grandmother 34       unknown  . Prostate cancer Neg Hx   .  Breast cancer Neg Hx   . Colon cancer Neg Hx     REVIEW OF SYSTEMS: Reviewed with the patient as per History of present illness. 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    DENTAL HISTORY: CHIEF COMPLAINT: Patient was referred by Dr. Alen Blew for a dental consultation.  HPI: Anthony Escobar is a 69 year old male with history of prostate cancer with bone metastases.  Patient with current XTANDI chemotherapy and anticipated use of Xgeva therapy.  Patient is now seen as part of a medically necessary pre-Xgeva therapy dental protocol.  The patient currently denies acute toothaches, swellings, or abscesses.  Patient was last seen for a deep cleaning and restoration approximately 6 to 7 years ago at [facility].  Patient has not been seeing the dentist due to economic concerns.  Patient denies having partial dentures.  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   DENTAL EXAMINATION: GENERAL: The patient is a well-developed, well-nourished male in no acute distress. HEAD AND NECK: There is no palpable neck lymphadenopathy.  The patient denies acute TMJ symptoms.  The patient, however does have bilateral TMJ crepitus. INTRAORAL EXAM: Patient has normal saliva.  Patient has bilateral, small mandibular lingual tori.  There is no evidence of oral abscess formation within the mouth. DENTITION: Patient is missing tooth #1.  Patient has retained root segments  in the area of tooth numbers 30 and 31.  Patient has impacted tooth numbers 16, 17, and 32.  There is evidence of mandibular anterior incisal attrition.  Multiple flexure lesions are noted. PERIODONTAL: Patient has chronic periodontitis with plaque and calculus accumulations, gingival recession, and incipient to moderate bone loss.  No significant tooth mobility is noted. DENTAL CARIES/SUBOPTIMAL RESTORATIONS: Multiple dental caries and suboptimal dental restorations are noted as per dental charting form multiple flexure lesions are noted. ENDODONTIC: Patient denies acute pulpitis symptoms.  Patient does have periapical pathology and radiolucency associated with tooth numbers 30 and 31. CROWN AND BRIDGE:There are PFM crown restorations on tooth numbers 7 through 10. PROSTHODONTIC: Patient denies having partial dentures. OCCLUSION: Patient has a poor occlusal scheme secondary to missing teeth, retained root segments, crossbite in the area of tooth numbers 4/5 with tooth #29, and malpositioned teeth.  The occlusion is stable, however.  RADIOGRAPHIC INTERPRETATION: An orthopantogram was taken today and supplemented with a full series of dental radiographs. There is missing tooth #1.  Tooth numbers 16, 17, and 32 are impacted.  There are retained root segments in the area of tooth numbers 30 and 31.  There is periapical radiolucency at the apices of tooth numbers 30 and 31.  Dental  caries and suboptimal dental restorations are noted.  There are PFM crowns on tooth numbers 7 through 10.  There is evidence of mandibular anterior incisal attrition.   ASSESSMENTS: 1.  Prostate cancer with bone metastases 2.  Pre-Xgeva therapy dental protocol 3.  Chronic apical periodontitis 4.  Dental caries 5.  Retained root segments 6.  Multiple impacted wisdom teeth  #'s 16, 70, and 32. 7.  Multiple flexure lesions 8. Mandibular anterior incisal attrition 9.  Bilateral mandibular lingual tori-small 10.  Multiple  suboptimal dental restorations 11.  Crossbite in the area of tooth numbers 4 and 5 with tooth #29 12.  Poor occlusal scheme and malocclusion 13.  Risk for bleeding with invasive dental procedures if patient is taking Xarelto therapy. 14.  Risk for osteonecrosis of the jaw and with invasive dental procedures once patient starts taking  Xarelto therapy.     PLAN/RECOMMENDATIONS: 1. I discussed the risks, benefits, and complications of various treatment options with the patient in relationship to his medical and dental conditions, anticipated Xgeva therapy, and risk for osteonecrosis of the jaw with invasive dental procedures after Xarelto therapy is started.  We discussed various treatment options to include no treatment, multiple extractions with alveoloplasty, pre-prosthetic surgery as indicated, periodontal therapy, dental restorations, root canal therapy, crown and bridge therapy, implant therapy, and replacement of missing teeth as indicated.  We also discussed referral to an oral surgeon for extraction of tooth numbers 30, 31, and 32 as well as evaluation for extraction of tooth #16 and 17 if indicated.  We also discussed referral back to his primary dentist for restoration on tooth numbers 13 and 18 along with periodontal therapy.  The patient the patient currently wishes to proceed with referral to Dr. Frederik Schmidt for evaluation for surgical extractions as indicated.  Patient also agrees to follow-up with his primary dentist at [facility] from dental for periodontal therapy, and dental restorations as indicated.  Dr. Alen Blew has been contacted and will continue to hold the Xgeva therapy until after adequate healing from the anticipated dental extractions.  Dr. Alen Blew does not feel that the patient is currently on Xarelto therapy but I will check with Dr.Xu to confirm this.   2. Discussion of findings with medical team and coordination of future medical and dental care as needed.  I spent in excess of 120  minutes during the conduct of this consultation and >50% of this time involved direct face-to-face encounter for counseling and/or coordination of the patient's care.    Lenn Cal, DDS

## 2020-02-27 NOTE — Patient Instructions (Addendum)
COVID-19 Education: The signs and symptoms of COVID-19 were discussed with the patient and how to seek care for testing (follow up with PCP or arrange E-visit).   The importance of social distancing was discussed today.  COVID-19 Vaccine Information can be found at: ShippingScam.co.uk For questions related to vaccine distribution or appointments, please email [email or web address] or call [phone].   Patient being referred to oral surgeon for evaluation for extraction of tooth numbers 30, 31, and 32 and possible extraction tooth numbers 16 and 17.   Patient is to follow-up with his primary dentist for evaluation for restoration of tooth #13 and 18 as well as periodontal maintenance procedure. Lenn Cal, DDS

## 2020-03-04 ENCOUNTER — Ambulatory Visit: Payer: Medicare Other | Admitting: Orthopaedic Surgery

## 2020-03-12 ENCOUNTER — Ambulatory Visit: Payer: Medicare Other | Admitting: Orthopaedic Surgery

## 2020-03-25 MED FILL — XTANDI 40 MG CAPSULE: 40 | 30 days supply | Qty: 120 | Fill #2

## 2020-03-27 ENCOUNTER — Other Ambulatory Visit: Payer: Self-pay

## 2020-03-27 DIAGNOSIS — C61 Malignant neoplasm of prostate: Secondary | ICD-10-CM

## 2020-03-27 MED ORDER — XTANDI 40 MG PO CAPS: 160.0000 mg | ORAL_CAPSULE | Freq: Every day | ORAL | 2 refills | Status: DC

## 2020-04-02 ENCOUNTER — Telehealth: Payer: Self-pay | Admitting: Orthopaedic Surgery

## 2020-04-02 NOTE — Telephone Encounter (Signed)
Patient called needing Rx refilled (Oxycodone) The number to contact patient is [phone]

## 2020-04-02 NOTE — Telephone Encounter (Signed)
He never came back for f/u po appt.  Cannot refill at this point

## 2020-04-03 NOTE — Telephone Encounter (Signed)
Called patient to advise  °

## 2020-04-08 ENCOUNTER — Ambulatory Visit: Payer: Self-pay

## 2020-04-08 ENCOUNTER — Encounter: Payer: Self-pay | Admitting: Orthopaedic Surgery

## 2020-04-08 ENCOUNTER — Ambulatory Visit: Payer: Medicare Other | Admitting: Orthopaedic Surgery

## 2020-04-08 ENCOUNTER — Other Ambulatory Visit: Payer: Self-pay

## 2020-04-08 DIAGNOSIS — M25562 Pain in left knee: Secondary | ICD-10-CM

## 2020-04-08 DIAGNOSIS — M84451A Pathological fracture, right femur, initial encounter for fracture: Secondary | ICD-10-CM | POA: Diagnosis not present

## 2020-04-08 DIAGNOSIS — G8929 Other chronic pain: Secondary | ICD-10-CM | POA: Diagnosis not present

## 2020-04-08 NOTE — Progress Notes (Signed)
Office Visit Note   Patient: Anthony Escobar Escobar           Date of Birth: August 02, 1951           MRN: [id number] Visit Date: 04/08/2020              Requested by: [name], [address],  [place] [address] PCP: [name], MD   Assessment & Plan: Visit Diagnoses:  1. Pathologic intertrochanteric fracture, right, initial encounter ([place])   2. Chronic pain of left knee     Plan: Impression is stable right pathologic intertrochanteric fracture.  He has undergone 10 radiation treatments for this.  He may continue to increase activity as tolerated.  We will follow his healing with serial x-rays.  For the proximal tibia lesion will need to obtain an urgent MRI to evaluate for metastatic disease.  Questions encouraged and answered.  Follow-Up Instructions: Return in about 1 week (around 04/15/2020).   Orders:  Orders Placed This Encounter  Procedures  . XR HIP UNILAT W OR W/O PELVIS 2-3 VIEWS RIGHT  . XR KNEE 3 VIEW LEFT   No orders of the defined types were placed in this encounter.     Procedures: No procedures performed   Clinical Data: No additional findings.   Subjective: Chief Complaint  Patient presents with  . Left Knee - Pain  . Right Hip - Pain    Anthony Escobar is a 69 year old gentleman who is status post IM nailing of a right intertrochanteric pathologic fracture from metastatic prostate cancer.  He is doing well in that regard.  He denies any groin pain.  He has some discomfort when he sleeps on the right side.  He is mainly having issues with a painful swollen mass on the anterior aspect of the proximal tibia.  This causing pain with weightbearing.   Review of Systems  Constitutional: Negative.   All other systems reviewed and are negative.    Objective: Vital Signs: There were no vitals taken for this visit.  Physical Exam Vitals and nursing note reviewed.  Constitutional:      Appearance: He is  well-developed.  Pulmonary:     Effort: Pulmonary effort is normal.  Abdominal:     Palpations: Abdomen is soft.  Skin:    General: Skin is warm.  Neurological:     Mental Status: He is alert and oriented to person, place, and time.  Psychiatric:        Behavior: Behavior normal.        Thought Content: Thought content normal.        Judgment: Judgment normal.     Ortho Exam Right hip exam is relatively unremarkable.  Left knee shows a painful swollen mass on the anterior aspect of the proximal tibia.  No overlying skin changes.  No open wounds. Specialty Comments:  No specialty comments available.  Imaging: XR HIP UNILAT W OR W/O PELVIS 2-3 VIEWS RIGHT  Result Date: 04/08/2020 Stable fixation of intertrochanteric pathologic fracture  XR KNEE 3 VIEW LEFT  Result Date: 04/08/2020 There is an intraosseous circular lesion in the proximal tibia    PMFS History: Patient Active Problem List   Diagnosis Date Noted  . Prostate cancer metastatic to bone ([place]) 11/09/2019  . Hyperkalemia 10/19/2019  . Hyponatremia 10/19/2019  . Pathologic intertrochanteric fracture, right, initial encounter ([place]) 10/17/2019  . Acute bronchitis 12/15/2018  . Encounter for antineoplastic chemotherapy 04/27/2018  . Prostate cancer ([place])  01/19/2018  . Multiple pulmonary nodules 10/28/2017  . COPD (chronic obstructive pulmonary disease) ([place]) 10/18/2017  . Tobacco use 10/18/2017   Past Medical History:  Diagnosis Date  . Hyperlipidemia   . Hypertension   . Prostate cancer [facility])     Family History  Problem Relation Age of Onset  . Stroke Mother   . Cancer Father        esophageal  . Cancer Paternal Grandmother 61       unknown  . Prostate cancer Neg Hx   . Breast cancer Neg Hx   . Colon cancer Neg Hx     Past Surgical History:  Procedure Laterality Date  . HERNIA REPAIR  1988   bilateral inguinal  . INTRAMEDULLARY (IM) NAIL INTERTROCHANTERIC Right 10/22/2019   Procedure: RIGHT  INTERTROCHANTERIC INTRAMEDULLARY (IM) NAIL;  Surgeon: Leandrew Koyanagi, MD;  Location: [place];  Service: Orthopedics;  Laterality: Right;   Social History   Occupational History  . Not on file  Tobacco Use  . Smoking status: Former Smoker    Packs/day: 1.50    Years: 50.00    Pack years: 75.00    Types: Cigarettes    Quit date: 08/20/2011    Years since quitting: 8.6  . Smokeless tobacco: Never Used  Substance and Sexual Activity  . Alcohol use: Yes    Comment: 1beer a day  . Drug use: No  . Sexual activity: Not Currently

## 2020-04-08 NOTE — Addendum Note (Signed)
Addended by: Precious Bard on: 04/08/2020 03:54 PM   Modules accepted: Orders

## 2020-04-09 ENCOUNTER — Other Ambulatory Visit: Payer: Self-pay | Admitting: Physician Assistant

## 2020-04-09 ENCOUNTER — Telehealth: Payer: Self-pay | Admitting: *Deleted

## 2020-04-09 ENCOUNTER — Other Ambulatory Visit: Payer: Self-pay | Admitting: Oncology

## 2020-04-09 MED ORDER — OXYCODONE HCL 5 MG PO TABS: 5.0000 mg | ORAL_TABLET | Freq: Two times a day (BID) | ORAL | 0 refills | Status: DC | PRN

## 2020-04-09 MED ORDER — DIAZEPAM 2 MG PO TABS: ORAL_TABLET | ORAL | 0 refills | Status: DC

## 2020-04-09 NOTE — Telephone Encounter (Signed)
Pt is having MRI this Saturday and wants something to help ease him for claustrophobia.

## 2020-04-09 NOTE — Telephone Encounter (Signed)
Sent in valium

## 2020-04-10 NOTE — Telephone Encounter (Signed)
Called patient. He is aware.

## 2020-04-11 ENCOUNTER — Ambulatory Visit (HOSPITAL_COMMUNITY)
Admission: RE | Admit: 2020-04-11 | Discharge: 2020-04-11 | Disposition: A | Discharge status: Home / Self Care | Payer: Medicare Other | Source: Ambulatory Visit | Attending: Oncology | Admitting: Oncology

## 2020-04-11 ENCOUNTER — Encounter (HOSPITAL_COMMUNITY)
Admission: RE | Admit: 2020-04-11 | Discharge: 2020-04-11 | Disposition: A | Discharge status: Home / Self Care | Payer: Medicare Other | Source: Ambulatory Visit | Attending: Oncology | Admitting: Oncology

## 2020-04-11 ENCOUNTER — Inpatient Hospital Stay: Payer: Medicare Other | Attending: Oncology

## 2020-04-11 ENCOUNTER — Other Ambulatory Visit: Payer: Self-pay

## 2020-04-11 DIAGNOSIS — J439 Emphysema, unspecified: Secondary | ICD-10-CM | POA: Insufficient documentation

## 2020-04-11 DIAGNOSIS — Z7901 Long term (current) use of anticoagulants: Secondary | ICD-10-CM | POA: Diagnosis not present

## 2020-04-11 DIAGNOSIS — C61 Malignant neoplasm of prostate: Secondary | ICD-10-CM

## 2020-04-11 DIAGNOSIS — R609 Edema, unspecified: Secondary | ICD-10-CM | POA: Insufficient documentation

## 2020-04-11 DIAGNOSIS — Z5111 Encounter for antineoplastic chemotherapy: Secondary | ICD-10-CM | POA: Diagnosis not present

## 2020-04-11 DIAGNOSIS — Z923 Personal history of irradiation: Secondary | ICD-10-CM | POA: Diagnosis not present

## 2020-04-11 DIAGNOSIS — I700 Atherosclerosis of aorta: Secondary | ICD-10-CM | POA: Diagnosis not present

## 2020-04-11 DIAGNOSIS — M898X9 Other specified disorders of bone, unspecified site: Secondary | ICD-10-CM | POA: Diagnosis not present

## 2020-04-11 DIAGNOSIS — C787 Secondary malignant neoplasm of liver and intrahepatic bile duct: Secondary | ICD-10-CM | POA: Insufficient documentation

## 2020-04-11 DIAGNOSIS — M79606 Pain in leg, unspecified: Secondary | ICD-10-CM | POA: Insufficient documentation

## 2020-04-11 DIAGNOSIS — Z79899 Other long term (current) drug therapy: Secondary | ICD-10-CM | POA: Insufficient documentation

## 2020-04-11 DIAGNOSIS — C7951 Secondary malignant neoplasm of bone: Secondary | ICD-10-CM | POA: Insufficient documentation

## 2020-04-11 DIAGNOSIS — I2510 Atherosclerotic heart disease of native coronary artery without angina pectoris: Secondary | ICD-10-CM | POA: Insufficient documentation

## 2020-04-11 LAB — CBC WITH DIFFERENTIAL (CANCER CENTER ONLY)
Abs Immature Granulocytes: 0.04 K/uL (ref 0.00–0.07)
Basophils Absolute: 0 K/uL (ref 0.0–0.1)
Basophils Relative: 0 %
Eosinophils Absolute: 0.1 K/uL (ref 0.0–0.5)
Eosinophils Relative: 2 %
HCT: 37.4 % — ABNORMAL LOW (ref 39.0–52.0)
Hemoglobin: 12.2 g/dL — ABNORMAL LOW (ref 13.0–17.0)
Immature Granulocytes: 1 %
Lymphocytes Relative: 11 %
Lymphs Abs: 0.9 K/uL (ref 0.7–4.0)
MCH: 33.3 pg (ref 26.0–34.0)
MCHC: 32.6 g/dL (ref 30.0–36.0)
MCV: 102.2 fL — ABNORMAL HIGH (ref 80.0–100.0)
Monocytes Absolute: 0.8 K/uL (ref 0.1–1.0)
Monocytes Relative: 10 %
Neutro Abs: 6.3 K/uL (ref 1.7–7.7)
Neutrophils Relative %: 76 %
Platelet Count: 462 K/uL — ABNORMAL HIGH (ref 150–400)
RBC: 3.66 MIL/uL — ABNORMAL LOW (ref 4.22–5.81)
RDW: 13.7 % (ref 11.5–15.5)
WBC Count: 8.2 K/uL (ref 4.0–10.5)
nRBC: 0 % (ref 0.0–0.2)

## 2020-04-11 LAB — CMP (CANCER CENTER ONLY)
ALT: 33 U/L (ref 0–44)
AST: 63 U/L — ABNORMAL HIGH (ref 15–41)
Albumin: 3.5 g/dL (ref 3.5–5.0)
Alkaline Phosphatase: 358 U/L — ABNORMAL HIGH (ref 38–126)
Anion gap: 14 (ref 5–15)
BUN: 18 mg/dL (ref 8–23)
CO2: 25 mmol/L (ref 22–32)
Calcium: 9.8 mg/dL (ref 8.9–10.3)
Chloride: 99 mmol/L (ref 98–111)
Creatinine: 1.16 mg/dL (ref 0.61–1.24)
GFR, Est AFR Am: >60 mL/min (ref >60)
GFR, Estimated: >60 mL/min (ref >60)
Glucose, Bld: 110 mg/dL — ABNORMAL HIGH (ref 70–99)
Potassium: 5.3 mmol/L — ABNORMAL HIGH (ref 3.5–5.1)
Sodium: 138 mmol/L (ref 135–145)
Total Bilirubin: 0.7 mg/dL (ref 0.3–1.2)
Total Protein: 7.7 g/dL (ref 6.5–8.1)

## 2020-04-11 MED ORDER — SODIUM CHLORIDE (PF) 0.9 % IJ SOLN
INTRAMUSCULAR | Status: AC
  Filled 2020-04-11: qty 50

## 2020-04-11 MED ORDER — IOHEXOL 300 MG/ML  SOLN
100.0000 mL | Freq: Once | INTRAMUSCULAR | Status: AC | PRN
  Administered 2020-04-11: 09:00:00 100 mL via INTRAVENOUS

## 2020-04-11 MED ORDER — TECHNETIUM TC 99M MEDRONATE IV KIT
22.0000 | PACK | Freq: Once | INTRAVENOUS | Status: AC
  Administered 2020-04-11: 22 via INTRAVENOUS

## 2020-04-13 ENCOUNTER — Other Ambulatory Visit: Payer: Self-pay

## 2020-04-13 ENCOUNTER — Ambulatory Visit (HOSPITAL_BASED_OUTPATIENT_CLINIC_OR_DEPARTMENT_OTHER)
Admission: RE | Admit: 2020-04-13 | Discharge: 2020-04-13 | Disposition: A | Discharge status: Home / Self Care | Payer: Medicare Other | Source: Ambulatory Visit | Attending: Orthopaedic Surgery | Admitting: Orthopaedic Surgery

## 2020-04-13 DIAGNOSIS — M84451A Pathological fracture, right femur, initial encounter for fracture: Secondary | ICD-10-CM | POA: Diagnosis not present

## 2020-04-13 DIAGNOSIS — M25562 Pain in left knee: Secondary | ICD-10-CM | POA: Diagnosis present

## 2020-04-13 DIAGNOSIS — G8929 Other chronic pain: Secondary | ICD-10-CM | POA: Diagnosis present

## 2020-04-13 MED ORDER — GADOBUTROL 1 MMOL/ML IV SOLN
6.0000 mL | Freq: Once | INTRAVENOUS | Status: AC | PRN
  Administered 2020-04-13: 6 mL via INTRAVENOUS

## 2020-04-15 ENCOUNTER — Encounter: Payer: Self-pay | Admitting: Orthopaedic Surgery

## 2020-04-15 ENCOUNTER — Other Ambulatory Visit: Payer: Self-pay

## 2020-04-15 ENCOUNTER — Ambulatory Visit: Payer: Medicare Other | Admitting: Orthopaedic Surgery

## 2020-04-15 VITALS — Ht 68.0 in | Wt 153.0 lb

## 2020-04-15 DIAGNOSIS — C7951 Secondary malignant neoplasm of bone: Secondary | ICD-10-CM

## 2020-04-15 DIAGNOSIS — C61 Malignant neoplasm of prostate: Secondary | ICD-10-CM | POA: Diagnosis not present

## 2020-04-15 NOTE — Progress Notes (Signed)
Office Visit Note   Patient: Anthony Escobar           Date of Birth: Dec 27, 1951           MRN: [id number] Visit Date: 04/15/2020              Requested by: [name], MD [place],  [place] [address] PCP: [name], MD   Assessment & Plan: Visit Diagnoses:  1. Prostate cancer metastatic to bone [facility])     Plan: MRI of the tibia does show an aggressive osseous lesion eroding the anterior cortex of the proximal tibia likely secondary to metastatic prostate cancer.  These findings were reviewed with the patient in detail and given the findings I have recommended referral to Dr. Magda Bernheim or Dr. Janice Coffin for further evaluation and treatment.  I have instructed him to use crutches for ambulation and to limit his weightbearing to minimize risk of pathologic fracture.  Questions encouraged and answered.  Follow-Up Instructions: Return if symptoms worsen or fail to improve.   Orders:  No orders of the defined types were placed in this encounter.  No orders of the defined types were placed in this encounter.     Procedures: No procedures performed   Clinical Data: No additional findings.   Subjective: Chief Complaint  Patient presents with  . Left Leg - Follow-up    MRI results    Anthony Escobar returns today for MRI review.  He is accompanied by his granddaughter.   Review of Systems   Objective: Vital Signs: Ht 5\' 8"  (1.727 m)   Wt 153 lb (69.4 kg)   BMI 23.26 kg/m   Physical Exam  Ortho Exam Exam is unchanged. Specialty Comments:  No specialty comments available.  Imaging: No results found.   PMFS History: Patient Active Problem List   Diagnosis Date Noted  . Prostate cancer metastatic to bone ([place]) 11/09/2019  . Hyperkalemia 10/19/2019  . Hyponatremia 10/19/2019  . Pathologic intertrochanteric fracture, right, initial encounter ([place]) 10/17/2019  . Acute bronchitis 12/15/2018  . Encounter for  antineoplastic chemotherapy 04/27/2018  . Prostate cancer ([place]) 01/19/2018  . Multiple pulmonary nodules 10/28/2017  . COPD (chronic obstructive pulmonary disease) ([place]) 10/18/2017  . Tobacco use 10/18/2017   Past Medical History:  Diagnosis Date  . Hyperlipidemia   . Hypertension   . Prostate cancer [facility])     Family History  Problem Relation Age of Onset  . Stroke Mother   . Cancer Father        esophageal  . Cancer Paternal Grandmother 57       unknown  . Prostate cancer Neg Hx   . Breast cancer Neg Hx   . Colon cancer Neg Hx     Past Surgical History:  Procedure Laterality Date  . HERNIA REPAIR  1988   bilateral inguinal  . INTRAMEDULLARY (IM) NAIL INTERTROCHANTERIC Right 10/22/2019   Procedure: RIGHT INTERTROCHANTERIC INTRAMEDULLARY (IM) NAIL;  Surgeon: Leandrew Koyanagi, MD;  Location: [place];  Service: Orthopedics;  Laterality: Right;   Social History   Occupational History  . Not on file  Tobacco Use  . Smoking status: Former Smoker    Packs/day: 1.50    Years: 50.00    Pack years: 75.00    Types: Cigarettes    Quit date: 08/20/2011    Years since quitting: 8.6  . Smokeless tobacco: Never Used  Substance and Sexual Activity  . Alcohol use:  Yes    Comment: 1beer a day  . Drug use: No  . Sexual activity: Not Currently

## 2020-04-16 ENCOUNTER — Other Ambulatory Visit: Payer: Self-pay | Admitting: Oncology

## 2020-04-16 ENCOUNTER — Inpatient Hospital Stay: Payer: Medicare Other | Admitting: Oncology

## 2020-04-16 ENCOUNTER — Telehealth: Payer: Self-pay

## 2020-04-16 ENCOUNTER — Other Ambulatory Visit: Payer: Self-pay

## 2020-04-16 ENCOUNTER — Inpatient Hospital Stay: Payer: Medicare Other

## 2020-04-16 VITALS — BP 121/68 | HR 66 | Temp 98.2°F | Resp 18 | Ht 68.0 in | Wt 151.1 lb

## 2020-04-16 DIAGNOSIS — C61 Malignant neoplasm of prostate: Secondary | ICD-10-CM | POA: Diagnosis not present

## 2020-04-16 DIAGNOSIS — Z5111 Encounter for antineoplastic chemotherapy: Secondary | ICD-10-CM | POA: Diagnosis not present

## 2020-04-16 MED ORDER — LEUPROLIDE ACETATE (4 MONTH) 30 MG ~~LOC~~ KIT
30.0000 mg | PACK | Freq: Once | SUBCUTANEOUS | Status: DC
  Filled 2020-04-16: qty 30

## 2020-04-16 MED ORDER — PROCHLORPERAZINE MALEATE 10 MG PO TABS: 10.0000 mg | ORAL_TABLET | Freq: Four times a day (QID) | ORAL | 0 refills | Status: AC | PRN

## 2020-04-16 MED ORDER — HYDROMORPHONE HCL 4 MG PO TABS: 4.0000 mg | ORAL_TABLET | ORAL | 0 refills | Status: DC | PRN

## 2020-04-16 NOTE — Progress Notes (Signed)
DISCONTINUE ON PATHWAY REGIMEN - Prostate   Docetaxel 75 mg/m2:   A cycle is every 21 days:     Docetaxel   **Always confirm dose/[facility] Bicalutamide:   A cycle is every 12 weeks:     Leuprolide acetate depot    Daily:     Bicalutamide   **Always confirm dose/schedule in your pharmacy ordering system**  REASON: Disease Progression PRIOR TREATMENT: POS77: Docetaxel 75 mg/m2 q21 Days Without Prednisone x 6 Cycles with Medical Castration TREATMENT RESPONSE: Partial Response (PR)  START ON PATHWAY REGIMEN - Prostate     A cycle is every 21 days.:     Cabazitaxel      Prednisone   **Always confirm dose/schedule in your pharmacy ordering system**  Patient Characteristics: Adenocarcinoma, Distant Metastases, Castration Resistant, Symptomatic, Prior Docetaxel/Docetaxel Ineligible Histology: Adenocarcinoma Therapeutic Status: Distant Metastases  Intent of Therapy: Non-Curative / Palliative Intent, Discussed with Patient

## 2020-04-16 NOTE — Progress Notes (Signed)
Hematology and Oncology Follow Up Visit  Anthony Escobar [id number] 30-Apr-1951 69 y.o. 04/16/2020 9:15 AM Anthony Escobar, MDBoyd, Anthony Factor, MD   Principle Diagnosis: 69 year old man with advanced prostate cancer initially diagnosed in 2019.  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    Prior Therapy:  He is status post prostate biopsy on December 29, 2017 showed a Gleason score 4+4 = 8 and 5 cores.   Taxotere chemotherapy with the first cycle of chemotherapy given on February 02, 2018.  He completed 6 cycles of therapy on May 18, 2018.  He is status post treatment of a intertrochanteric fracture and intramedullary implant completed on October 22, 2019 after sustaining a pathological fracture of the right hip.  He is status post right hip radiation for a total of 30 Gy in 10 fractions completed on November 27, 2019.  Current therapy:  Androgen deprivation therapy.  He received Eligard in December 2020 and will be repeated in April 2021.  Xtandi 160 mg daily started in March of 2020.  Interim History: Anthony Escobar is here for a follow-up visit.  Since the last visit, he reported increase in his left knee and leg pain.  Despite taking oxycodone without any improvement at this time.  He was evaluated by Dr. Erlinda Escobar and MRI of the tibia showed a large proximal lesion.  He is currently wearing a brace with limited ambulation.  His appetite has decreased although his weight remains reasonable.  He denies any worsening back pain or other bone pain.  Denies any falls or syncope.  Denies any shortness of breath or abdominal pain.  Denies any changes in bowel habits.  Medications: Unchanged on review.  Current Outpatient Medications  Medication Sig Dispense Refill  . albuterol (PROAIR HFA) 108 (90 Base) MCG/ACT inhaler Inhale 2 puffs into the lungs every 4 (four) hours as needed  for wheezing or shortness of breath. 1 Inhaler 5  . amitriptyline (ELAVIL) 75 MG tablet Take 75 mg by mouth at bedtime.    . cholecalciferol (VITAMIN D3) 25 MCG (1000 UT) tablet Take 1,000 Units by mouth daily.    . Coenzyme Q10 (COQ10 PO) Take 1 tablet by mouth daily.    . diazepam (VALIUM) 2 MG tablet Take one tab one hour prior to MRI and repeat once just prior to MRI if needed 2 tablet 0  . enzalutamide (XTANDI) 40 MG capsule Take 4 capsules (160 mg total) by mouth daily. 120 capsule 2  . lisinopril (ZESTRIL) 20 MG tablet Take 1 tablet (20 mg total) by mouth daily. 90 tablet 3  . Multiple Vitamin (MULTIVITAMIN) tablet Take 1 tablet by mouth daily.    Marland Kitchen oxyCODONE (OXY IR/ROXICODONE) 5 MG immediate release tablet Take 1-2 tablets (5-10 mg total) by mouth 2 (two) times daily as needed for severe pain. 60 tablet 0  . polyethylene glycol (MIRALAX / GLYCOLAX) 17 g packet Take 17 g by mouth daily. 14 each 0  . prochlorperazine (COMPAZINE) 10 MG tablet Take 1 tablet (10 mg total) by mouth every 6 (six) hours as needed for nausea or vomiting. 30 tablet 0  . rivaroxaban (XARELTO) 10 MG TABS tablet Take 1 tablet (10 mg total) by mouth daily. 30 tablet 0  . rosuvastatin (CRESTOR) 10 MG tablet Take 10 mg by mouth at bedtime.    . Tiotropium Bromide Monohydrate (SPIRIVA RESPIMAT) 2.5 MCG/ACT AERS Inhale 2  puffs into the lungs daily. 2 Inhaler 0  . TURMERIC PO Take 2 tablets by mouth daily.     No current facility-administered medications for this visit.     Allergies: No Known Allergies    Physical Exam:    Blood pressure 121/68, pulse 66, temperature 98.2 F (36.8 C), temperature source Temporal, resp. rate 18, height 5\' 8"  (1.727 m), weight 151 lb 1.6 oz (68.5 kg), SpO2 97 %.    ECOG: 0    General appearance: Comfortable appearing without any discomfort Head: Normocephalic without any trauma Oropharynx: Mucous membranes are moist and pink without any thrush or ulcers. Eyes: Pupils are  equal and round reactive to light. Lymph nodes: No cervical, supraclavicular, inguinal or axillary lymphadenopathy.   Heart:regular rate and rhythm.  S1 and S2 without leg edema. Lung: Clear without any rhonchi or wheezes.  No dullness to percussion. Abdomin: Soft, nontender, nondistended with good bowel sounds.  No hepatosplenomegaly. Musculoskeletal: No joint deformity or effusion.  Full range of motion noted. Neurological: Left knee brace in place.  Limited range of motion related to pain. Skin: No petechial rash or dryness.  Appeared moist.              Lab Results: Lab Results  Component Value Date   WBC 8.2 04/11/2020   HGB 12.2 (L) 04/11/2020   HCT 37.4 (L) 04/11/2020   MCV 102.2 (H) 04/11/2020   PLT 462 (H) 04/11/2020     Chemistry      Component Value Date/Time   NA 138 04/11/2020 0750   K 5.3 (H) 04/11/2020 0750   CL 99 04/11/2020 0750   CO2 25 04/11/2020 0750   BUN 18 04/11/2020 0750   CREATININE 1.16 04/11/2020 0750   CREATININE 0.88 05/23/2013 1422      Component Value Date/Time   CALCIUM 9.8 04/11/2020 0750   ALKPHOS 358 (H) 04/11/2020 0750   AST 63 (H) 04/11/2020 0750   ALT 33 04/11/2020 0750   BILITOT 0.7 04/11/2020 0750      IMPRESSION: 1. New widespread hepatic metastatic disease. 2. New/enlarging lower lobe pulmonary nodules, highly worrisome for metastatic disease. 3. Osseous metastatic disease has progressed. 4. Slight bladder wall thickening can be seen with an element of outlet obstruction. 5. Aortic atherosclerosis (ICD10-I70.0). Coronary artery calcification. 6.  Emphysema (ICD10-J43.9).  Results for Anthony, Escobar" (MRN [id number]) as of 04/16/2020 09:17  Ref. Range 12/18/2019 07:50 02/19/2020 08:08  Prostate Specific Ag, Serum Latest Ref Range: 0.0 - 4.0 ng/mL 4.7 (H) 11.2 (H)    1. 5.2 by 3.2 by 2.9 cm aggressive appearing mass of the left proximal tibia with suspected extraosseous extension of tumor anteriorly and  medially. This is highly likely to represent a metastatic lesion given the patient's history of prostate cancer. 2. The patellar tendon distal attachment is partly along the mass and partly along the rim of bone just above the mass, which may undermine the biomechanical resiliency of the distal attachment. 3. Suspected radial tears of the medial meniscus on the left side, and possible oblique tear of the posterior horn medial meniscus. However, the large field of view on today's exam leads one to reasonably expect a higher false positive rate than on a dedicated knee MRI. 4. Low-grade edema in the tibialis anterior muscle medially, likely reactive.  Impression and Plan:  69 year old man with:   1.    Advanced prostate cancer with disease to the bone as well as hepatic metastasis documented and April 2021.  He has castration-resistant after presenting with advanced disease in 2019.  The natural course of this disease was reviewed today.  CT scan obtained on 04/11/2020 was personally reviewed as well as laboratory data.  He has had a clear disease progression on his current therapy with hepatic and bone involvement.  Treatment options were discussed at this time I feel that systemic therapy is mandatory moving forward.  Jevtana chemotherapy was discussed today.  Appreciable nausea, vomiting, myelosuppression and neutropenia were reviewed.  After discussion he is agreeable to proceed at this time.    2.    Right hip fracture: Status post fixation and radiation therapy.    4.  Androgen depravation: He will receive Eligard today and repeated in 4 months.  Complications include weight gain, hot flashes and osteoporosis were reviewed.  5.  Prognosis: His disease is incurable and progressing rather rapidly.  His performance status is adequate and aggressive measures are warranted for the time being.  He understands that his disease has been very aggressive and if he does not respond to  chemotherapy has limited life expectancy.  6.  Bone directed therapy: Delton See has been deferred because of dental issues.  I recommended calcium and vitamin D supplements.  7.  Left tibial mass: He has been evaluated by orthopedics regarding this issue.  Options would include radical surgery versus palliative radiation therapy.  I will refer him to radiation oncology for palliative radiation.  8.  Bone pain: Prescription for Dilaudid will be available to him for stronger pain control.  9.  IV access: Risks and benefits of Port-A-Cath insertion was reviewed today.  Potential complications that include bleeding, thrombosis and infection were reiterated.  He is agreeable to proceed.  10.  Antiemetics: Prescription for Compazine will be available to him.  11.  Follow-up: Will be in the immediate future to start chemotherapy.   30 minutes were spent on this encounter.  Time was spent on reviewing imaging studies, laboratory data, discussing treatment options and future plan of care.Zola Button, MD 4/21/20219:15 AM

## 2020-04-16 NOTE — Telephone Encounter (Signed)
-----   Message from Amedeo Kinsman, [place] sent at 04/16/2020 10:02 AM EDT ----- Thank you.  CSW team will reach out.    Abigail  ----- Message ----- From: Tami Lin, RN Sent: 04/16/2020   9:56 AM EDT To: Amedeo Kinsman, LCSW, #  Good morning, Dr. Alen Blew wants to know if someone can reach out to the patient. Dr. Alen Blew said patient has questions about meal delivery services.  Thanks, Wendall Mola, RN

## 2020-04-16 NOTE — Patient Instructions (Signed)

## 2020-04-16 NOTE — Telephone Encounter (Signed)
See encounter below.

## 2020-04-17 ENCOUNTER — Telehealth: Payer: Self-pay | Admitting: *Deleted

## 2020-04-17 ENCOUNTER — Other Ambulatory Visit: Payer: Self-pay

## 2020-04-17 ENCOUNTER — Telehealth: Payer: Self-pay | Admitting: Oncology

## 2020-04-17 ENCOUNTER — Inpatient Hospital Stay: Payer: Medicare Other

## 2020-04-17 VITALS — BP 130/74 | HR 93 | Temp 97.8°F | Resp 20

## 2020-04-17 DIAGNOSIS — C61 Malignant neoplasm of prostate: Secondary | ICD-10-CM

## 2020-04-17 DIAGNOSIS — Z5111 Encounter for antineoplastic chemotherapy: Secondary | ICD-10-CM | POA: Diagnosis not present

## 2020-04-17 MED ORDER — LEUPROLIDE ACETATE (4 MONTH) 30 MG ~~LOC~~ KIT
30.0000 mg | PACK | Freq: Once | SUBCUTANEOUS | Status: AC
  Administered 2020-04-17: 13:00:00 30 mg via SUBCUTANEOUS
  Filled 2020-04-17: qty 30

## 2020-04-17 NOTE — Telephone Encounter (Signed)
Scheduled appt per 4/21 los. Spoke with pt and he is aware and stated he will check his mychart

## 2020-04-17 NOTE — Patient Instructions (Signed)

## 2020-04-17 NOTE — Telephone Encounter (Signed)
CSW received request from RN, patient's daughter requesting information regarding food delivery resources.  CSW attempted to call daughter, [name], but unable to reach her. CSW left voicemail to return call.  Maryjean Morn, MSW, LCSW, OSW-C Clinical Social Worker [facility] [phone]

## 2020-04-18 ENCOUNTER — Ambulatory Visit
Admission: RE | Admit: 2020-04-18 | Discharge: 2020-04-18 | Disposition: A | Discharge status: Home / Self Care | Payer: Medicare Other | Source: Ambulatory Visit | Attending: Radiation Oncology | Admitting: Radiation Oncology

## 2020-04-18 ENCOUNTER — Ambulatory Visit
Admission: RE | Admit: 2020-04-18 | Discharge: 2020-04-18 | Disposition: A | Discharge status: Home / Self Care | Payer: Medicare Other | Source: Ambulatory Visit | Attending: Urology | Admitting: Urology

## 2020-04-18 ENCOUNTER — Encounter: Payer: Self-pay | Admitting: Urology

## 2020-04-18 DIAGNOSIS — C61 Malignant neoplasm of prostate: Secondary | ICD-10-CM

## 2020-04-18 DIAGNOSIS — C7951 Secondary malignant neoplasm of bone: Secondary | ICD-10-CM

## 2020-04-18 NOTE — Progress Notes (Signed)
.  The following biosimilar Anthony Escobar has been selected for use in this patient as insurance preferred brand.   Henreitta Leber, PharmD 04/18/20

## 2020-04-18 NOTE — Progress Notes (Signed)
Radiation Oncology         [phone]) [phone] ________________________________  Name: Anthony Escobar MRN: [id number]  Date: 04/18/2020  DOB: Apr 08, 1951  Re-Consultation  CC: Anthony Bill, MD  Anthony Portela, MD  Diagnosis:   69 y.o. gentleman with castrate-resistant prostate cancer with osseous metastases presenting today with a new, painful metastasis in the proximal left tibia.  Interval Since Last Radiation:  4.5 months; post op XRT 11/13/19-11/27/19:  The right hip was treated to 30 Gy in 10 fractions of 3 Gy s/p ORIF 10/22/19.   Narrative: The patient returns today for re-consultation at the request of Dr. Alen Escobar, to discuss treatment options for a new, painful osseous metastasis in the left proximal tibia.  In summary, he had a history of elevated PSA dating back to 2011. Biopsy in 03/2010 showed only atypia. While being worked up for shortness of breath in 09/2017, he was found to have advanced prostate cancer with metastatic disease to the lungs and bone and a PSA of 19.57. This was confirmed with prostate biopsy on 12/29/2017 by Dr. Karsten Escobar, which revealed Gleason 4+4 prostate cancer and 8 positive cores. He was started on ADT and referred to Dr. Alen Escobar. He subsequently received 6 cycles of taxotere therapy which he completed on 05/18/2018. He developed right hip pain in 09/2019. When evaluated on MRI, findings were suspicious for pathologic intertrochanteric fracture. He underwent emergent intertrochanteric nailing on 10/22/2019 under the care of Dr. Erlinda Escobar when his pain became severe and poorly controlled. We met with the patient on 11/06/2019, and he subsequently received 10 fractions of postoperative radiation therapy to the right hip- completed 11/27/2019.  He did note significant improvement in the right hip pain following completion of radiation and tolerated treatment very well.  He has continued under the care of Dr. Alen Escobar for management of his systemic disease with Eligard ADT q 4  months and Xtandi added in 02/2019 when his PSA had increased to 38.7 from 3.2 in 09/2018.  His PSA responded well to the combination therapy with a nadir at 0.6 in 05/2019 but rose to 12.8 in 09/2019. The PSA had decreased to 4.7 in 11/2019, following completion of the radiation but his most recent PSA has increased to 11.2 on 02/19/20.  More recently, he developed left knee and lower leg pain with a painful, swollen mass at the anterior aspect of the left proximal tibia. He was evaluated by Dr. Erlinda Escobar on 04/08/2020, and left knee x-ray revealed a interosseous, circular lesion in the proximal tibia. MRI Tib/Fib was performed on 04/13/2020 for further evaluation and confirmed a 5.2 cm aggressive-appearing mass of the left proximal tibia with suspected extraosseous extension of tumor anteriorly and medially.    Of note, his restaging CT A/P and bone scan on 04/11/2020 also showed progressive metastatic disease with new widespread hepatic metastatic disease, new/enlarging bilateral lower lobe pulmonary nodules as well as progression of osseous metastatic disease.  Dr. Erlinda Escobar had recommended referral to a surgical oncologist (Dr. Magda Escobar or Dr. Janice Escobar) for consideration of radical surgery versus palliative radiotherapy.  The patient met back with Dr. Alen Escobar and was not interested in proceeding with a radical surgery but instead preferred to consider the palliative radiation.  Therefore, he has kindly been referred back to our clinic today to discuss the potential role of palliative radiotherapy to the new, painful metastatic lesion in the left proximal tibia. The Gillermina Phy has been discontinued due to disease progression and he is scheduled for port placement  next week in anticipation of switching his systemic treatment to Jevtana chemotherapy to begin in the near future.  On review of systems, the patient states that he is doing well overall. His only complaint is of the persistent pain in the right hip which is  tolerable and the new, severe, acute pain in the left lower leg that has been poorly controlled up to this point.  His pain medications were changed from Oxycodone to Dilaudid recently and he has gotten tremendous relief with this.  He reports being able to sleep through the night a full 7-8 hours last night for the first time in many months.  He denies any chest pain, shortness of breath, cough, fevers, chills, night sweats, or recent unintended weight changes. He denies any bowel or bladder disturbances, and denies abdominal pain, nausea or vomiting. He denies any new skin lesions or concerns. A complete review of systems is obtained and is otherwise negative.   ALLERGIES:  has No Known Allergies.  Meds: Current Outpatient Medications  Medication Sig Dispense Refill  . amitriptyline (ELAVIL) 75 MG tablet Take 75 mg by mouth at bedtime.    . cholecalciferol (VITAMIN D3) 25 MCG (1000 UT) tablet Take 1,000 Units by mouth daily.    . Coenzyme Q10 (COQ10 PO) Take 1 tablet by mouth daily.    . diazepam (VALIUM) 2 MG tablet Take one tab one hour prior to MRI and repeat once just prior to MRI if needed 2 tablet 0  . enzalutamide (XTANDI) 40 MG capsule Take 4 capsules (160 mg total) by mouth daily. 120 capsule 2  . HYDROmorphone (DILAUDID) 4 MG tablet Take 1 tablet (4 mg total) by mouth every 4 (four) hours as needed for severe pain. 30 tablet 0  . lisinopril (ZESTRIL) 20 MG tablet Take 1 tablet (20 mg total) by mouth daily. 90 tablet 3  . Multiple Vitamin (MULTIVITAMIN) tablet Take 1 tablet by mouth daily.    Marland Kitchen oxyCODONE (OXY IR/ROXICODONE) 5 MG immediate release tablet Take 1-2 tablets (5-10 mg total) by mouth 2 (two) times daily as needed for severe pain. 60 tablet 0  . polyethylene glycol (MIRALAX / GLYCOLAX) 17 g packet Take 17 g by mouth daily. 14 each 0  . prochlorperazine (COMPAZINE) 10 MG tablet Take 1 tablet (10 mg total) by mouth every 6 (six) hours as needed for nausea or vomiting. 30 tablet 0   . Tiotropium Bromide Monohydrate (SPIRIVA RESPIMAT) 2.5 MCG/ACT AERS Inhale 2 puffs into the lungs daily. 2 Inhaler 0  . TURMERIC PO Take 2 tablets by mouth daily.    Marland Kitchen albuterol (PROAIR HFA) 108 (90 Base) MCG/ACT inhaler Inhale 2 puffs into the lungs every 4 (four) hours as needed for wheezing or shortness of breath. (Patient not taking: Reported on 04/18/2020) 1 Inhaler 5  . rivaroxaban (XARELTO) 10 MG TABS tablet Take 1 tablet (10 mg total) by mouth daily. (Patient not taking: Reported on 04/18/2020) 30 tablet 0  . rosuvastatin (CRESTOR) 10 MG tablet Take 10 mg by mouth at bedtime.     No current facility-administered medications for this encounter.    Physical Findings:  vitals were not taken for this visit.   /Unable to assess due to telephone follow up visit format.   Lab Findings: Lab Results  Component Value Date   WBC 8.2 04/11/2020   HGB 12.2 (L) 04/11/2020   HCT 37.4 (L) 04/11/2020   MCV 102.2 (H) 04/11/2020   PLT 462 (H) 04/11/2020  Radiographic Findings: NM Bone Scan Whole Body  Result Date: 04/12/2020 CLINICAL DATA:  Prostate cancer.  Staging. EXAM: NUCLEAR MEDICINE WHOLE BODY BONE SCAN TECHNIQUE: Whole body anterior and posterior images were obtained approximately 3 hours after intravenous injection of radiopharmaceutical. RADIOPHARMACEUTICALS:  22 mCi Technetium-65mMDP IV COMPARISON:  Bone scan 07/04/2018. Abdomen pelvis CT performed same day. Right hip MRI 10/16/2019 also reviewed FINDINGS: Multifocal uptake throughout the skeleton consistent with metastatic disease. Skeletal findings have progressed from 2019 bone scan. Small hypermetabolic foci in the left and possibly right calvarium. Multiple foci of bilateral rib uptake, new from prior exam. Previous rib uptake in the left anterior lower ribs has improved. There is uptake involving the lumbar spine at multiple levels. Foci of uptake involving the thorax centrally may represent posterior ribs or thoracic vertebra.  Multiple foci of uptake throughout the bony pelvis. Uptake in the right femur involves the greater trochanter, patient is post right femoral pinning. Uptake in the left proximal femur appears centered on the greater trochanter. There is also activity involving the left acetabulum. Intense radiotracer activity in the left proximal tibia corresponds to a lytic lesion on recent knee radiograph. Uptake involving both shoulders may be degenerative, however there is uptake involving the right inferior scapula that is likely metastatic. Both kidneys and bladder are visualized. IMPRESSION: 1. Multifocal uptake consistent with metastatic disease. Overall bony metastatic disease has progressed from 2019 comparison. There are new lesions throughout the thoracic and lumbar spine, bilateral ribs, left and possibly right calvarium, pelvis and left proximal femur. 2. Uptake in the left proximal tibia corresponds to a lytic lesion on knee recent radiograph. Electronically Signed   By: MKeith RakeM.D.   On: 04/12/2020 15:51   CT Abdomen Pelvis W Contrast  Result Date: 04/11/2020 CLINICAL DATA:  Prostate cancer staging. Chemotherapy completed 2019. Ongoing Lupron injections. EXAM: CT ABDOMEN AND PELVIS WITH CONTRAST TECHNIQUE: Multidetector CT imaging of the abdomen and pelvis was performed using the standard protocol following bolus administration of intravenous contrast. CONTRAST:  1068mOMNIPAQUE IOHEXOL 300 MG/ML  SOLN COMPARISON:  10/24/2019 and CT chest 02/28/2019. FINDINGS: Lower chest: Tiny right lower lobe nodules measure up to 4 mm (4/2). These were not imaged on 10/24/2019 and appear new from 02/28/2019. Centrilobular emphysema. Peripheral left lower lobe nodules measure up to 7 mm in the medial left lower lobe (4/34), increased from 3 mm on 02/28/2019. Atherosclerotic calcification of the aorta and coronary arteries. Heart size normal. No pericardial or pleural effusion. Hepatobiliary: New low-attenuation  nodules and masses are seen throughout the liver. Conglomerate heterogeneous mass in segment 4 measures 9.0 x 12.0 cm (2/24). Gallbladder is unremarkable. No biliary ductal dilatation. Pancreas: Negative. Spleen: Negative. Adrenals/Urinary Tract: Adrenal glands are unremarkable. Low-attenuation lesions in the kidneys measure up to 1.5 cm on the left and are likely cysts. Kidneys are otherwise unremarkable. Ureters are decompressed. Slight bladder wall thickening. Stomach/Bowel: Stomach is decompressed. Small bowel, appendix and colon are unremarkable. Vascular/Lymphatic: Atherosclerotic calcification of the aorta without aneurysm. No pathologically enlarged lymph nodes. Reproductive: Prostate is visualized. Other: No free fluid.  Mesenteries and peritoneum are unremarkable. Musculoskeletal: New mixed lysis and sclerosis in the left acetabulum. Postoperative changes in the proximal right femur seen with extensive mixed lysis and sclerosis. Lytic lesion in the posterior aspect of the proximal right femur has enlarged, now measuring 2.6 cm (2/87), compared to 1.5 cm on 10/24/2019. There is a lytic lesion in the posterior aspect of L3, new. Slight compression of the superior  endplates of L1 and L2, as before. New compression and vague sclerosis involving the T11 vertebral body. IMPRESSION: 1. New widespread hepatic metastatic disease. 2. New/enlarging lower lobe pulmonary nodules, highly worrisome for metastatic disease. 3. Osseous metastatic disease has progressed. 4. Slight bladder wall thickening can be seen with an element of outlet obstruction. 5. Aortic atherosclerosis (ICD10-I70.0). Coronary artery calcification. 6.  Emphysema (ICD10-J43.9). Electronically Signed   By: Lorin Picket M.D.   On: 04/11/2020 14:10   MR TIBIA FIBULA LEFT W WO CONTRAST  Result Date: 04/13/2020 CLINICAL DATA:  Lower leg pain, sudden onset of mass distal to the patella for 1 month. Painful with bending of the knee. EXAM: MRI OF  LOWER LEFT EXTREMITY WITHOUT AND WITH CONTRAST TECHNIQUE: Multiplanar, multisequence MR imaging of the left tibia/fibula was performed both before and after administration of intravenous contrast. CONTRAST:  37m GADAVIST GADOBUTROL 1 MMOL/ML IV SOLN COMPARISON:  Radiographs of 04/08/2020 and bone scan of 04/11/2020 FINDINGS: Bones/Joint/Cartilage 5.2 by 3.2 by 2.9 cm aggressive appearing mass of the left proximal tibia with surrounding marrow edema, low T1 and accentuated T2 signal, and moderate enhancement. There is cortical destruction anteriorly along the greater tuberosity, and the patellar tendon appears to partially attached to this mass and partially attached to the rim of bone just above this mass on image 23/9, which may undermined the biomechanical resiliency of the distal attachment site. In addition to bony expansion, there is likely some localized extra osseous extension of tumor anteriorly and medially for example on image 20/10. This lesion corresponds to the accentuated activity in the left proximal tibia shown on recent bone scan. In the context of the patient's prostate cancer this is highly likely to be a metastatic lesion. There geodes along the right and left tibial spine. No other regional metastatic lesions are identified. Today's exam uses large regions of interest and does not well suited to assessing the menisci. That said, there are some findings suspicious for radial tear of the midbody medial meniscus on the left, and possible oblique tear of the posterior horn medial meniscus on the right. False positive rate is significantly higher than on a dedicated knee MRI. Ligaments N/A Muscles and Tendons Low-grade edema in the tibialis anterior muscle medially is likely reactive. Soft tissues Likely extraosseous extension of tumor is noted above, otherwise unremarkable. IMPRESSION: 1. 5.2 by 3.2 by 2.9 cm aggressive appearing mass of the left proximal tibia with suspected extraosseous extension  of tumor anteriorly and medially. This is highly likely to represent a metastatic lesion given the patient's history of prostate cancer. 2. The patellar tendon distal attachment is partly along the mass and partly along the rim of bone just above the mass, which may undermine the biomechanical resiliency of the distal attachment. 3. Suspected radial tears of the medial meniscus on the left side, and possible oblique tear of the posterior horn medial meniscus. However, the large field of view on today's exam leads one to reasonably expect a higher false positive rate than on a dedicated knee MRI. 4. Low-grade edema in the tibialis anterior muscle medially, likely reactive. Electronically Signed   By: WVan ClinesM.D.   On: 04/13/2020 10:26   XR HIP UNILAT W OR W/O PELVIS 2-3 VIEWS RIGHT  Result Date: 04/08/2020 Stable fixation of intertrochanteric pathologic fracture  XR KNEE 3 VIEW LEFT  Result Date: 04/08/2020 There is an intraosseous circular lesion in the proximal tibia   Impression/Plan: 1. 69y.o. gentleman with castrate-resistant prostate cancer with  osseous metastases presenting today with a new, painful metastasis in the proximal left tibia. Today, we talked to the patient about the findings and workup thus far. We reviewed the natural history of castrate resistant metastatic prostate cancer and general treatment, highlighting the role of palliative radiotherapy in the management of painful bony metastases. We discussed the available radiation techniques, and focused on the details and logistics of delivery.  The recommendation is to proceed with a 2-week course of daily palliative radiotherapy to the painful metastatic lesion in the left proximal tibia.  We reviewed the anticipated acute and late sequelae associated with radiation in this setting. The patient was encouraged to ask questions that were answered to his stated satisfaction.  At the end of the conversation, the patient is  interested in moving forward with a 2 week course of palliative radiation therapy as recommended. He appears to have a good understanding of his disease and our treatment recommendations which are of palliative intent.  He is comfortable and in agreement with the stated plan.  We will share our discussion with Dr. Alen Escobar and Dr. Erlinda Escobar and proceed with treatment accordingly.  He is tentatively scheduled for CT simulation/treatment planning at 10 AM on Wednesday, 04/23/2020 in anticipation of beginning his daily radiation treatments on Thursday 04/24/2020.    [name], PA-C    Tyler Pita, MD  [facility] Direct Dial: [phone]  Fax: [phone] [email or web address]  Skype  [email or web address]   This document serves as a record of services personally performed by Tyler Pita, MD and Freeman Caldron, PA-C. It was created on their behalf by Wilburn Mylar, a trained medical scribe. The creation of this record is based on the scribe's personal observations and the provider's statements to them. This document has been checked and approved by the attending provider.

## 2020-04-18 NOTE — Progress Notes (Signed)
Pharmacist Chemotherapy Monitoring - Initial Assessment    Anticipated start date: 04/24/20  Regimen:  . Are orders appropriate based on the patient's diagnosis, regimen, and cycle? Yes . Does the plan date match the patient's scheduled date? Yes . Is the sequencing of drugs appropriate? Yes . Are the premedications appropriate for the patient's regimen? Yes . Prior Authorization for treatment is: Pending o If applicable, is the correct biosimilar selected based on the patient's insurance? not applicable  Organ Function and Labs: Marland Kitchen Are dose adjustments needed based on the patient's renal function, hepatic function, or hematologic function? No . Are appropriate labs ordered prior to the start of patient's treatment? Yes . Other organ system assessment, if indicated: N/A . The following baseline labs, if indicated, have been ordered: N/A  Dose Assessment: . Are the drug doses appropriate? Yes . Are the following correct: o Drug concentrations Yes o IV fluid compatible with drug Yes o Administration routes Yes o Timing of therapy Yes . If applicable, does the patient have documented access for treatment and/or plans for port-a-cath placement? yes . If applicable, have lifetime cumulative doses been properly documented and assessed? not applicable Lifetime Dose Tracking  No doses have been documented on this patient for the following tracked chemicals: Doxorubicin, Epirubicin, Idarubicin, Daunorubicin, Mitoxantrone, Bleomycin, Oxaliplatin, Carboplatin, Liposomal Doxorubicin  o   Toxicity Monitoring/Prevention: . The patient has the following take home antiemetics prescribed: Prochlorperazine . The patient has the following take home medications prescribed: N/A . Medication allergies and previous infusion related reactions, if applicable, have been reviewed and addressed. Yes . The patient's current medication list has been assessed for drug-drug interactions with their chemotherapy  regimen. no significant drug-drug interactions were identified on review.  Order Review: . Are the treatment plan orders signed? Yes . Is the patient scheduled to see a provider prior to their treatment? Yes  I verify that I have reviewed each item in the above checklist and answered each question accordingly.  Adelina Mings 04/18/2020 2:27 PM

## 2020-04-22 ENCOUNTER — Other Ambulatory Visit: Payer: Self-pay | Admitting: Physician Assistant

## 2020-04-22 ENCOUNTER — Telehealth: Payer: Self-pay | Admitting: Medical Oncology

## 2020-04-23 ENCOUNTER — Ambulatory Visit (HOSPITAL_COMMUNITY)
Admission: RE | Admit: 2020-04-23 | Discharge: 2020-04-23 | Disposition: A | Discharge status: Home / Self Care | Payer: Medicare Other | Source: Ambulatory Visit | Attending: Oncology | Admitting: Oncology

## 2020-04-23 ENCOUNTER — Encounter: Payer: Self-pay | Admitting: Urology

## 2020-04-23 ENCOUNTER — Other Ambulatory Visit: Payer: Self-pay

## 2020-04-23 ENCOUNTER — Other Ambulatory Visit: Payer: Self-pay | Admitting: Oncology

## 2020-04-23 ENCOUNTER — Encounter: Payer: Self-pay | Admitting: *Deleted

## 2020-04-23 ENCOUNTER — Encounter (HOSPITAL_COMMUNITY): Payer: Self-pay

## 2020-04-23 ENCOUNTER — Ambulatory Visit
Admission: RE | Admit: 2020-04-23 | Discharge: 2020-04-23 | Disposition: A | Discharge status: Home / Self Care | Payer: Medicare Other | Source: Ambulatory Visit | Attending: Radiation Oncology | Admitting: Radiation Oncology

## 2020-04-23 DIAGNOSIS — C7951 Secondary malignant neoplasm of bone: Secondary | ICD-10-CM

## 2020-04-23 DIAGNOSIS — Z510 Encounter for antineoplastic radiation therapy: Secondary | ICD-10-CM | POA: Insufficient documentation

## 2020-04-23 DIAGNOSIS — Z79899 Other long term (current) drug therapy: Secondary | ICD-10-CM | POA: Diagnosis not present

## 2020-04-23 DIAGNOSIS — C61 Malignant neoplasm of prostate: Secondary | ICD-10-CM | POA: Diagnosis not present

## 2020-04-23 DIAGNOSIS — R0609 Other forms of dyspnea: Secondary | ICD-10-CM | POA: Insufficient documentation

## 2020-04-23 DIAGNOSIS — E785 Hyperlipidemia, unspecified: Secondary | ICD-10-CM | POA: Insufficient documentation

## 2020-04-23 DIAGNOSIS — I10 Essential (primary) hypertension: Secondary | ICD-10-CM | POA: Diagnosis not present

## 2020-04-23 DIAGNOSIS — Z7901 Long term (current) use of anticoagulants: Secondary | ICD-10-CM | POA: Diagnosis not present

## 2020-04-23 DIAGNOSIS — Z87891 Personal history of nicotine dependence: Secondary | ICD-10-CM | POA: Diagnosis not present

## 2020-04-23 DIAGNOSIS — M84451A Pathological fracture, right femur, initial encounter for fracture: Secondary | ICD-10-CM

## 2020-04-23 HISTORY — DX: Dyspnea, unspecified: R06.00

## 2020-04-23 HISTORY — PX: IR IMAGING GUIDED PORT INSERTION: IMG5740

## 2020-04-23 HISTORY — DX: Malignant neoplasm of unspecified part of unspecified bronchus or lung: C34.90

## 2020-04-23 MED ORDER — HEPARIN SOD (PORK) LOCK FLUSH 100 UNIT/ML IV SOLN
INTRAVENOUS | Status: AC | PRN
  Administered 2020-04-23: 500 [IU] via INTRAVENOUS

## 2020-04-23 MED ORDER — CEFAZOLIN SODIUM-DEXTROSE 2-4 GM/100ML-% IV SOLN
2.0000 g | Freq: Once | INTRAVENOUS | Status: AC
  Administered 2020-04-23: 2 g via INTRAVENOUS

## 2020-04-23 MED ORDER — MIDAZOLAM HCL 2 MG/2ML IJ SOLN
INTRAMUSCULAR | Status: AC
  Filled 2020-04-23: qty 4

## 2020-04-23 MED ORDER — HEPARIN SOD (PORK) LOCK FLUSH 100 UNIT/ML IV SOLN
INTRAVENOUS | Status: AC
  Filled 2020-04-23: qty 5

## 2020-04-23 MED ORDER — SODIUM CHLORIDE 0.9 % IV SOLN: INTRAVENOUS | Status: DC

## 2020-04-23 MED ORDER — CEFAZOLIN SODIUM-DEXTROSE 2-4 GM/100ML-% IV SOLN
INTRAVENOUS | Status: AC
  Filled 2020-04-23: qty 100

## 2020-04-23 MED ORDER — FENTANYL CITRATE (PF) 100 MCG/2ML IJ SOLN
INTRAMUSCULAR | Status: AC | PRN
  Administered 2020-04-23: 25 ug via INTRAVENOUS
  Administered 2020-04-23: 50 ug via INTRAVENOUS

## 2020-04-23 MED ORDER — LIDOCAINE-EPINEPHRINE 1 %-1:100000 IJ SOLN
INTRAMUSCULAR | Status: AC
  Filled 2020-04-23: qty 1

## 2020-04-23 MED ORDER — MIDAZOLAM HCL 2 MG/2ML IJ SOLN
INTRAMUSCULAR | Status: AC | PRN
  Administered 2020-04-23: 1 mg via INTRAVENOUS
  Administered 2020-04-23: 0.5 mg via INTRAVENOUS

## 2020-04-23 MED ORDER — FENTANYL CITRATE (PF) 100 MCG/2ML IJ SOLN
INTRAMUSCULAR | Status: AC
  Filled 2020-04-23: qty 2

## 2020-04-23 MED ORDER — LIDOCAINE-EPINEPHRINE 1 %-1:100000 IJ SOLN
INTRAMUSCULAR | Status: AC | PRN
  Administered 2020-04-23 (×2): 10 mL via INTRADERMAL

## 2020-04-23 NOTE — Consult Note (Signed)
Chief Complaint: Patient was seen in consultation today for Port-A-Cath placement  Referring Physician(s): [name]  Supervising Physician: Jacqulynn Cadet  Patient Status: [place]  History of Present Illness: Anthony Escobar is a 69 y.o. male smoker with history of advanced prostate cancer initially diagnosed in 2019 and now with disease progression to the bone and liver who presents today for Port-A-Cath placement for chemotherapy.  Past Medical History:  Diagnosis Date   Cancer of lung ([place])    Dyspnea    Hyperlipidemia    Hypertension    Prostate cancer [facility])     Past Surgical History:  Procedure Laterality Date   HERNIA REPAIR  1988   bilateral inguinal   INTRAMEDULLARY (IM) NAIL INTERTROCHANTERIC Right 10/22/2019   Procedure: RIGHT INTERTROCHANTERIC INTRAMEDULLARY (IM) NAIL;  Surgeon: Leandrew Koyanagi, MD;  Location: [place];  Service: Orthopedics;  Laterality: Right;    Allergies: Patient has no known allergies.  Medications: Prior to Admission medications   Medication Sig Start Date End Date Taking? Authorizing Provider  amitriptyline (ELAVIL) 75 MG tablet Take 75 mg by mouth at bedtime.   Yes [provider]  cholecalciferol (VITAMIN D3) 25 MCG (1000 UT) tablet Take 1,000 Units by mouth daily.   Yes [provider]  ibuprofen (ADVIL) 200 MG tablet Take 400 mg by mouth every 6 (six) hours as needed for moderate pain.   Yes [provider]  albuterol (PROAIR HFA) 108 (90 Base) MCG/ACT inhaler Inhale 2 puffs into the lungs every 4 (four) hours as needed for wheezing or shortness of breath. Patient not taking: Reported on 04/18/2020 03/14/18   Collene Gobble, MD  Coenzyme Q10 (COQ10 PO) Take 1 tablet by mouth daily.    [provider]  diazepam (VALIUM) 2 MG tablet Take one tab one hour prior to MRI and repeat once just prior to MRI if needed 04/09/20   Aundra Dubin, PA-C  enzalutamide Gillermina Phy) 40 MG capsule  Take 4 capsules (160 mg total) by mouth daily. 03/27/20   [name], MD  HYDROmorphone (DILAUDID) 4 MG tablet Take 1 tablet (4 mg total) by mouth every 4 (four) hours as needed for severe pain. 04/16/20   [name], MD  lisinopril (ZESTRIL) 20 MG tablet Take 1 tablet (20 mg total) by mouth daily. 02/19/20   [name], MD  Multiple Vitamin (MULTIVITAMIN) tablet Take 1 tablet by mouth daily.    [provider]  oxyCODONE (OXY IR/ROXICODONE) 5 MG immediate release tablet Take 1-2 tablets (5-10 mg total) by mouth 2 (two) times daily as needed for severe pain. 04/09/20   [name], MD  polyethylene glycol (MIRALAX / GLYCOLAX) 17 g packet Take 17 g by mouth daily. 10/29/19   Domenic Polite, MD  prochlorperazine (COMPAZINE) 10 MG tablet Take 1 tablet (10 mg total) by mouth every 6 (six) hours as needed for nausea or vomiting. 04/16/20   [name], MD  rivaroxaban (XARELTO) 10 MG TABS tablet Take 1 tablet (10 mg total) by mouth daily. Patient not taking: Reported on 04/18/2020 11/08/19   Leandrew Koyanagi, MD  rosuvastatin (CRESTOR) 10 MG tablet Take 10 mg by mouth at bedtime. 11/03/19   [provider]  Tiotropium Bromide Monohydrate (SPIRIVA RESPIMAT) 2.5 MCG/ACT AERS Inhale 2 puffs into the lungs daily. 12/15/18   Collene Gobble, MD  TURMERIC PO Take 2 tablets by mouth daily.    [provider]  Family History  Problem Relation Age of Onset   Stroke Mother    Cancer Father        esophageal   Cancer Paternal Grandmother 42       unknown   Prostate cancer Neg Hx    Breast cancer Neg Hx    Colon cancer Neg Hx     Social History   Socioeconomic History   Marital status: Divorced    Spouse name: Not on file   Number of children: 1   Years of education: Not on file   Highest education level: Not on file  Occupational History   Not on file  Tobacco Use   Smoking status: Former Smoker    Packs/day: 1.50    Years: 50.00    Pack  years: 75.00    Types: Cigarettes    Quit date: 08/20/2011    Years since quitting: 8.6   Smokeless tobacco: Never Used  Substance and Sexual Activity   Alcohol use: Yes    Comment: 1beer a day   Drug use: No   Sexual activity: Not Currently  Other Topics Concern   Not on file  Social History Narrative   Not on file   Social Determinants of Health   Financial Resource Strain:    Difficulty of Paying Living Expenses:   Food Insecurity:    Worried About Charity fundraiser in the Last Year:    Arboriculturist in the Last Year:   Transportation Needs:    Film/video editor (Medical):    Lack of Transportation (Non-Medical):   Physical Activity:    Days of Exercise per Week:    Minutes of Exercise per Session:   Stress:    Feeling of Stress :   Social Connections:    Frequency of Communication with Friends and Family:    Frequency of Social Gatherings with Friends and Family:    Attends Religious Services:    Active Member of Clubs or Organizations:    Attends Archivist Meetings:    Marital Status:       Review of Systems currently denies fever, headache, chest pain,  cough, abdominal pain, nausea, vomiting or bleeding.  He does have some dyspnea with exertion and back pain as well as some intermittent left knee and leg pain.  Vital Signs: Blood pressure 135/85, heart rate 99, temp 98.2, respirations 18, O2 sat 98% room air   Physical Exam awake, alert.  Chest with distant breath sounds bilaterally.  Heart with regular rate and rhythm.  Abdomen soft, positive bowel sounds, nontender.  Bilateral lower extremity edema noted.  Imaging: NM Bone Scan Whole Body  Result Date: 04/12/2020 CLINICAL DATA:  Prostate cancer.  Staging. EXAM: NUCLEAR MEDICINE WHOLE BODY BONE SCAN TECHNIQUE: Whole body anterior and posterior images were obtained approximately 3 hours after intravenous injection of radiopharmaceutical. RADIOPHARMACEUTICALS:  22 mCi  Technetium-31m MDP IV COMPARISON:  Bone scan 07/04/2018. Abdomen pelvis CT performed same day. Right hip MRI 10/16/2019 also reviewed FINDINGS: Multifocal uptake throughout the skeleton consistent with metastatic disease. Skeletal findings have progressed from 2019 bone scan. Small hypermetabolic foci in the left and possibly right calvarium. Multiple foci of bilateral rib uptake, new from prior exam. Previous rib uptake in the left anterior lower ribs has improved. There is uptake involving the lumbar spine at multiple levels. Foci of uptake involving the thorax centrally may represent posterior ribs or thoracic vertebra. Multiple foci of uptake throughout the bony pelvis. Uptake in  the right femur involves the greater trochanter, patient is post right femoral pinning. Uptake in the left proximal femur appears centered on the greater trochanter. There is also activity involving the left acetabulum. Intense radiotracer activity in the left proximal tibia corresponds to a lytic lesion on recent knee radiograph. Uptake involving both shoulders may be degenerative, however there is uptake involving the right inferior scapula that is likely metastatic. Both kidneys and bladder are visualized. IMPRESSION: 1. Multifocal uptake consistent with metastatic disease. Overall bony metastatic disease has progressed from 2019 comparison. There are new lesions throughout the thoracic and lumbar spine, bilateral ribs, left and possibly right calvarium, pelvis and left proximal femur. 2. Uptake in the left proximal tibia corresponds to a lytic lesion on knee recent radiograph. Electronically Signed   By: Keith Rake M.D.   On: 04/12/2020 15:51   CT Abdomen Pelvis W Contrast  Result Date: 04/11/2020 CLINICAL DATA:  Prostate cancer staging. Chemotherapy completed 2019. Ongoing Lupron injections. EXAM: CT ABDOMEN AND PELVIS WITH CONTRAST TECHNIQUE: Multidetector CT imaging of the abdomen and pelvis was performed using the  standard protocol following bolus administration of intravenous contrast. CONTRAST:  193mL OMNIPAQUE IOHEXOL 300 MG/ML  SOLN COMPARISON:  10/24/2019 and CT chest 02/28/2019. FINDINGS: Lower chest: Tiny right lower lobe nodules measure up to 4 mm (4/2). These were not imaged on 10/24/2019 and appear new from 02/28/2019. Centrilobular emphysema. Peripheral left lower lobe nodules measure up to 7 mm in the medial left lower lobe (4/34), increased from 3 mm on 02/28/2019. Atherosclerotic calcification of the aorta and coronary arteries. Heart size normal. No pericardial or pleural effusion. Hepatobiliary: New low-attenuation nodules and masses are seen throughout the liver. Conglomerate heterogeneous mass in segment 4 measures 9.0 x 12.0 cm (2/24). Gallbladder is unremarkable. No biliary ductal dilatation. Pancreas: Negative. Spleen: Negative. Adrenals/Urinary Tract: Adrenal glands are unremarkable. Low-attenuation lesions in the kidneys measure up to 1.5 cm on the left and are likely cysts. Kidneys are otherwise unremarkable. Ureters are decompressed. Slight bladder wall thickening. Stomach/Bowel: Stomach is decompressed. Small bowel, appendix and colon are unremarkable. Vascular/Lymphatic: Atherosclerotic calcification of the aorta without aneurysm. No pathologically enlarged lymph nodes. Reproductive: Prostate is visualized. Other: No free fluid.  Mesenteries and peritoneum are unremarkable. Musculoskeletal: New mixed lysis and sclerosis in the left acetabulum. Postoperative changes in the proximal right femur seen with extensive mixed lysis and sclerosis. Lytic lesion in the posterior aspect of the proximal right femur has enlarged, now measuring 2.6 cm (2/87), compared to 1.5 cm on 10/24/2019. There is a lytic lesion in the posterior aspect of L3, new. Slight compression of the superior endplates of L1 and L2, as before. New compression and vague sclerosis involving the T11 vertebral body. IMPRESSION: 1. New  widespread hepatic metastatic disease. 2. New/enlarging lower lobe pulmonary nodules, highly worrisome for metastatic disease. 3. Osseous metastatic disease has progressed. 4. Slight bladder wall thickening can be seen with an element of outlet obstruction. 5. Aortic atherosclerosis (ICD10-I70.0). Coronary artery calcification. 6.  Emphysema (ICD10-J43.9). Electronically Signed   By: Lorin Picket M.D.   On: 04/11/2020 14:10   MR TIBIA FIBULA LEFT W WO CONTRAST  Result Date: 04/13/2020 CLINICAL DATA:  Lower leg pain, sudden onset of mass distal to the patella for 1 month. Painful with bending of the knee. EXAM: MRI OF LOWER LEFT EXTREMITY WITHOUT AND WITH CONTRAST TECHNIQUE: Multiplanar, multisequence MR imaging of the left tibia/fibula was performed both before and after administration of intravenous contrast. CONTRAST:  18mL GADAVIST  GADOBUTROL 1 MMOL/ML IV SOLN COMPARISON:  Radiographs of 04/08/2020 and bone scan of 04/11/2020 FINDINGS: Bones/Joint/Cartilage 5.2 by 3.2 by 2.9 cm aggressive appearing mass of the left proximal tibia with surrounding marrow edema, low T1 and accentuated T2 signal, and moderate enhancement. There is cortical destruction anteriorly along the greater tuberosity, and the patellar tendon appears to partially attached to this mass and partially attached to the rim of bone just above this mass on image 23/9, which may undermined the biomechanical resiliency of the distal attachment site. In addition to bony expansion, there is likely some localized extra osseous extension of tumor anteriorly and medially for example on image 20/10. This lesion corresponds to the accentuated activity in the left proximal tibia shown on recent bone scan. In the context of the patient's prostate cancer this is highly likely to be a metastatic lesion. There geodes along the right and left tibial spine. No other regional metastatic lesions are identified. Today's exam uses large regions of interest and  does not well suited to assessing the menisci. That said, there are some findings suspicious for radial tear of the midbody medial meniscus on the left, and possible oblique tear of the posterior horn medial meniscus on the right. False positive rate is significantly higher than on a dedicated knee MRI. Ligaments N/A Muscles and Tendons Low-grade edema in the tibialis anterior muscle medially is likely reactive. Soft tissues Likely extraosseous extension of tumor is noted above, otherwise unremarkable. IMPRESSION: 1. 5.2 by 3.2 by 2.9 cm aggressive appearing mass of the left proximal tibia with suspected extraosseous extension of tumor anteriorly and medially. This is highly likely to represent a metastatic lesion given the patient's history of prostate cancer. 2. The patellar tendon distal attachment is partly along the mass and partly along the rim of bone just above the mass, which may undermine the biomechanical resiliency of the distal attachment. 3. Suspected radial tears of the medial meniscus on the left side, and possible oblique tear of the posterior horn medial meniscus. However, the large field of view on today's exam leads one to reasonably expect a higher false positive rate than on a dedicated knee MRI. 4. Low-grade edema in the tibialis anterior muscle medially, likely reactive. Electronically Signed   By: Van Clines M.D.   On: 04/13/2020 10:26   XR HIP UNILAT W OR W/O PELVIS 2-3 VIEWS RIGHT  Result Date: 04/08/2020 Stable fixation of intertrochanteric pathologic fracture  XR KNEE 3 VIEW LEFT  Result Date: 04/08/2020 There is an intraosseous circular lesion in the proximal tibia   Labs:  CBC: Recent Labs    10/30/19 0537 12/18/19 0750 02/19/20 0808 04/11/20 0750  WBC 12.0* 6.5 7.4 8.2  HGB 8.4* 12.6* 13.2 12.2*  HCT 26.2* 39.1 40.1 37.4*  PLT 677* 401* 385 462*    COAGS: No results for input(s): INR, APTT in the last 8760 hours.  BMP: Recent Labs     10/29/19 0526 12/18/19 0750 02/19/20 0808 04/11/20 0750  NA 136 138 138 138  K 4.2 4.0 4.9 5.3*  CL 104 100 103 99  CO2 23 25 27 25   GLUCOSE 97 130* 112* 110*  BUN 9 16 14 18   CALCIUM 8.7* 9.8 9.8 9.8  CREATININE 0.66 0.93 0.83 1.16  GFRNONAA >60 >60 >60 >60  GFRAA >60 >60 >60 >60    LIVER FUNCTION TESTS: Recent Labs    10/21/19 0625 10/23/19 0345 10/24/19 0243 12/18/19 0750 02/19/20 0808 04/11/20 0750  BILITOT 0.7  --   --  0.4 0.3 0.7  AST 43*  --   --  14* 22 63*  ALT 13  --   --  6 13 33  ALKPHOS 197*  --   --  82 119 358*  PROT 6.4*  --   --  7.5 7.4 7.7  ALBUMIN 3.0*   < > 2.2* 3.8 3.9 3.5   < > = values in this interval not displayed.    TUMOR MARKERS: No results for input(s): AFPTM, CEA, CA199, CHROMGRNA in the last 8760 hours.  Assessment and Plan: 69 y.o. male smoker with history of advanced prostate cancer initially diagnosed in 2019 and now with disease progression to the bone and liver who presents today for Port-A-Cath placement for chemotherapy.Risks and benefits of image guided port-a-catheter placement was discussed with the patient including, but not limited to bleeding, infection, pneumothorax, or fibrin sheath development and need for additional procedures.  All of the patient's questions were answered, patient is agreeable to proceed. Consent signed and in chart.      Thank you for this interesting consult.  I greatly enjoyed meeting MCADOO MUZQUIZ and look forward to participating in their care.  A copy of this report was sent to the requesting provider on this date.  Electronically Signed: D. Rowe Robert, PA-C 04/23/2020, 11:57 AM   I spent a total of 25 minutes  in face to face in clinical consultation, greater than 50% of which was counseling/coordinating care for Port-A-Cath placement

## 2020-04-23 NOTE — Progress Notes (Signed)
[place] Work  Clinical Social Work received follow up call from patient's granddaughter, Sport and exercise psychologist.  She stated patient is unable to prepare meals at home and is  Looking for resources.  CSW made referral to [employer] program through [place].       Gwinda Maine, LCSW  Clinical Social Worker [facility]

## 2020-04-23 NOTE — Procedures (Signed)
Interventional Radiology Procedure Note  Procedure: Placement of a right IJ approach single lumen PowerPort.  Tip is positioned at the superior cavoatrial junction and catheter is ready for immediate use.  Complications: No immediate Recommendations:  - Ok to shower tomorrow - Do not submerge for 7 days - Routine line care   Signed,  Laela Deviney K. Carmelo Reidel, MD   

## 2020-04-23 NOTE — Progress Notes (Signed)
  Radiation Oncology         [phone] ________________________________  Name: Anthony Escobar MRN: [id number]  Date: 04/23/2020  DOB: [birth year]  SIMULATION AND TREATMENT PLANNING NOTE    ICD-10-CM   1. Prostate cancer metastatic to bone ([place])  C61    C79.51   2. Prostate cancer ([place])  C61   3. Pathologic intertrochanteric fracture, right, initial encounter [facility])  M84.451A     DIAGNOSIS:   69 y.o.gentleman with castrate-resistant prostate cancer with osseous metastases presenting today with a new, painful metastasis in the proximal left tibia and left acetabulum  NARRATIVE:  The patient was brought to the [place] [facility].  Identity was confirmed.  All relevant records and images related to the planned course of therapy were reviewed.  The patient freely provided informed written consent to proceed with treatment after reviewing the details related to the planned course of therapy. The consent form was witnessed and verified by the simulation staff.  Then, the patient was set-up in a stable reproducible  supine position for radiation therapy.  CT images were obtained.  Surface markings were placed.  The CT images were loaded into the planning software.  Then the target and avoidance structures were contoured.  Treatment planning then occurred.  The radiation prescription was entered and confirmed.  Then, I designed and supervised the construction of a total of at least 5 medically necessary complex treatment devices to immobilize and shield critical organs.  I have requested : Isodose Plan.    PLAN:  The patient will receive 30 Gy in 10 fractions to the left tibia and acetabulum.  ________________________________  Sheral Apley Tammi Klippel, M.D.

## 2020-04-23 NOTE — Progress Notes (Signed)
At the time of CT SIM/treatment planning this morning, the patient reports that he is having progressive pain in the left hip and right knee.  Dr. Tammi Klippel and I personally reviewed his most recent bone scan and noted that there is significant disease in the left proximal femur and acetabulum that is likely contributing to the left hip pain.  There was not anything notable in the right knee on imaging so I discussed with the patient the recommendation to follow up with Dr. Erlinda Hong for possible further imaging of the right knee and/or fluid aspiration since he is complaining of swelling in the right knee. It may be that he has aggravated some OA in the right knee due to favoring the left side secondary to pain. I did discuss the recommendation to move forward with treatment planning for the left proximal tibia and will also add the left proximal femur and acetabulum for treatment at the same time, hopefully to begin tomorrow, 04/24/20.  He and his granddaughter, Sport and exercise psychologist (RN in the ED here at [facility]) appear to have a good understanding of our recommendations for treatment which a re of palliative intent and are comfortable and in agreement with the stated plan as above.  He freely signed written consent to proceed and a copy of this document will be placed in his medical record.   [name], MMS, PA-C [place] [facility] [place]: [phone]  Fax: [phone]

## 2020-04-23 NOTE — Discharge Instructions (Signed)
Urgent needs - IR on call MD [phone]  Wound - May remove dressing and shower in 24 to 48 hours.  Keep site clean and dry.  Replace with bandaid. Do not submerge in tub or water until site healing well.  If ordered by your provider, may start Emla cream in 2 weeks or after incision is healed.  After completion of treatment, your provider should have you set up for monthly port flushes.                                                            Moderate Conscious Sedation, Adult, Care After These instructions provide you with information about caring for yourself after your procedure. Your health care provider may also give you more specific instructions. Your treatment has been planned according to current medical practices, but problems sometimes occur. Call your health care provider if you have any problems or questions after your procedure. What can I expect after the procedure? After your procedure, it is common:  To feel sleepy for several hours.  To feel clumsy and have poor balance for several hours.  To have poor judgment for several hours.  To vomit if you eat too soon. Follow these instructions at home: For at least 24 hours after the procedure:  Do not: ? Participate in activities where you could fall or become injured. ? Drive. ? Use heavy machinery. ? Drink alcohol. ? Take sleeping pills or medicines that cause drowsiness. ? Make important decisions or sign legal documents. ? Take care of children on your own.  Rest. Eating and drinking  Follow the diet recommended by your health care provider.  If you vomit: ? Drink water, juice, or soup when you can drink without vomiting. ? Make sure you have little or no nausea before eating solid foods. General instructions  Have a responsible adult stay with you until you are awake and alert.  Take over-the-counter and prescription medicines only as told by your health care provider.  If you smoke, do not smoke  without supervision.  Keep all follow-up visits as told by your health care provider. This is important. Contact a health care provider if:  You keep feeling nauseous or you keep vomiting.  You feel light-headed.  You develop a rash.  You have a fever. Get help right away if:  You have trouble breathing. This information is not intended to replace advice given to you by your health care provider. Make sure you discuss any questions you have with your health care provider. Document Revised: 11/25/2017 Document Reviewed: 04/03/2016 Elsevier Patient Education  2020 Elsevier Inc.   Implanted Port Insertion, Care After This sheet gives you information about how to care for yourself after your procedure. Your health care provider may also give you more specific instructions. If you have problems or questions, contact your health care provider. What can I expect after the procedure? After the procedure, it is common to have:  Discomfort at the port insertion site.  Bruising on the skin over the port. This should improve over 3-4 days. Follow these instructions at home: Port care  After your port is placed, you will get a manufacturer's information card. The card has information about your port. Keep this card with you at all times.  Take care of   the port as told by your health care provider. Ask your health care provider if you or a family member can get training for taking care of the port at home. A home health care nurse may also take care of the port.  Make sure to remember what type of port you have. Incision care  Follow instructions from your health care provider about how to take care of your port insertion site. Make sure you: ? Wash your hands with soap and water before and after you change your bandage (dressing). If soap and water are not available, use hand sanitizer. ? Change your dressing as told by your health care provider. ? Leave stitches (sutures), skin glue, or  adhesive strips in place. These skin closures may need to stay in place for 2 weeks or longer. If adhesive strip edges start to loosen and curl up, you may trim the loose edges. Do not remove adhesive strips completely unless your health care provider tells you to do that.  Check your port insertion site every day for signs of infection. Check for: ? Redness, swelling, or pain. ? Fluid or blood. ? Warmth. ? Pus or a bad smell. Activity  Return to your normal activities as told by your health care provider. Ask your health care provider what activities are safe for you.  Do not lift anything that is heavier than 10 lb (4.5 kg), or the limit that you are told, until your health care provider says that it is safe. General instructions  Take over-the-counter and prescription medicines only as told by your health care provider.  Do not take baths, swim, or use a hot tub until your health care provider approves. Ask your health care provider if you may take showers. You may only be allowed to take sponge baths.  Do not drive for 24 hours if you were given a sedative during your procedure.  Wear a medical alert bracelet in case of an emergency. This will tell any health care providers that you have a port.  Keep all follow-up visits as told by your health care provider. This is important. Contact a health care provider if:  You cannot flush your port with saline as directed, or you cannot draw blood from the port.  You have a fever or chills.  You have redness, swelling, or pain around your port insertion site.  You have fluid or blood coming from your port insertion site.  Your port insertion site feels warm to the touch.  You have pus or a bad smell coming from the port insertion site. Get help right away if:  You have chest pain or shortness of breath.  You have bleeding from your port that you cannot control. Summary  Take care of the port as told by your health care provider.  Keep the manufacturer's information card with you at all times.  Change your dressing as told by your health care provider.  Contact a health care provider if you have a fever or chills or if you have redness, swelling, or pain around your port insertion site.  Keep all follow-up visits as told by your health care provider. This information is not intended to replace advice given to you by your health care provider. Make sure you discuss any questions you have with your health care provider. Document Revised: 07/11/2018 Document Reviewed: 07/11/2018 Elsevier Patient Education  2020 Elsevier Inc.   

## 2020-04-24 ENCOUNTER — Inpatient Hospital Stay: Payer: Medicare Other

## 2020-04-24 ENCOUNTER — Ambulatory Visit
Admission: RE | Admit: 2020-04-24 | Discharge: 2020-04-24 | Disposition: A | Discharge status: Home / Self Care | Payer: Medicare Other | Source: Ambulatory Visit | Attending: Radiation Oncology | Admitting: Radiation Oncology

## 2020-04-24 ENCOUNTER — Other Ambulatory Visit: Payer: Self-pay | Admitting: Oncology

## 2020-04-24 ENCOUNTER — Other Ambulatory Visit: Payer: Self-pay

## 2020-04-24 VITALS — BP 101/59 | HR 92 | Temp 97.9°F | Resp 16

## 2020-04-24 DIAGNOSIS — Z510 Encounter for antineoplastic radiation therapy: Secondary | ICD-10-CM | POA: Diagnosis not present

## 2020-04-24 DIAGNOSIS — Z5111 Encounter for antineoplastic chemotherapy: Secondary | ICD-10-CM | POA: Diagnosis not present

## 2020-04-24 DIAGNOSIS — C61 Malignant neoplasm of prostate: Secondary | ICD-10-CM

## 2020-04-24 LAB — CMP (CANCER CENTER ONLY)
ALT: 33 U/L (ref 0–44)
AST: 79 U/L — ABNORMAL HIGH (ref 15–41)
Albumin: 2.9 g/dL — ABNORMAL LOW (ref 3.5–5.0)
Alkaline Phosphatase: 610 U/L — ABNORMAL HIGH (ref 38–126)
Anion gap: 16 — ABNORMAL HIGH (ref 5–15)
BUN: 28 mg/dL — ABNORMAL HIGH (ref 8–23)
CO2: 25 mmol/L (ref 22–32)
Calcium: 9.7 mg/dL (ref 8.9–10.3)
Chloride: 94 mmol/L — ABNORMAL LOW (ref 98–111)
Creatinine: 1.14 mg/dL (ref 0.61–1.24)
GFR, Est AFR Am: >60 mL/min (ref >60)
GFR, Estimated: >60 mL/min (ref >60)
Glucose, Bld: 122 mg/dL — ABNORMAL HIGH (ref 70–99)
Potassium: 5 mmol/L (ref 3.5–5.1)
Sodium: 135 mmol/L (ref 135–145)
Total Bilirubin: 0.7 mg/dL (ref 0.3–1.2)
Total Protein: 7.1 g/dL (ref 6.5–8.1)

## 2020-04-24 LAB — CBC WITH DIFFERENTIAL (CANCER CENTER ONLY)
Abs Immature Granulocytes: 0.08 10*3/uL — ABNORMAL HIGH (ref 0.00–0.07)
Basophils Absolute: 0 10*3/uL (ref 0.0–0.1)
Basophils Relative: 0 %
Eosinophils Absolute: 0 10*3/uL (ref 0.0–0.5)
Eosinophils Relative: 0 %
HCT: 32.6 % — ABNORMAL LOW (ref 39.0–52.0)
Hemoglobin: 10.5 g/dL — ABNORMAL LOW (ref 13.0–17.0)
Immature Granulocytes: 1 %
Lymphocytes Relative: 5 %
Lymphs Abs: 0.5 10*3/uL — ABNORMAL LOW (ref 0.7–4.0)
MCH: 33.3 pg (ref 26.0–34.0)
MCHC: 32.2 g/dL (ref 30.0–36.0)
MCV: 103.5 fL — ABNORMAL HIGH (ref 80.0–100.0)
Monocytes Absolute: 1.1 10*3/uL — ABNORMAL HIGH (ref 0.1–1.0)
Monocytes Relative: 11 %
Neutro Abs: 8.2 10*3/uL — ABNORMAL HIGH (ref 1.7–7.7)
Neutrophils Relative %: 83 %
Platelet Count: 458 10*3/uL — ABNORMAL HIGH (ref 150–400)
RBC: 3.15 MIL/uL — ABNORMAL LOW (ref 4.22–5.81)
RDW: 13.8 % (ref 11.5–15.5)
WBC Count: 9.8 10*3/uL (ref 4.0–10.5)
nRBC: 0 % (ref 0.0–0.2)

## 2020-04-24 MED ORDER — DIPHENHYDRAMINE HCL 50 MG/ML IJ SOLN
INTRAMUSCULAR | Status: AC
  Filled 2020-04-24: qty 1

## 2020-04-24 MED ORDER — SODIUM CHLORIDE 0.9 % IV SOLN
Freq: Once | INTRAVENOUS | Status: AC
  Filled 2020-04-24: qty 250

## 2020-04-24 MED ORDER — DIPHENHYDRAMINE HCL 50 MG/ML IJ SOLN
25.0000 mg | Freq: Once | INTRAMUSCULAR | Status: AC
  Administered 2020-04-24: 25 mg via INTRAVENOUS

## 2020-04-24 MED ORDER — SODIUM CHLORIDE 0.9% FLUSH
10.0000 mL | INTRAVENOUS | Status: DC | PRN
  Administered 2020-04-24: 10 mL
  Filled 2020-04-24: qty 10

## 2020-04-24 MED ORDER — FAMOTIDINE IN NACL 20-0.9 MG/50ML-% IV SOLN
20.0000 mg | Freq: Once | INTRAVENOUS | Status: AC
  Administered 2020-04-24: 20 mg via INTRAVENOUS

## 2020-04-24 MED ORDER — SODIUM CHLORIDE 0.9 % IV SOLN
10.0000 mg | Freq: Once | INTRAVENOUS | Status: AC
  Administered 2020-04-24: 10 mg via INTRAVENOUS
  Filled 2020-04-24: qty 10

## 2020-04-24 MED ORDER — FAMOTIDINE IN NACL 20-0.9 MG/50ML-% IV SOLN
INTRAVENOUS | Status: AC
  Filled 2020-04-24: qty 50

## 2020-04-24 MED ORDER — HEPARIN SOD (PORK) LOCK FLUSH 100 UNIT/ML IV SOLN
500.0000 [IU] | Freq: Once | INTRAVENOUS | Status: AC | PRN
  Administered 2020-04-24: 500 [IU]
  Filled 2020-04-24: qty 5

## 2020-04-24 MED ORDER — HYDROMORPHONE HCL 4 MG PO TABS: 4.0000 mg | ORAL_TABLET | ORAL | 0 refills | Status: DC | PRN

## 2020-04-24 MED ORDER — SODIUM CHLORIDE 0.9 % IV SOLN
20.0000 mg/m2 | Freq: Once | INTRAVENOUS | Status: AC
  Administered 2020-04-24: 36 mg via INTRAVENOUS
  Filled 2020-04-24: qty 3.6

## 2020-04-24 MED FILL — XTANDI 40 MG CAPSULE: 40 | 30 days supply | Qty: 120 | Fill #0

## 2020-04-24 NOTE — Patient Instructions (Addendum)
[facility] Discharge Instructions for Patients Receiving Chemotherapy  Today you received the following chemotherapy agents: Cabazitaxel (Jevtana)  To help prevent nausea and vomiting after your treatment, we encourage you to take your nausea medication as prescribed.   If you develop nausea and vomiting that is not controlled by your nausea medication, call the clinic.   BELOW ARE SYMPTOMS THAT SHOULD BE REPORTED IMMEDIATELY:  *FEVER GREATER THAN 100.5 F  *CHILLS WITH OR WITHOUT FEVER  NAUSEA AND VOMITING THAT IS NOT CONTROLLED WITH YOUR NAUSEA MEDICATION  *UNUSUAL SHORTNESS OF BREATH  *UNUSUAL BRUISING OR BLEEDING  TENDERNESS IN MOUTH AND THROAT WITH OR WITHOUT PRESENCE OF ULCERS  *URINARY PROBLEMS  *BOWEL PROBLEMS  UNUSUAL RASH Items with * indicate a potential emergency and should be followed up as soon as possible.  Feel free to call the clinic should you have any questions or concerns. The clinic phone number is [phone].  Please show the [facility] at check-in to the Emergency Department and triage nurse.     Cabazitaxel injection What is this medicine? CABAZITAXEL (ka baz i TAX el) is a chemotherapy drug. It is used to treat prostate cancer. It targets fast dividing cells, like cancer cells, and causes these cells to die. This medicine may be used for other purposes; ask your health care provider or pharmacist if you have questions. COMMON BRAND NAME(S): Jevtana What should I tell my health care provider before I take this medicine? They need to know if you have any of these conditions:  history of stomach bleeding  kidney disease  liver disease  low blood counts, like low white cell, platelet, or red cell counts  lung or breathing disease, like asthma  recent or ongoing radiation therapy  take medicines that treat or prevent blood clots  an unusual or allergic reaction to cabazitaxel, polysorbate 80, other medicines,  foods, dyes, or preservatives  pregnant or trying to get pregnant  breast-feeding How should I use this medicine? This medicine is for infusion into a vein. It is given by a health care professional in a hospital or clinic setting. Talk to your pediatrician regarding the use of this medicine in children. Special care may be needed. Overdosage: If you think you have taken too much of this medicine contact a poison control center or emergency room at once. NOTE: This medicine is only for you. Do not share this medicine with others. What if I miss a dose? It is important not to miss your dose. Call your doctor or health care professional if you are unable to keep an appointment. What may interact with this medicine?  antiviral medicines for HIV or AIDS  clarithromycin  medicines for fungal infections like ketoconazole, fluconazole, itraconazole, and voriconazole  nefazodone  telithromycin This list may not describe all possible interactions. Give your health care provider a list of all the medicines, herbs, non-prescription drugs, or dietary supplements you use. Also tell them if you smoke, drink alcohol, or use illegal drugs. Some items may interact with your medicine. What should I watch for while using this medicine? Your condition will be monitored carefully while you are receiving this medicine. This drug may make you feel generally unwell. This is not uncommon, as chemotherapy can affect healthy cells as well as cancer cells. Report any side effects. Continue your course of treatment even though you feel ill unless your doctor tells you to stop. Call your doctor or health care professional for advice if you get  a fever, chills or sore throat, or other symptoms of a cold or flu. Do not treat yourself. This drug decreases your body's ability to fight infections. Try to avoid being around people who are sick. This medicine may increase your risk to bruise or bleed. Call your doctor or  health care professional if you notice any unusual bleeding. Be careful brushing and flossing your teeth or using a toothpick because you may get an infection or bleed more easily. If you have any dental work done, tell your dentist you are receiving this medicine. Avoid taking products that contain aspirin, acetaminophen, ibuprofen, naproxen, or ketoprofen unless instructed by your doctor. These medicines may hide a fever. Do not become pregnant while taking this medicine. Women should inform their doctor if they wish to become pregnant or think they might be pregnant. Men should not father a child while taking this medicine and for 3 months after stopping it. There is a potential for serious side effects to an unborn child. Talk to your health care professional or pharmacist for more information. Do not breast-feed an infant while taking this medicine. What side effects may I notice from receiving this medicine? Side effects that you should report to your doctor or health care professional as soon as possible:  allergic reactions like skin rash, itching or hives, swelling of the face, lips, or tongue  blood in the urine  breathing problems  constipation  dark urine  diarrhea  pain in the lower back or side  pain, tingling, numbness in the hands or feet  pain when urinating  severe abdominal pain  signs of infection - fever or chills, cough, sore throat, pain or difficulty passing urine  signs and symptoms of kidney injury like trouble passing urine or change in the amount of urine  signs of decreased platelets or bleeding - bruising, pinpoint red spots on the skin, black, tarry stools, blood in the urine  signs of decreased red blood cells - unusually weak or tired, fainting spells, lightheadedness  vomiting Side effects that usually do not require medical attention (report to your doctor or health care professional if they continue or are bothersome):  back pain  change in  taste  hair loss  headache  loss of appetite  muscle or joint pain  nausea  upset stomach This list may not describe all possible side effects. Call your doctor for medical advice about side effects. You may report side effects to FDA at 1-800-FDA-1088. Where should I keep my medicine? This drug is given in a hospital or clinic and will not be stored at home. NOTE: This sheet is a summary. It may not cover all possible information. If you have questions about this medicine, talk to your doctor, pharmacist, or health care provider.  2020 Elsevier/Gold Standard (2017-01-07 15:38:47)

## 2020-04-24 NOTE — Telephone Encounter (Signed)
Spoke with patient as follow up. Ashlyn, PA spoke with him late Monday afternoon and he was having bilateral lower extremity edema and he felt the Dilaudid was not helping his pain. He states that the swelling in the lower extremities has gone and he was able to sleep 8 hours last night after taking a Dilaudid. He states he is in a much better place today. He is scheduled for CT simulation 4/28 for radiation to his leg and will begin chemo 4/29. He was very appreciative of the call and I asked him to call me if I can assist him. He voiced understanding.

## 2020-04-25 ENCOUNTER — Other Ambulatory Visit: Payer: Self-pay

## 2020-04-25 ENCOUNTER — Ambulatory Visit
Admission: RE | Admit: 2020-04-25 | Discharge: 2020-04-25 | Disposition: A | Discharge status: Home / Self Care | Payer: Medicare Other | Source: Ambulatory Visit | Attending: Radiation Oncology | Admitting: Radiation Oncology

## 2020-04-25 ENCOUNTER — Telehealth: Payer: Self-pay | Admitting: *Deleted

## 2020-04-25 DIAGNOSIS — Z510 Encounter for antineoplastic radiation therapy: Secondary | ICD-10-CM | POA: Diagnosis not present

## 2020-04-25 LAB — PROSTATE-SPECIFIC AG, SERUM (LABCORP): Prostate Specific Ag, Serum: 77.7 ng/mL — ABNORMAL HIGH (ref 0.0–4.0)

## 2020-04-25 NOTE — Telephone Encounter (Signed)
Called pt to see how he did with his treatment yesterday & he reports doing well with no c/o's except for his usual pain.  He had just left the pharmacy to get Hydromorphone.  He states that he has not had any dental work/surgery & his granddaughter talked with someone & was told that it wasn't needed.  Dr Alen Blew was delaying shot (xgeva) until dental work done.  He wanted to make sure Dr Alen Blew knew he hasn't had & not scheduled. Dr Alen Blew informed.

## 2020-04-25 NOTE — Telephone Encounter (Signed)
-----   Message from Jolaine Click, RN sent at 04/24/2020  9:46 AM EDT ----- Regarding: Dr. Alen Blew - First Chemo Follow-Up First Chemo Follow-up

## 2020-04-26 ENCOUNTER — Other Ambulatory Visit: Payer: Self-pay

## 2020-04-26 ENCOUNTER — Inpatient Hospital Stay: Payer: Medicare Other | Attending: Oncology

## 2020-04-26 VITALS — BP 104/57 | HR 65 | Temp 98.5°F | Resp 18

## 2020-04-26 DIAGNOSIS — Z5189 Encounter for other specified aftercare: Secondary | ICD-10-CM | POA: Diagnosis not present

## 2020-04-26 DIAGNOSIS — Z923 Personal history of irradiation: Secondary | ICD-10-CM | POA: Diagnosis not present

## 2020-04-26 DIAGNOSIS — R197 Diarrhea, unspecified: Secondary | ICD-10-CM | POA: Insufficient documentation

## 2020-04-26 DIAGNOSIS — R112 Nausea with vomiting, unspecified: Secondary | ICD-10-CM | POA: Insufficient documentation

## 2020-04-26 DIAGNOSIS — C61 Malignant neoplasm of prostate: Secondary | ICD-10-CM | POA: Diagnosis present

## 2020-04-26 DIAGNOSIS — Z79899 Other long term (current) drug therapy: Secondary | ICD-10-CM | POA: Insufficient documentation

## 2020-04-26 DIAGNOSIS — R400 Somnolence: Secondary | ICD-10-CM | POA: Insufficient documentation

## 2020-04-26 DIAGNOSIS — Z7901 Long term (current) use of anticoagulants: Secondary | ICD-10-CM | POA: Insufficient documentation

## 2020-04-26 DIAGNOSIS — C7951 Secondary malignant neoplasm of bone: Secondary | ICD-10-CM | POA: Diagnosis not present

## 2020-04-26 DIAGNOSIS — M25551 Pain in right hip: Secondary | ICD-10-CM | POA: Diagnosis not present

## 2020-04-26 DIAGNOSIS — Z5111 Encounter for antineoplastic chemotherapy: Secondary | ICD-10-CM | POA: Diagnosis present

## 2020-04-26 DIAGNOSIS — D709 Neutropenia, unspecified: Secondary | ICD-10-CM | POA: Insufficient documentation

## 2020-04-26 DIAGNOSIS — K5900 Constipation, unspecified: Secondary | ICD-10-CM | POA: Insufficient documentation

## 2020-04-26 MED ORDER — PEGFILGRASTIM-CBQV 6 MG/0.6ML ~~LOC~~ SOSY
6.0000 mg | PREFILLED_SYRINGE | Freq: Once | SUBCUTANEOUS | Status: AC
  Administered 2020-04-26: 6 mg via SUBCUTANEOUS

## 2020-04-26 NOTE — Patient Instructions (Signed)

## 2020-04-28 ENCOUNTER — Ambulatory Visit
Admission: RE | Admit: 2020-04-28 | Discharge: 2020-04-28 | Disposition: A | Discharge status: Home / Self Care | Payer: Medicare Other | Source: Ambulatory Visit | Attending: Radiation Oncology | Admitting: Radiation Oncology

## 2020-04-28 ENCOUNTER — Other Ambulatory Visit: Payer: Self-pay

## 2020-04-28 DIAGNOSIS — C61 Malignant neoplasm of prostate: Secondary | ICD-10-CM | POA: Insufficient documentation

## 2020-04-28 DIAGNOSIS — Z510 Encounter for antineoplastic radiation therapy: Secondary | ICD-10-CM | POA: Diagnosis present

## 2020-04-28 DIAGNOSIS — C7951 Secondary malignant neoplasm of bone: Secondary | ICD-10-CM | POA: Diagnosis not present

## 2020-04-29 ENCOUNTER — Other Ambulatory Visit: Payer: Self-pay

## 2020-04-29 ENCOUNTER — Ambulatory Visit
Admission: RE | Admit: 2020-04-29 | Discharge: 2020-04-29 | Disposition: A | Discharge status: Home / Self Care | Payer: Medicare Other | Source: Ambulatory Visit | Attending: Radiation Oncology | Admitting: Radiation Oncology

## 2020-04-29 ENCOUNTER — Encounter: Payer: Self-pay | Admitting: Medical Oncology

## 2020-04-29 ENCOUNTER — Telehealth: Payer: Self-pay

## 2020-04-29 DIAGNOSIS — Z510 Encounter for antineoplastic radiation therapy: Secondary | ICD-10-CM | POA: Diagnosis not present

## 2020-04-29 NOTE — Telephone Encounter (Signed)
Patient called office inquiring if he should continue taking Xtandi.  Patient started systemic chemo on 04/24/20. Per Dr. Alen Blew, patient is to stop taking the [facility].  Informed patient of Dr. Hazeline Junker response and patient verbalized understanding. All questions were answered during phone call.

## 2020-04-30 ENCOUNTER — Ambulatory Visit
Admission: RE | Admit: 2020-04-30 | Discharge: 2020-04-30 | Disposition: A | Discharge status: Home / Self Care | Payer: Medicare Other | Source: Ambulatory Visit | Attending: Radiation Oncology | Admitting: Radiation Oncology

## 2020-04-30 ENCOUNTER — Other Ambulatory Visit: Payer: Self-pay

## 2020-04-30 DIAGNOSIS — Z510 Encounter for antineoplastic radiation therapy: Secondary | ICD-10-CM | POA: Diagnosis not present

## 2020-05-01 ENCOUNTER — Ambulatory Visit
Admission: RE | Admit: 2020-05-01 | Discharge: 2020-05-01 | Disposition: A | Discharge status: Home / Self Care | Payer: Medicare Other | Source: Ambulatory Visit | Attending: Radiation Oncology | Admitting: Radiation Oncology

## 2020-05-01 ENCOUNTER — Other Ambulatory Visit: Payer: Self-pay

## 2020-05-01 DIAGNOSIS — Z510 Encounter for antineoplastic radiation therapy: Secondary | ICD-10-CM | POA: Diagnosis not present

## 2020-05-01 NOTE — Progress Notes (Signed)
Received patient in the clinic following radiation therapy. Patient requesting to speak with nurse about right hip pain. Patient drowsy. Patient denies taking dilaudid, tylenol arthritis or oxycodone in the last 24 hours. Patient confirms he is no longer taking oxycodone because he ran out. Also, patient reports he ran out of tylenol arthritis but his "granddaughter is going to pick up some tonight". Patient reports he hasn't taken any dilaudid today because his son "a PA" told him it is a very strong medication and now he is fearful to take it. Patient recalled a story about being found by his children "out of it" after taking pain medication and drinking alcohol in an attempt to relieve his pain. When questioned he confirms he is no longer drinking alcohol.Stressed that pain management is very important part of therapy. Reinforced how intense bone pain can be. Assured patient its ok to take pain medication as prescribed to manage bone pain. Encouraged patient to take dilaudid every four hours if pain is great than 5 on a scale of 0-10. Explained that it is likely his appetite and sleep patterns will improve with better pain management. Patient verbalized understanding. Instructed patient to follow up with Dr. Roxanne Mins, reference increasing right hip pain. Patient endorses improved left pelvic and left lower extremity pain. Patient committed to phoning Dr. Erlinda Hong about his right hip. Patient understands he will follow up with Dr. Tammi Klippel after treatment tomorrow for PUT. Wheeled patient to lobby for discharge home with family member.

## 2020-05-02 ENCOUNTER — Ambulatory Visit
Admission: RE | Admit: 2020-05-02 | Discharge: 2020-05-02 | Disposition: A | Discharge status: Home / Self Care | Payer: Medicare Other | Source: Ambulatory Visit | Attending: Radiation Oncology | Admitting: Radiation Oncology

## 2020-05-02 ENCOUNTER — Other Ambulatory Visit: Payer: Self-pay

## 2020-05-02 DIAGNOSIS — Z510 Encounter for antineoplastic radiation therapy: Secondary | ICD-10-CM | POA: Diagnosis not present

## 2020-05-05 ENCOUNTER — Other Ambulatory Visit: Payer: Self-pay

## 2020-05-05 ENCOUNTER — Ambulatory Visit
Admission: RE | Admit: 2020-05-05 | Discharge: 2020-05-05 | Disposition: A | Discharge status: Home / Self Care | Payer: Medicare Other | Source: Ambulatory Visit | Attending: Radiation Oncology | Admitting: Radiation Oncology

## 2020-05-05 DIAGNOSIS — Z510 Encounter for antineoplastic radiation therapy: Secondary | ICD-10-CM | POA: Diagnosis not present

## 2020-05-06 ENCOUNTER — Telehealth: Payer: Self-pay

## 2020-05-06 ENCOUNTER — Ambulatory Visit
Admission: RE | Admit: 2020-05-06 | Discharge: 2020-05-06 | Disposition: A | Discharge status: Home / Self Care | Payer: Medicare Other | Source: Ambulatory Visit | Attending: Radiation Oncology | Admitting: Radiation Oncology

## 2020-05-06 ENCOUNTER — Other Ambulatory Visit: Payer: Self-pay

## 2020-05-06 DIAGNOSIS — Z510 Encounter for antineoplastic radiation therapy: Secondary | ICD-10-CM | POA: Diagnosis not present

## 2020-05-06 NOTE — Telephone Encounter (Signed)
Called patient's granddaughter [name] and made her aware of Dr. Hazeline Junker response. She verbalized understanding and is aware to contact the office with any further questions or concerns.

## 2020-05-06 NOTE — Telephone Encounter (Signed)
-----   Message from [name], MD sent at 05/06/2020 11:40 AM EDT ----- Noted.  The swelling is related to chemotherapy.  I will evaluate his groin with the next visit.  This is likely related to his cancer and he is getting treated with both chemotherapy and radiation to combat that.  Thanks ----- Message ----- From: Tami Lin, RN Sent: 05/06/2020  11:29 AM EDT To: [name], MD  Patient's granddaughter called to report patient had 3+ pitting edema in his left foot last night. She said the swelling decreased after elevation. She also wants to make you aware that patient has a knot in his right groin that is increasingly painful. Per granddaughter patient made you aware of the knot at his last visit but the pain is getting worse. Patient has radiation today and next appointment to see you is on 05/15/20. Lanelle Bal

## 2020-05-07 ENCOUNTER — Other Ambulatory Visit: Payer: Self-pay | Admitting: Oncology

## 2020-05-07 ENCOUNTER — Ambulatory Visit
Admission: RE | Admit: 2020-05-07 | Discharge: 2020-05-07 | Disposition: A | Discharge status: Home / Self Care | Payer: Medicare Other | Source: Ambulatory Visit | Attending: Radiation Oncology | Admitting: Radiation Oncology

## 2020-05-07 ENCOUNTER — Other Ambulatory Visit: Payer: Self-pay

## 2020-05-07 ENCOUNTER — Encounter: Payer: Self-pay | Admitting: Radiation Oncology

## 2020-05-07 ENCOUNTER — Telehealth: Payer: Self-pay

## 2020-05-07 DIAGNOSIS — Z510 Encounter for antineoplastic radiation therapy: Secondary | ICD-10-CM | POA: Diagnosis not present

## 2020-05-07 MED ORDER — HYDROMORPHONE HCL 4 MG PO TABS: 4.0000 mg | ORAL_TABLET | ORAL | 0 refills | Status: DC | PRN

## 2020-05-08 ENCOUNTER — Ambulatory Visit: Payer: Self-pay

## 2020-05-08 ENCOUNTER — Ambulatory Visit: Payer: Medicare Other | Admitting: Orthopaedic Surgery

## 2020-05-08 ENCOUNTER — Ambulatory Visit (HOSPITAL_COMMUNITY)
Admission: RE | Admit: 2020-05-08 | Discharge: 2020-05-08 | Disposition: A | Discharge status: Home / Self Care | Payer: Medicare Other | Source: Ambulatory Visit | Attending: Orthopaedic Surgery | Admitting: Orthopaedic Surgery

## 2020-05-08 ENCOUNTER — Encounter: Payer: Self-pay | Admitting: Orthopaedic Surgery

## 2020-05-08 DIAGNOSIS — M79604 Pain in right leg: Secondary | ICD-10-CM | POA: Diagnosis present

## 2020-05-08 DIAGNOSIS — M79605 Pain in left leg: Secondary | ICD-10-CM | POA: Insufficient documentation

## 2020-05-08 DIAGNOSIS — M1711 Unilateral primary osteoarthritis, right knee: Secondary | ICD-10-CM | POA: Diagnosis not present

## 2020-05-08 MED ORDER — BUPIVACAINE HCL 0.5 % IJ SOLN
2.0000 mL | INTRAMUSCULAR | Status: AC | PRN
  Administered 2020-05-08: 2 mL via INTRA_ARTICULAR

## 2020-05-08 MED ORDER — LIDOCAINE HCL 1 % IJ SOLN
2.0000 mL | INTRAMUSCULAR | Status: AC | PRN
  Administered 2020-05-08: 2 mL

## 2020-05-08 MED ORDER — METHYLPREDNISOLONE ACETATE 40 MG/ML IJ SUSP
40.0000 mg | INTRAMUSCULAR | Status: AC | PRN
  Administered 2020-05-08: 40 mg via INTRA_ARTICULAR

## 2020-05-08 NOTE — Progress Notes (Addendum)
Office Visit Note   Patient: Anthony Escobar           Date of Birth: 01-Jan-1951           MRN: [id number] Visit Date: 05/08/2020              Requested by: [name], MD [place],  [place] [id number] PCP: [name], MD   Assessment & Plan: Visit Diagnoses:  1. Pain in right leg   2. Primary osteoarthritis of right knee   3. Pain in left leg     Plan: Impression is pitting edema and right hip and knee pain.  His knee does demonstrate osteoarthritis so we agreed to try cortisone injection today.  With his amount of pitting edema I think we need to get a Doppler to rule out DVT as he is high risk for this.  I will notify the patient of the Doppler results  Follow-Up Instructions: Return if symptoms worsen or fail to improve.   Orders:  Orders Placed This Encounter  Procedures  . XR FEMUR, MIN 2 VIEWS RIGHT  . XR Tibia/Fibula Right  . VAS [place] LOWER EXTREMITY VENOUS (DVT)   No orders of the defined types were placed in this encounter.     Procedures: Large Joint Inj: R knee on 05/08/2020 11:32 AM Indications: pain Details: 22 G needle  Arthrogram: No  Medications: 40 mg methylPREDNISolone acetate 40 MG/ML; 2 mL lidocaine 1 %; 2 mL bupivacaine 0.5 % Consent was given by the patient. Patient was prepped and draped in the usual sterile fashion.       Clinical Data: No additional findings.   Subjective: Chief Complaint  Patient presents with  . Right Leg - Pain    Anthony Escobar follows up for bilateral leg swelling and painful right hip and knee.  He has undergone radiation for the left hip and left proximal femur.  He is currently in the midst of 6 weeks of chemotherapy.  He has a lot of pitting edema.   Review of Systems  Constitutional: Negative.   All other systems reviewed and are negative.    Objective: Vital Signs: There were no vitals taken for this visit.  Physical Exam Vitals and nursing note reviewed.    Constitutional:      Appearance: He is well-developed.  Pulmonary:     Effort: Pulmonary effort is normal.  Abdominal:     Palpations: Abdomen is soft.  Skin:    General: Skin is warm.  Neurological:     Mental Status: He is alert and oriented to person, place, and time.  Psychiatric:        Behavior: Behavior normal.        Thought Content: Thought content normal.        Judgment: Judgment normal.     Ortho Exam Bilateral lower extremity shows pitting edema.  Painful range of motion of the hip. Specialty Comments:  No specialty comments available.  Imaging: XR FEMUR, MIN 2 VIEWS RIGHT  Result Date: 05/08/2020 Stable IM fixation.  Basicervical femoral neck fracture.  There is tumor throughout the proximal femur.  No hardware complications.  Difficult to assess fracture healing based on x-rays today  XR Tibia/Fibula Right  Result Date: 05/08/2020 Moderate osteoarthritis of the right knee.  No acute abnormalities.    PMFS History: Patient Active Problem List   Diagnosis Date Noted  . Prostate cancer metastatic to bone ([facility]) 11/09/2019  .  Hyperkalemia 10/19/2019  . Hyponatremia 10/19/2019  . Pathologic intertrochanteric fracture, right, initial encounter ([facility]) 10/17/2019  . Acute bronchitis 12/15/2018  . Goals of care, counseling/discussion 04/27/2018  . Prostate cancer ([facility]) 01/19/2018  . Multiple pulmonary nodules 10/28/2017  . COPD (chronic obstructive pulmonary disease) ([facility]) 10/18/2017  . Tobacco use 10/18/2017   Past Medical History:  Diagnosis Date  . Cancer of lung ([facility])   . Dyspnea   . Hyperlipidemia   . Hypertension   . Prostate cancer [facility])     Family History  Problem Relation Age of Onset  . Stroke Mother   . Cancer Father        esophageal  . Cancer Paternal Grandmother 35       unknown  . Prostate cancer Neg Hx   . Breast cancer Neg Hx   . Colon cancer Neg Hx     Past Surgical History:  Procedure Laterality Date  . HERNIA REPAIR  1988    bilateral inguinal  . INTRAMEDULLARY (IM) NAIL INTERTROCHANTERIC Right 10/22/2019   Procedure: RIGHT INTERTROCHANTERIC INTRAMEDULLARY (IM) NAIL;  Surgeon: Leandrew Koyanagi, MD;  Location: [address];  Service: Orthopedics;  Laterality: Right;  . IR IMAGING GUIDED PORT INSERTION  04/23/2020   Social History   Occupational History  . Not on file  Tobacco Use  . Smoking status: Former Smoker    Packs/day: 1.50    Years: 50.00    Pack years: 75.00    Types: Cigarettes    Quit date: 08/20/2011    Years since quitting: 8.7  . Smokeless tobacco: Never Used  Substance and Sexual Activity  . Alcohol use: Yes    Comment: 1beer a day  . Drug use: No  . Sexual activity: Not Currently    This is an addendum to your office note. This is in reference to the patient's need for a light wheelchair. Based on my face-to-face encounter with the patient and evaluation of his multiple skeletal metastatic disease the patient requires a light wheelchair for safe ambulation. He has significant limitations in his ability to ambulate safely due to his impending pathologic fractures of his lower extremities. He cannot use devices such as a cane, crutches, walker due to high risk fracturing through the metastatic lesions therefore he will need a light wheelchair. He has demonstrated to me the ability to safely propel the wheelchair himself.  Azucena Cecil, MD [facility] [phone] 7:51 AM

## 2020-05-08 NOTE — Progress Notes (Signed)
Lower extremity venous has been completed.   Preliminary results in CV Proc.   Abram Sander 05/08/2020 2:45 PM

## 2020-05-09 NOTE — Progress Notes (Signed)
Pharmacist Chemotherapy Monitoring - Follow Up Assessment    I verify that I have reviewed each item in the below checklist:  . Regimen for the patient is scheduled for the appropriate day and plan matches scheduled date. Marland Kitchen Appropriate non-routine labs are ordered dependent on drug ordered. . If applicable, additional medications reviewed and ordered per protocol based on lifetime cumulative doses and/or treatment regimen.   Plan for follow-up and/or issues identified: No . I-vent associated with next due treatment: No . MD and/or nursing notified: No  Anthony Escobar K 05/09/2020 9:53 AM

## 2020-05-11 ENCOUNTER — Other Ambulatory Visit: Payer: Self-pay

## 2020-05-11 ENCOUNTER — Emergency Department (HOSPITAL_COMMUNITY): Payer: Medicare Other

## 2020-05-11 ENCOUNTER — Observation Stay (HOSPITAL_COMMUNITY): Payer: Medicare Other

## 2020-05-11 ENCOUNTER — Encounter (HOSPITAL_COMMUNITY): Payer: Self-pay | Admitting: Emergency Medicine

## 2020-05-11 ENCOUNTER — Observation Stay (HOSPITAL_COMMUNITY)
Admission: EM | Admit: 2020-05-11 | Discharge: 2020-05-12 | Disposition: A | Discharge status: Home / Self Care | Payer: Medicare Other | Attending: Internal Medicine | Admitting: Internal Medicine

## 2020-05-11 DIAGNOSIS — E871 Hypo-osmolality and hyponatremia: Secondary | ICD-10-CM | POA: Diagnosis not present

## 2020-05-11 DIAGNOSIS — Z20822 Contact with and (suspected) exposure to covid-19: Secondary | ICD-10-CM | POA: Insufficient documentation

## 2020-05-11 DIAGNOSIS — R109 Unspecified abdominal pain: Secondary | ICD-10-CM

## 2020-05-11 DIAGNOSIS — J410 Simple chronic bronchitis: Secondary | ICD-10-CM | POA: Diagnosis not present

## 2020-05-11 DIAGNOSIS — C7951 Secondary malignant neoplasm of bone: Secondary | ICD-10-CM | POA: Insufficient documentation

## 2020-05-11 DIAGNOSIS — K5900 Constipation, unspecified: Secondary | ICD-10-CM | POA: Diagnosis not present

## 2020-05-11 DIAGNOSIS — M79604 Pain in right leg: Secondary | ICD-10-CM | POA: Insufficient documentation

## 2020-05-11 DIAGNOSIS — Z7901 Long term (current) use of anticoagulants: Secondary | ICD-10-CM | POA: Insufficient documentation

## 2020-05-11 DIAGNOSIS — Z79899 Other long term (current) drug therapy: Secondary | ICD-10-CM | POA: Insufficient documentation

## 2020-05-11 DIAGNOSIS — N281 Cyst of kidney, acquired: Secondary | ICD-10-CM | POA: Diagnosis not present

## 2020-05-11 DIAGNOSIS — C61 Malignant neoplasm of prostate: Secondary | ICD-10-CM | POA: Insufficient documentation

## 2020-05-11 DIAGNOSIS — I700 Atherosclerosis of aorta: Secondary | ICD-10-CM | POA: Diagnosis not present

## 2020-05-11 DIAGNOSIS — E785 Hyperlipidemia, unspecified: Secondary | ICD-10-CM

## 2020-05-11 DIAGNOSIS — Z87891 Personal history of nicotine dependence: Secondary | ICD-10-CM | POA: Insufficient documentation

## 2020-05-11 DIAGNOSIS — Z923 Personal history of irradiation: Secondary | ICD-10-CM | POA: Insufficient documentation

## 2020-05-11 DIAGNOSIS — Z791 Long term (current) use of non-steroidal anti-inflammatories (NSAID): Secondary | ICD-10-CM | POA: Insufficient documentation

## 2020-05-11 DIAGNOSIS — R1011 Right upper quadrant pain: Secondary | ICD-10-CM

## 2020-05-11 DIAGNOSIS — D72829 Elevated white blood cell count, unspecified: Secondary | ICD-10-CM | POA: Diagnosis not present

## 2020-05-11 DIAGNOSIS — Z85118 Personal history of other malignant neoplasm of bronchus and lung: Secondary | ICD-10-CM | POA: Diagnosis not present

## 2020-05-11 DIAGNOSIS — R197 Diarrhea, unspecified: Secondary | ICD-10-CM | POA: Diagnosis not present

## 2020-05-11 DIAGNOSIS — E860 Dehydration: Secondary | ICD-10-CM | POA: Insufficient documentation

## 2020-05-11 DIAGNOSIS — C787 Secondary malignant neoplasm of liver and intrahepatic bile duct: Secondary | ICD-10-CM | POA: Diagnosis not present

## 2020-05-11 DIAGNOSIS — Z800 Family history of malignant neoplasm of digestive organs: Secondary | ICD-10-CM | POA: Insufficient documentation

## 2020-05-11 DIAGNOSIS — Z823 Family history of stroke: Secondary | ICD-10-CM | POA: Insufficient documentation

## 2020-05-11 DIAGNOSIS — J449 Chronic obstructive pulmonary disease, unspecified: Secondary | ICD-10-CM | POA: Diagnosis not present

## 2020-05-11 DIAGNOSIS — Z9221 Personal history of antineoplastic chemotherapy: Secondary | ICD-10-CM | POA: Diagnosis not present

## 2020-05-11 DIAGNOSIS — I10 Essential (primary) hypertension: Secondary | ICD-10-CM | POA: Diagnosis not present

## 2020-05-11 DIAGNOSIS — M1711 Unilateral primary osteoarthritis, right knee: Secondary | ICD-10-CM | POA: Insufficient documentation

## 2020-05-11 DIAGNOSIS — F329 Major depressive disorder, single episode, unspecified: Secondary | ICD-10-CM | POA: Diagnosis not present

## 2020-05-11 DIAGNOSIS — K529 Noninfective gastroenteritis and colitis, unspecified: Principal | ICD-10-CM | POA: Insufficient documentation

## 2020-05-11 DIAGNOSIS — G8929 Other chronic pain: Secondary | ICD-10-CM | POA: Insufficient documentation

## 2020-05-11 DIAGNOSIS — R7989 Other specified abnormal findings of blood chemistry: Secondary | ICD-10-CM

## 2020-05-11 LAB — CREATININE, SERUM
Creatinine, Ser: 0.76 mg/dL (ref 0.61–1.24)
GFR calc Af Amer: >60 mL/min (ref >60)
GFR calc non Af Amer: >60 mL/min (ref >60)

## 2020-05-11 LAB — CBC
HCT: 39.2 % (ref 39.0–52.0)
HCT: 27 % — ABNORMAL LOW (ref 39.0–52.0)
Hemoglobin: 13 g/dL (ref 13.0–17.0)
Hemoglobin: 8.7 g/dL — ABNORMAL LOW (ref 13.0–17.0)
MCH: 33.9 pg (ref 26.0–34.0)
MCH: 33.2 pg (ref 26.0–34.0)
MCHC: 33.2 g/dL (ref 30.0–36.0)
MCHC: 32.2 g/dL (ref 30.0–36.0)
MCV: 102.3 fL — ABNORMAL HIGH (ref 80.0–100.0)
MCV: 103.1 fL — ABNORMAL HIGH (ref 80.0–100.0)
Platelets: 379 10*3/uL (ref 150–400)
Platelets: 497 10*3/uL — ABNORMAL HIGH (ref 150–400)
RBC: 3.83 MIL/uL — ABNORMAL LOW (ref 4.22–5.81)
RBC: 2.62 MIL/uL — ABNORMAL LOW (ref 4.22–5.81)
RDW: 14.6 % (ref 11.5–15.5)
RDW: 14.6 % (ref 11.5–15.5)
WBC: 9.8 10*3/uL (ref 4.0–10.5)
WBC: 13.8 10*3/uL — ABNORMAL HIGH (ref 4.0–10.5)
nRBC: 0 % (ref 0.0–0.2)
nRBC: 0 % (ref 0.0–0.2)

## 2020-05-11 LAB — COMPREHENSIVE METABOLIC PANEL
ALT: 26 U/L (ref 0–44)
AST: 58 U/L — ABNORMAL HIGH (ref 15–41)
Albumin: 2.7 g/dL — ABNORMAL LOW (ref 3.5–5.0)
Alkaline Phosphatase: 374 U/L — ABNORMAL HIGH (ref 38–126)
Anion gap: 16 — ABNORMAL HIGH (ref 5–15)
BUN: 17 mg/dL (ref 8–23)
CO2: 21 mmol/L — ABNORMAL LOW (ref 22–32)
Calcium: 8.7 mg/dL — ABNORMAL LOW (ref 8.9–10.3)
Chloride: 97 mmol/L — ABNORMAL LOW (ref 98–111)
Creatinine, Ser: 0.74 mg/dL (ref 0.61–1.24)
GFR calc Af Amer: >60 mL/min (ref >60)
GFR calc non Af Amer: >60 mL/min (ref >60)
Glucose, Bld: 89 mg/dL (ref 70–99)
Potassium: 4.2 mmol/L (ref 3.5–5.1)
Sodium: 134 mmol/L — ABNORMAL LOW (ref 135–145)
Total Bilirubin: 1.5 mg/dL — ABNORMAL HIGH (ref 0.3–1.2)
Total Protein: 5.9 g/dL — ABNORMAL LOW (ref 6.5–8.1)

## 2020-05-11 LAB — LACTIC ACID, PLASMA
Lactic Acid, Venous: 0.7 mmol/L (ref 0.5–1.9)
Lactic Acid, Venous: 1.7 mmol/L (ref 0.5–1.9)

## 2020-05-11 LAB — C DIFFICILE QUICK SCREEN W PCR REFLEX
C Diff antigen: NEGATIVE
C Diff interpretation: NOT DETECTED
C Diff toxin: NEGATIVE

## 2020-05-11 LAB — TSH: TSH: 0.943 u[IU]/mL (ref 0.350–4.500)

## 2020-05-11 LAB — LIPASE, BLOOD: Lipase: 16 U/L (ref 11–51)

## 2020-05-11 LAB — OCCULT BLOOD X 1 CARD TO LAB, STOOL: Fecal Occult Bld: POSITIVE — AB

## 2020-05-11 LAB — SARS CORONAVIRUS 2 BY RT PCR (HOSPITAL ORDER, PERFORMED IN ~~LOC~~ HOSPITAL LAB): SARS Coronavirus 2: NEGATIVE

## 2020-05-11 MED ORDER — ENSURE ENLIVE PO LIQD: 237.0000 mL | Freq: Two times a day (BID) | ORAL | Status: DC

## 2020-05-11 MED ORDER — SODIUM CHLORIDE 0.9% FLUSH: 3.0000 mL | Freq: Once | INTRAVENOUS | Status: DC

## 2020-05-11 MED ORDER — ONDANSETRON HCL 4 MG PO TABS: 4.0000 mg | ORAL_TABLET | Freq: Four times a day (QID) | ORAL | Status: DC | PRN

## 2020-05-11 MED ORDER — HYDROMORPHONE HCL 1 MG/ML IJ SOLN
1.0000 mg | Freq: Once | INTRAMUSCULAR | Status: AC
  Administered 2020-05-11: 1 mg via INTRAVENOUS
  Filled 2020-05-11: qty 1

## 2020-05-11 MED ORDER — ENOXAPARIN SODIUM 40 MG/0.4ML ~~LOC~~ SOLN
40.0000 mg | SUBCUTANEOUS | Status: DC
  Administered 2020-05-11: 40 mg via SUBCUTANEOUS
  Filled 2020-05-11: qty 0.4

## 2020-05-11 MED ORDER — ACETAMINOPHEN 325 MG PO TABS: 650.0000 mg | ORAL_TABLET | Freq: Four times a day (QID) | ORAL | Status: DC | PRN

## 2020-05-11 MED ORDER — TIOTROPIUM BROMIDE MONOHYDRATE 2.5 MCG/ACT IN AERS: 2.0000 | INHALATION_SPRAY | Freq: Every day | RESPIRATORY_TRACT | Status: DC

## 2020-05-11 MED ORDER — UMECLIDINIUM BROMIDE 62.5 MCG/INH IN AEPB
1.0000 | INHALATION_SPRAY | Freq: Every day | RESPIRATORY_TRACT | Status: DC
  Administered 2020-05-12: 1 via RESPIRATORY_TRACT
  Filled 2020-05-11: qty 7

## 2020-05-11 MED ORDER — SODIUM CHLORIDE (PF) 0.9 % IJ SOLN
INTRAMUSCULAR | Status: AC
  Filled 2020-05-11: qty 50

## 2020-05-11 MED ORDER — AMITRIPTYLINE HCL 50 MG PO TABS
75.0000 mg | ORAL_TABLET | Freq: Every day | ORAL | Status: DC
  Administered 2020-05-11: 75 mg via ORAL
  Filled 2020-05-11 (×2): qty 1

## 2020-05-11 MED ORDER — ONDANSETRON HCL 4 MG/2ML IJ SOLN
4.0000 mg | Freq: Once | INTRAMUSCULAR | Status: AC
  Administered 2020-05-11: 4 mg via INTRAVENOUS
  Filled 2020-05-11: qty 2

## 2020-05-11 MED ORDER — ONDANSETRON HCL 4 MG/2ML IJ SOLN: 4.0000 mg | Freq: Four times a day (QID) | INTRAMUSCULAR | Status: DC | PRN

## 2020-05-11 MED ORDER — METRONIDAZOLE IN NACL 5-0.79 MG/ML-% IV SOLN
500.0000 mg | Freq: Three times a day (TID) | INTRAVENOUS | Status: DC
  Administered 2020-05-11 – 2020-05-12 (×4): 500 mg via INTRAVENOUS
  Filled 2020-05-11 (×4): qty 100

## 2020-05-11 MED ORDER — SODIUM CHLORIDE 0.9% FLUSH
10.0000 mL | INTRAVENOUS | Status: DC | PRN
  Administered 2020-05-12: 10 mL

## 2020-05-11 MED ORDER — IOHEXOL 300 MG/ML  SOLN
100.0000 mL | Freq: Once | INTRAMUSCULAR | Status: AC | PRN
  Administered 2020-05-11: 100 mL via INTRAVENOUS

## 2020-05-11 MED ORDER — CHLORHEXIDINE GLUCONATE CLOTH 2 % EX PADS
6.0000 | MEDICATED_PAD | Freq: Every day | CUTANEOUS | Status: DC
  Administered 2020-05-11 – 2020-05-12 (×2): 6 via TOPICAL

## 2020-05-11 MED ORDER — SODIUM CHLORIDE 0.9 % IV BOLUS
500.0000 mL | Freq: Once | INTRAVENOUS | Status: AC
  Administered 2020-05-11: 500 mL via INTRAVENOUS

## 2020-05-11 MED ORDER — HYDROMORPHONE HCL 1 MG/ML IJ SOLN
0.5000 mg | INTRAMUSCULAR | Status: AC | PRN
  Administered 2020-05-11 – 2020-05-12 (×5): 0.5 mg via INTRAVENOUS
  Filled 2020-05-11 (×5): qty 0.5

## 2020-05-11 MED ORDER — SODIUM CHLORIDE 0.9 % IV SOLN: INTRAVENOUS | Status: AC

## 2020-05-11 MED ORDER — OXYCODONE HCL 5 MG PO TABS
5.0000 mg | ORAL_TABLET | ORAL | Status: DC | PRN
  Administered 2020-05-11 – 2020-05-12 (×2): 5 mg via ORAL
  Filled 2020-05-11 (×2): qty 1

## 2020-05-11 MED ORDER — VITAMIN D 25 MCG (1000 UNIT) PO TABS
1000.0000 [IU] | ORAL_TABLET | Freq: Every day | ORAL | Status: DC
  Administered 2020-05-11 – 2020-05-12 (×2): 1000 [IU] via ORAL
  Filled 2020-05-11 (×2): qty 1

## 2020-05-11 MED ORDER — ACETAMINOPHEN 650 MG RE SUPP: 650.0000 mg | Freq: Four times a day (QID) | RECTAL | Status: DC | PRN

## 2020-05-11 NOTE — Progress Notes (Signed)
Dr Charlann Lange aware via phone pt's stool loose, cranberry-colored and contained mucous. Specimen sent to lab. MD entered additional order for stool to be checked for occult blood.

## 2020-05-11 NOTE — Evaluation (Signed)
Physical Therapy Evaluation Patient Details Name: Anthony Escobar MRN: [id number] DOB: Apr 29, 1951 Today's Date: 05/11/2020   History of Present Illness  Anthony Escobar is a 69 y.o. male with medical history significant of metastatic prostate cancer with current chemotherapy as well as radiation therapy to left knee, HTN, HLD, COPD, former smoker who presented with nausea, diarrhea and abdominal pain.  Clinical Impression  Pt presents with dependencies in mobility secondary to the above diagnosis. Pt reports he has assistance from his daughter. Pt c/o general weakness and R hip/thigh pain with mobility and exercises. Pt does present with LE weakness right greater than left. Pt will benefit from acute skilled PT to maximize mobility and independence for return home with family providing assistance as needed.     Follow Up Recommendations Home health PT;Supervision for mobility/OOB    Equipment Recommendations  None recommended by PT    Recommendations for Other Services       Precautions / Restrictions Precautions Precautions: Fall Restrictions Weight Bearing Restrictions: No      Mobility  Bed Mobility Overal bed mobility: Needs Assistance Bed Mobility: Supine to Sit;Sit to Supine     Supine to sit: Min guard;HOB elevated Sit to supine: Min guard;HOB elevated   General bed mobility comments: use of bed rails, increased time and effort  Transfers Overall transfer level: Needs assistance Equipment used: Rolling walker (2 wheeled) Transfers: Sit to/from Stand Sit to Stand: Min guard         General transfer comment: cues for hand placement  Ambulation/Gait Ambulation/Gait assistance: Min guard Gait Distance (Feet): 12 Feet Assistive device: Rolling walker (2 wheeled) Gait Pattern/deviations: Step-through pattern;Decreased stride length;Antalgic Gait velocity: decreased   General Gait Details: min assit without RW, min guard assist with RW. decreased stance time  on right  Stairs            Wheelchair Mobility    Modified Rankin (Stroke Patients Only)       Balance Overall balance assessment: Needs assistance Sitting-balance support: No upper extremity supported Sitting balance-Leahy Scale: Good     Standing balance support: Bilateral upper extremity supported;During functional activity Standing balance-Leahy Scale: Fair                               Pertinent Vitals/Pain Pain Assessment: Faces Faces Pain Scale: Hurts little more Pain Location: Right lateral hip/thigh with exercises Pain Descriptors / Indicators: Discomfort Pain Intervention(s): Limited activity within patient's tolerance;Repositioned;Monitored during session    Home Living Family/patient expects to be discharged to:: Private residence Living Arrangements: Children Available Help at Discharge: Family Type of Home: House Home Access: Stairs to enter Entrance Stairs-Rails: Right Entrance Stairs-Number of Steps: 3-4 Home Layout: One level Home Equipment: Environmental consultant - 2 wheels;Crutches      Prior Function Level of Independence: Independent with assistive device(s)               Hand Dominance        Extremity/Trunk Assessment   Upper Extremity Assessment Upper Extremity Assessment: Defer to OT evaluation    Lower Extremity Assessment Lower Extremity Assessment: RLE deficits/detail RLE Deficits / Details: Quad 2/5, hip 2/5 with c/o pain RLE Sensation: WNL       Communication   Communication: No difficulties  Cognition Arousal/Alertness: Awake/alert Behavior During Therapy: Flat affect Overall Cognitive Status: Within Functional Limits for tasks assessed  General Comments: Pt reported a previous R LE fracuture resultion in a rod placement. Pt has residual quad and hip weakness.      General Comments General comments (skin integrity, edema, etc.): pt reported he feels weak and  tired. Pt declined ambulating in the hall.    Exercises General Exercises - Lower Extremity Ankle Circles/Pumps: AROM;Strengthening;Both;10 reps;Supine Short Arc Quad: AAROM;Strengthening;Right;10 reps;Supine Long Arc Quad: AAROM;Strengthening;Right;10 reps;Seated Heel Slides: AAROM;Strengthening;Right;10 reps;Supine Hip ABduction/ADduction: AAROM;Strengthening;Right;10 reps;Supine Straight Leg Raises: AAROM;Strengthening;Right;10 reps;Supine   Assessment/Plan    PT Assessment Patient needs continued PT services  PT Problem List Decreased strength;Decreased balance;Pain;Decreased range of motion;Decreased mobility;Decreased activity tolerance;Decreased safety awareness       PT Treatment Interventions DME instruction;Patient/family education;Balance training;Functional mobility training;Gait training;Therapeutic activities;Neuromuscular re-education;Therapeutic exercise;Stair training    PT Goals (Current goals can be found in the Care Plan section)  Acute Rehab PT Goals Patient Stated Goal: To return home PT Goal Formulation: With patient Time For Goal Achievement: 05/25/20 Potential to Achieve Goals: Good    Frequency Min 3X/week   Barriers to discharge        Co-evaluation               AM-PAC PT "6 Clicks" Mobility  Outcome Measure Help needed turning from your back to your side while in a flat bed without using bedrails?: A Little Help needed moving from lying on your back to sitting on the side of a flat bed without using bedrails?: A Little Help needed moving to and from a bed to a chair (including a wheelchair)?: A Little Help needed standing up from a chair using your arms (e.g., wheelchair or bedside chair)?: A Little Help needed to walk in hospital room?: A Little Help needed climbing 3-5 steps with a railing? : A Little 6 Click Score: 18    End of Session   Activity Tolerance: Patient limited by fatigue Patient left: in bed;with call bell/phone within  reach Nurse Communication: Mobility status PT Visit Diagnosis: Muscle weakness (generalized) (M62.81);Difficulty in walking, not elsewhere classified (R26.2)    Time: 2426-8341 PT Time Calculation (min) (ACUTE ONLY): 24 min   Charges:   PT Evaluation $PT Eval Low Complexity: 1 Low PT Treatments $Therapeutic Activity: 8-22 mins        Lelon Mast 05/11/2020, 2:09 PM

## 2020-05-11 NOTE — H&P (Signed)
History and Physical    Anthony Escobar DTO:[id number] DOB: [birth year] DOA: 05/11/2020  PCP: [name], MD Patient coming from: Home  Chief Complaint: Nausea, diarrhea and abdominal pain  HPI: Anthony Escobar is a 69 y.o. male with medical history significant of metastatic prostate cancer with current chemotherapy as well as radiation therapy to left knee, HTN, HLD, COPD, former smoker who presented with nausea, diarrhea and abdominal pain.   Patient lives at home with his granddaughter.  Patient reports that he started to have constant nausea, abdominal pain and nonbloody diarrhea 3 days ago.  His abdomen pain was located to mid abdomen area, sharp/dull pain, 5/10 with no radiation.  His diarrheas were small amount, brownish colored, " gel-like", non-bloody with multiple episodes almost hourly for last 3 days.  Associate symptoms included decreased appetite and generalized weakness.  Denies recent use of antibiotics.  Denies fevers, chills, vomiting, eating anything outside of the household.  Denies shortness of breath, cough, wheezing, chest pain, chest pressure, palpitation, dysuria, urinary frequency or urgency.  Patient [facility] ED for further evaluation today.  He was afebrile with heart rate 109, RR 22, BP 134/84 and room air O2 sats 100%.  Labs showed sodium 134, CO2 21, anion gap 16, normal creatinine, ALP 334, normal lipase, AST 58, total bilirubin 1.5, nonrevealing CBC.  CT abdomen suggested rectosigmoid colon colitis, diffuse intrahepatic meta stasis.  Patient received normal saline 500 mL bolus x1, IV Dilaudid and IV Zofran at ED.  Review of Systems: As per HPI otherwise 10 point review of systems negative.  Review of Systems Otherwise negative except as per HPI, including: General: Denies fever, chills, night sweats or unintended weight loss.Marland Kitchen Positive for generalized weakness and decreased appetite Resp: Denies cough, wheezing, shortness of breath. Cardiac: Denies  chest pain, palpitations, orthopnea, paroxysmal nocturnal dyspnea. GI: Positive for nausea, diarrheas and abdominal pain.  Negative for vomiting. GU: Denies dysuria, frequency, hesitancy or incontinence MS: Positive for chronic right leg pain Neuro: Denies headache, neurologic deficits (focal weakness, numbness, tingling), abnormal gait Psych: Denies anxiety, depression, SI/HI/AVH Skin: Denies new rashes or lesions ID: Denies sick contacts, exotic exposures, travel  Past Medical History:  Diagnosis Date  . Cancer of lung ([place])   . Dyspnea   . Hyperlipidemia   . Hypertension   . Prostate cancer [facility])     Past Surgical History:  Procedure Laterality Date  . HERNIA REPAIR  1988   bilateral inguinal  . INTRAMEDULLARY (IM) NAIL INTERTROCHANTERIC Right 10/22/2019   Procedure: RIGHT INTERTROCHANTERIC INTRAMEDULLARY (IM) NAIL;  Surgeon: Leandrew Koyanagi, MD;  Location: [place];  Service: Orthopedics;  Laterality: Right;  . IR IMAGING GUIDED PORT INSERTION  04/23/2020    SOCIAL HISTORY:  reports that he quit smoking about 8 years ago. His smoking use included cigarettes. He has a 75.00 pack-year smoking history. He has never used smokeless tobacco. He reports current alcohol use. He reports that he does not use drugs.  No Known Allergies  FAMILY HISTORY: Family History  Problem Relation Age of Onset  . Stroke Mother   . Cancer Father        esophageal  . Cancer Paternal Grandmother 12       unknown  . Prostate cancer Neg Hx   . Breast cancer Neg Hx   . Colon cancer Neg Hx      Prior to Admission medications   Medication Sig Start Date End Date Taking? Authorizing Provider  albuterol [facility] HFA)  108 (90 Base) MCG/ACT inhaler Inhale 2 puffs into the lungs every 4 (four) hours as needed for wheezing or shortness of breath. 03/14/18  Yes Collene Gobble, MD  amitriptyline (ELAVIL) 75 MG tablet Take 75 mg by mouth at bedtime.   Yes [provider]  cholecalciferol (VITAMIN D3)  25 MCG (1000 UT) tablet Take 1,000 Units by mouth daily.   Yes [provider]  Coenzyme Q10 (COQ10 PO) Take 1 tablet by mouth daily.   Yes [provider]  ibuprofen (ADVIL) 200 MG tablet Take 400 mg by mouth every 6 (six) hours as needed for moderate pain.   Yes [provider]  lisinopril (ZESTRIL) 20 MG tablet Take 1 tablet (20 mg total) by mouth daily. 02/19/20  Yes [name], MD  Multiple Vitamin (MULTIVITAMIN) tablet Take 1 tablet by mouth daily.   Yes [provider]  polyethylene glycol (MIRALAX / GLYCOLAX) 17 g packet Take 17 g by mouth daily. Patient taking differently: Take 17 g by mouth daily as needed for mild constipation.  10/29/19  Yes Domenic Polite, MD  prochlorperazine (COMPAZINE) 10 MG tablet Take 1 tablet (10 mg total) by mouth every 6 (six) hours as needed for nausea or vomiting. 04/16/20  Yes [name], MD  Tiotropium Bromide Monohydrate (SPIRIVA RESPIMAT) 2.5 MCG/ACT AERS Inhale 2 puffs into the lungs daily. 12/15/18  Yes Collene Gobble, MD  diazepam (VALIUM) 2 MG tablet Take one tab one hour prior to MRI and repeat once just prior to MRI if needed Patient not taking: Reported on 05/11/2020 04/09/20   Aundra Dubin, PA-C  enzalutamide Gillermina Phy) 40 MG capsule Take 4 capsules (160 mg total) by mouth daily. Patient not taking: Reported on 05/11/2020 03/27/20   [name], MD  HYDROmorphone (DILAUDID) 4 MG tablet Take 1 tablet (4 mg total) by mouth every 4 (four) hours as needed for severe pain. Patient not taking: Reported on 05/11/2020 05/07/20   [name], MD  oxyCODONE (OXY IR/ROXICODONE) 5 MG immediate release tablet Take 1-2 tablets (5-10 mg total) by mouth 2 (two) times daily as needed for severe pain. Patient not taking: Reported on 05/11/2020 04/09/20   [name], MD  rivaroxaban (XARELTO) 10 MG TABS tablet Take 1 tablet (10 mg total) by mouth daily. Patient not taking: Reported on 04/18/2020 11/08/19   Leandrew Koyanagi, MD    Physical Exam: Vitals:   05/11/20 0609 05/11/20 0700 05/11/20 0800 05/11/20 1000  BP:  (!) 143/80 129/77 120/71  Pulse:  (!) 101 (!) 105 (!) 105  Resp:  (!) 21 17 13   Temp:      TempSrc:      SpO2:  98% 98% 94%  Weight: 64 kg     Height: 5\' 8"  (1.727 m)         Constitutional: NAD, calm, comfortable.  Acute ill-appearing. Eyes: PERRL, lids and conjunctivae normal ENMT: Mucous membranes are moist. Posterior pharynx clear of any exudate or lesions.Normal dentition.  Neck: normal, supple, no masses, no thyromegaly Respiratory: clear to auscultation bilaterally, no wheezing, no crackles. Normal respiratory effort. No accessory muscle use.  Cardiovascular: Regular rate and rhythm, no murmurs / rubs / gallops. No extremity edema. 2+ pedal pulses. No carotid bruits.  Abdomen: no masses palpated. No hepatosplenomegaly. Bowel sounds positive.  Tenderness to palpation to epigastric area and RUQ area.  No guarding or rebound tenderness. Musculoskeletal: no clubbing / cyanosis. No joint deformity upper and lower extremities.  Good ROM, no contractures. Normal muscle tone.  Skin: no rashes, lesions, ulcers. No induration Neurologic: CN 2-12 grossly intact. Sensation intact, DTR normal. Strength 5/5 in all 4.  Psychiatric: Normal judgment and insight. Alert and oriented x 3. Normal mood.     Labs on Admission: I have personally reviewed following labs and imaging studies  CBC: Recent Labs  Lab 05/11/20 0641  WBC 9.8  HGB 13.0  HCT 39.2  MCV 102.3*  PLT 630   Basic Metabolic Panel: Recent Labs  Lab 05/11/20 0641  Pinki Rottman 134*  K 4.2  CL 97*  CO2 21*  GLUCOSE 89  BUN 17  CREATININE 0.74  CALCIUM 8.7*   GFR: Estimated Creatinine Clearance: 80 mL/min (by C-G formula based on SCr of 0.74 mg/dL). Liver Function Tests: Recent Labs  Lab 05/11/20 0641  AST 58*  ALT 26  ALKPHOS 374*  BILITOT 1.5*  PROT 5.9*  ALBUMIN 2.7*   Recent Labs  Lab 05/11/20 0641    LIPASE 16   No results for input(s): AMMONIA in the last 168 hours. Coagulation Profile: No results for input(s): INR, PROTIME in the last 168 hours. Cardiac Enzymes: No results for input(s): CKTOTAL, CKMB, CKMBINDEX, TROPONINI in the last 168 hours. BNP (last 3 results) No results for input(s): PROBNP in the last 8760 hours. HbA1C: No results for input(s): HGBA1C in the last 72 hours. CBG: No results for input(s): GLUCAP in the last 168 hours. Lipid Profile: No results for input(s): CHOL, HDL, LDLCALC, TRIG, CHOLHDL, LDLDIRECT in the last 72 hours. Thyroid Function Tests: No results for input(s): TSH, T4TOTAL, FREET4, T3FREE, THYROIDAB in the last 72 hours. Anemia Panel: No results for input(s): VITAMINB12, FOLATE, FERRITIN, TIBC, IRON, RETICCTPCT in the last 72 hours. Urine analysis:    Component Value Date/Time   COLORURINE YELLOW 10/19/2019 1609   APPEARANCEUR CLEAR 10/19/2019 1609   LABSPEC 1.011 10/19/2019 1609   PHURINE 5.0 10/19/2019 1609   GLUCOSEU NEGATIVE 10/19/2019 1609   HGBUR NEGATIVE 10/19/2019 1609   BILIRUBINUR NEGATIVE 10/19/2019 1609   [place] 10/19/2019 1609   PROTEINUR NEGATIVE 10/19/2019 1609   NITRITE NEGATIVE 10/19/2019 1609   LEUKOCYTESUR NEGATIVE 10/19/2019 1609   Sepsis Labs: !!!!!!!!!!!!!!!!!!!!!!!!!!!!!!!!!!!!!!!!!!!! @LABRCNTIP (procalcitonin:4,lacticidven:4) )No results found for this or any previous visit (from the past 240 hour(s)).   Radiological Exams on Admission: CT ABDOMEN PELVIS W CONTRAST  Result Date: 05/11/2020 CLINICAL DATA:  Diffuse abdominal pain with constipation for the last week. Bowel obstruction suspected. History of prostate cancer with last chemotherapy treatment 2 weeks ago. EXAM: CT ABDOMEN AND PELVIS WITH CONTRAST TECHNIQUE: Multidetector CT imaging of the abdomen and pelvis was performed using the standard protocol following bolus administration of intravenous contrast. CONTRAST:  139mL OMNIPAQUE IOHEXOL 300  MG/ML  SOLN COMPARISON:  CT abdomen dated 04/11/2020. FINDINGS: Lower chest: LEFT lower lobe pulmonary nodule is stable in the short-term interval, measuring 7 mm, increased from 3 mm on 02/28/2019. Hepatobiliary: Redemonstration of diffuse intrahepatic metastases. Gallbladder appears normal. No bile duct dilatation seen. Pancreas: Unremarkable. No pancreatic ductal dilatation or surrounding inflammatory changes. Spleen: Normal in size without focal abnormality. Adrenals/Urinary Tract: Adrenal glands appear normal. Bilateral renal cysts. Kidneys otherwise unremarkable without suspicious mass, stone or hydronephrosis. No ureteral or bladder calculi identified. Stomach/Bowel: Pronounced thickening of the walls of the rectosigmoid colon, with associated surrounding pericolonic inflammation/fluid stranding, new compared to the earlier CT of 04/11/2020. No dilated large or small bowel loops. Stomach is unremarkable w, decompressed. Appendix is normal. Vascular/Lymphatic: Aortic atherosclerosis. No  enlarged lymph nodes seen. Reproductive: Heterogeneous appearance of the prostate gland, stable. Other: Small amount of free fluid in the pelvis. No abscess collection seen. No free intraperitoneal air. Musculoskeletal: Mixed sclerotic and lytic lesion within the LEFT acetabulum, stable compared to the most recent CT abdomen and pelvis of 04/11/2020. Postoperative changes of the proximal RIGHT femur. Subtle lytic appearing lesion within the posterior portion of the L3 vertebral body, as also previously described. No new osseous abnormality. IMPRESSION: 1. Pronounced edematous thickening of the walls of the rectosigmoid colon, with associated surrounding pericolonic inflammation/fluid stranding, new compared to the earlier CT of 04/11/2020, consistent with a new colitis of infectious, inflammatory or ischemic nature. 2. Redemonstration of diffuse intrahepatic metastases. 3. LEFT lower lobe pulmonary nodule is stable in the  short-term interval, measuring 7 mm, increased from 3 mm on 02/28/2019. This is suspicious for a metastatic lesion. 4. Mixed sclerotic and lytic lesion within the LEFT acetabulum, stable compared to the most recent CT abdomen and pelvis of 04/11/2020, compatible with osseous metastasis. 5. Subtle lytic appearing lesion within the posterior portion of the L3 vertebral body, as also previously described, suspicious for additional osseous metastasis. 6. No bowel obstruction. Aortic Atherosclerosis (ICD10-I70.0). Electronically Signed   By: Franki Cabot M.D.   On: 05/11/2020 08:21     All images have been reviewed by me personally.  EKG: Independently reviewed.   Assessment/Plan Active Problems:   COPD (chronic obstructive pulmonary disease) (HCC)   Prostate cancer (HCC)   Hyponatremia   Prostate cancer metastatic to bone (HCC)   Colitis, acute   Dehydration   Hyperbilirubinemia   Elevated liver function tests   Liver metastasis (HCC)   Right leg pain   Essential hypertension   HLD (hyperlipidemia)   Former smoker   Assessment  plan  #Acute rectosigmoid colitis #Dehydration with mild hyponatremia  Patient presented with nausea, abdominal pain and hourly nonbloody diarrheas for 2-3 days.  Denies sick contact, fevers or vomiting.  Denies recent use of antibiotics.  He is afebrile with no leukocytosis.  Will obtain infectious work-up including stool bio fire PCR and C. Difficile.  We will also need to rule out ischemic colitis.  Lactic acid is pending.    -Observation -Covid-19 pending -Stool PCR and C. difficile pending -Lactic acid pending -IV fluid hydration -Full liquid diet -Consider GI consult if his symptoms are persistent.   #Hyperbilirubinemia # Chronically elevated ALP and AST #Metastatic prostate cancer with liver metastasis  - RUQ tenderness to palpitation noted on exam Bilirubin is mildly elevated at 1.5 which is new  - CT demonstrated diffuse liver metastasis with  normal gallbladder and no CBD -No etiology of his RUQ TTP and mildly elevated bili is likely r/t diffuse liver metastasis -We will obtain RUQ abd U/S to evaluate gallbladder and bile duct -CMP in the morning.   #Right leg pain #Prostate cancer metastatic to bone -right leg metastasis followed by orthopedic surgeon   #HTN and HLD  -Chronic and stable  #COPD and a former smoker  -No evidence of COPD exacerbation      DVT prophylaxis: Lovenox SQ Code Status: Full code Family Communication: Patient is alert oriented x3.  No family at bedside. Consults called: None Admission status: Observation  Status is: Observation  The patient remains OBS appropriate and will d/c before 2 midnights.  Dispo: The patient is from: Home              Anticipated d/c is to: Home  Anticipated d/c date is: 2 days              Patient currently is not medically stable to d/c.       Time Spent: 65 minutes.  >50% of the time was devoted to discussing the patients care, assessment, plan and disposition with other care givers along with counseling the patient about the risks and benefits of treatment.    Charlann Lange MD [facility]  If 7PM-7AM, please contact night-coverage   05/11/2020, 10:16 AM

## 2020-05-11 NOTE — ED Triage Notes (Signed)
Pt presents with complaint of abdominal pain with constipation  for the last week and no relief with stool softner or suppository. HX of bowel obstruction. Pt prostate ca pt with last chemo treatment 2 weeks ago

## 2020-05-11 NOTE — ED Provider Notes (Signed)
[facility] DEPT Provider Note   CSN: [id number] Arrival date & time: 05/11/20  0559     History Chief Complaint  Patient presents with  . Abdominal Pain    Anthony Escobar is a 69 y.o. male history of metastatic prostate cancer, hypertension, hyperlipidemia.  Patient presents today for concern of bowel obstruction.  Reports was started on a Dilaudid around 3 weeks ago.  Has had increasing constipation since that time.  Patient reports that he has not had a bowel movement in the past 1 week, reports he is no longer passing gas either.  Unable to tolerate p.o. for the past 3 days, nausea without vomiting.  Associated symptoms mild generalized abdominal cramping worsened.   Denies fever/chills, fall/injury, chest pain/shortness of breath, dysuria/hematuria or any additional concerns. HPI     Past Medical History:  Diagnosis Date  . Cancer of lung ([place])   . Dyspnea   . Hyperlipidemia   . Hypertension   . Prostate cancer [facility])     Patient Active Problem List   Diagnosis Date Noted  . Colitis, acute 05/11/2020  . Prostate cancer metastatic to bone ([place]) 11/09/2019  . Hyperkalemia 10/19/2019  . Hyponatremia 10/19/2019  . Pathologic intertrochanteric fracture, right, initial encounter ([place]) 10/17/2019  . Acute bronchitis 12/15/2018  . Goals of care, counseling/discussion 04/27/2018  . Prostate cancer ([place]) 01/19/2018  . Multiple pulmonary nodules 10/28/2017  . COPD (chronic obstructive pulmonary disease) ([place]) 10/18/2017  . Tobacco use 10/18/2017    Past Surgical History:  Procedure Laterality Date  . HERNIA REPAIR  1988   bilateral inguinal  . INTRAMEDULLARY (IM) NAIL INTERTROCHANTERIC Right 10/22/2019   Procedure: RIGHT INTERTROCHANTERIC INTRAMEDULLARY (IM) NAIL;  Surgeon: Leandrew Koyanagi, MD;  Location: [facility];  Service: Orthopedics;  Laterality: Right;  . IR IMAGING GUIDED PORT INSERTION  04/23/2020       Family History  Problem  Relation Age of Onset  . Stroke Mother   . Cancer Father        esophageal  . Cancer Paternal Grandmother 3       unknown  . Prostate cancer Neg Hx   . Breast cancer Neg Hx   . Colon cancer Neg Hx     Social History   Tobacco Use  . Smoking status: Former Smoker    Packs/day: 1.50    Years: 50.00    Pack years: 75.00    Types: Cigarettes    Quit date: 08/20/2011    Years since quitting: 8.7  . Smokeless tobacco: Never Used  Substance Use Topics  . Alcohol use: Yes    Comment: 1beer a day  . Drug use: No    Home Medications Prior to Admission medications   Medication Sig Start Date End Date Taking? Authorizing Provider  albuterol (PROAIR HFA) 108 (90 Base) MCG/ACT inhaler Inhale 2 puffs into the lungs every 4 (four) hours as needed for wheezing or shortness of breath. 03/14/18  Yes Collene Gobble, MD  amitriptyline (ELAVIL) 75 MG tablet Take 75 mg by mouth at bedtime.   Yes [provider]  cholecalciferol (VITAMIN D3) 25 MCG (1000 UT) tablet Take 1,000 Units by mouth daily.   Yes [provider]  Coenzyme Q10 (COQ10 PO) Take 1 tablet by mouth daily.   Yes [provider]  ibuprofen (ADVIL) 200 MG tablet Take 400 mg by mouth every 6 (six) hours as needed for moderate pain.   Yes [provider]  lisinopril (ZESTRIL) 20  MG tablet Take 1 tablet (20 mg total) by mouth daily. 02/19/20  Yes [name], MD  Multiple Vitamin (MULTIVITAMIN) tablet Take 1 tablet by mouth daily.   Yes [provider]  polyethylene glycol (MIRALAX / GLYCOLAX) 17 g packet Take 17 g by mouth daily. Patient taking differently: Take 17 g by mouth daily as needed for mild constipation.  10/29/19  Yes Domenic Polite, MD  prochlorperazine (COMPAZINE) 10 MG tablet Take 1 tablet (10 mg total) by mouth every 6 (six) hours as needed for nausea or vomiting. 04/16/20  Yes [name], MD  Tiotropium Bromide Monohydrate (SPIRIVA RESPIMAT) 2.5 MCG/ACT AERS Inhale 2  puffs into the lungs daily. 12/15/18  Yes Collene Gobble, MD  diazepam (VALIUM) 2 MG tablet Take one tab one hour prior to MRI and repeat once just prior to MRI if needed Patient not taking: Reported on 05/11/2020 04/09/20   Aundra Dubin, PA-C  enzalutamide Gillermina Phy) 40 MG capsule Take 4 capsules (160 mg total) by mouth daily. Patient not taking: Reported on 05/11/2020 03/27/20   [name], MD  HYDROmorphone (DILAUDID) 4 MG tablet Take 1 tablet (4 mg total) by mouth every 4 (four) hours as needed for severe pain. Patient not taking: Reported on 05/11/2020 05/07/20   [name], MD  oxyCODONE (OXY IR/ROXICODONE) 5 MG immediate release tablet Take 1-2 tablets (5-10 mg total) by mouth 2 (two) times daily as needed for severe pain. Patient not taking: Reported on 05/11/2020 04/09/20   [name], MD  rivaroxaban (XARELTO) 10 MG TABS tablet Take 1 tablet (10 mg total) by mouth daily. Patient not taking: Reported on 04/18/2020 11/08/19   Leandrew Koyanagi, MD    Allergies    Patient has no known allergies.  Review of Systems   Review of Systems Ten systems are reviewed and are negative for acute change except as noted in the HPI   Physical Exam Updated Vital Signs BP 129/77   Pulse (!) 105   Temp 97.6 F (36.4 C) (Oral)   Resp 17   Ht _0  (1.727 m)   Wt 64 kg   SpO2 98%   BMI 21.45 kg/m   Physical Exam Constitutional:      General: He is not in acute distress.    Appearance: Normal appearance. He is well-developed. He is not ill-appearing or diaphoretic.  HENT:     Head: Normocephalic and atraumatic.     Right Ear: External ear normal.     Left Ear: External ear normal.     Nose: Nose normal.  Eyes:     General: Vision grossly intact. Gaze aligned appropriately.     Pupils: Pupils are equal, round, and reactive to light.  Neck:     Trachea: Trachea and phonation normal. No tracheal deviation.  Cardiovascular:     Rate and Rhythm: Normal rate and regular rhythm.    Pulmonary:     Effort: Pulmonary effort is normal. No respiratory distress.  Abdominal:     General: Bowel sounds are decreased. There is no distension.     Palpations: Abdomen is soft.     Tenderness: There is generalized abdominal tenderness. There is no guarding or rebound.  Musculoskeletal:        General: Normal range of motion.     Cervical back: Normal range of motion.  Skin:    General: Skin is warm and dry.  Neurological:     Mental Status: He is alert.  GCS: GCS eye subscore is 4. GCS verbal subscore is 5. GCS motor subscore is 6.     Comments: Speech is clear and goal oriented, follows commands Major Cranial nerves without deficit, no facial droop Moves extremities without ataxia, coordination intact  Psychiatric:        Behavior: Behavior normal.     ED Results / Procedures / Treatments   Labs (all labs ordered are listed, but only abnormal results are displayed) Labs Reviewed  COMPREHENSIVE METABOLIC PANEL - Abnormal; Notable for the following components:      Result Value   Sodium 134 (*)    Chloride 97 (*)    CO2 21 (*)    Calcium 8.7 (*)    Total Protein 5.9 (*)    Albumin 2.7 (*)    AST 58 (*)    Alkaline Phosphatase 374 (*)    Total Bilirubin 1.5 (*)    Anion gap 16 (*)    All other components within normal limits  CBC - Abnormal; Notable for the following components:   RBC 3.83 (*)    MCV 102.3 (*)    All other components within normal limits  GASTROINTESTINAL PANEL BY PCR, STOOL (REPLACES STOOL CULTURE)  SARS CORONAVIRUS 2 BY RT PCR (HOSPITAL ORDER, [facility])  C DIFFICILE QUICK SCREEN W PCR REFLEX  LIPASE, BLOOD  URINALYSIS, ROUTINE W REFLEX MICROSCOPIC  LACTIC ACID, PLASMA  LACTIC ACID, PLASMA  TSH    EKG EKG Interpretation  Date/Time:  Sunday May 11 2020 06:12:29 EDT Ventricular Rate:  104 PR Interval:    QRS Duration: 89 QT Interval:  335 QTC Calculation: 441 R Axis:   74 Text  Interpretation: Sinus tachycardia Probable left atrial enlargement No significant change since last tracing Confirmed by Merrily Pew [phone]) on 05/11/2020 6:37:17 AM   Radiology CT ABDOMEN PELVIS W CONTRAST  Result Date: 05/11/2020 CLINICAL DATA:  Diffuse abdominal pain with constipation for the last week. Bowel obstruction suspected. History of prostate cancer with last chemotherapy treatment 2 weeks ago. EXAM: CT ABDOMEN AND PELVIS WITH CONTRAST TECHNIQUE: Multidetector CT imaging of the abdomen and pelvis was performed using the standard protocol following bolus administration of intravenous contrast. CONTRAST:  171m OMNIPAQUE IOHEXOL 300 MG/ML  SOLN COMPARISON:  CT abdomen dated 04/11/2020. FINDINGS: Lower chest: LEFT lower lobe pulmonary nodule is stable in the short-term interval, measuring 7 mm, increased from 3 mm on 02/28/2019. Hepatobiliary: Redemonstration of diffuse intrahepatic metastases. Gallbladder appears normal. No bile duct dilatation seen. Pancreas: Unremarkable. No pancreatic ductal dilatation or surrounding inflammatory changes. Spleen: Normal in size without focal abnormality. Adrenals/Urinary Tract: Adrenal glands appear normal. Bilateral renal cysts. Kidneys otherwise unremarkable without suspicious mass, stone or hydronephrosis. No ureteral or bladder calculi identified. Stomach/Bowel: Pronounced thickening of the walls of the rectosigmoid colon, with associated surrounding pericolonic inflammation/fluid stranding, new compared to the earlier CT of 04/11/2020. No dilated large or small bowel loops. Stomach is unremarkable w, decompressed. Appendix is normal. Vascular/Lymphatic: Aortic atherosclerosis. No enlarged lymph nodes seen. Reproductive: Heterogeneous appearance of the prostate gland, stable. Other: Small amount of free fluid in the pelvis. No abscess collection seen. No free intraperitoneal air. Musculoskeletal: Mixed sclerotic and lytic lesion within the LEFT acetabulum,  stable compared to the most recent CT abdomen and pelvis of 04/11/2020. Postoperative changes of the proximal RIGHT femur. Subtle lytic appearing lesion within the posterior portion of the L3 vertebral body, as also previously described. No new osseous abnormality. IMPRESSION: 1. Pronounced edematous  thickening of the walls of the rectosigmoid colon, with associated surrounding pericolonic inflammation/fluid stranding, new compared to the earlier CT of 04/11/2020, consistent with a new colitis of infectious, inflammatory or ischemic nature. 2. Redemonstration of diffuse intrahepatic metastases. 3. LEFT lower lobe pulmonary nodule is stable in the short-term interval, measuring 7 mm, increased from 3 mm on 02/28/2019. This is suspicious for a metastatic lesion. 4. Mixed sclerotic and lytic lesion within the LEFT acetabulum, stable compared to the most recent CT abdomen and pelvis of 04/11/2020, compatible with osseous metastasis. 5. Subtle lytic appearing lesion within the posterior portion of the L3 vertebral body, as also previously described, suspicious for additional osseous metastasis. 6. No bowel obstruction. Aortic Atherosclerosis (ICD10-I70.0). Electronically Signed   By: Franki Cabot M.D.   On: 05/11/2020 08:21    Procedures Procedures (including critical care time)  Medications Ordered in ED Medications  sodium chloride flush (NS) 0.9 % injection 3 mL (3 mLs Intravenous Not Given 05/11/20 0718)  sodium chloride (PF) 0.9 % injection (has no administration in time range)  ondansetron (ZOFRAN) injection 4 mg (4 mg Intravenous Given 05/11/20 0722)  iohexol (OMNIPAQUE) 300 MG/ML solution 100 mL (100 mLs Intravenous Contrast Given 05/11/20 0736)  sodium chloride 0.9 % bolus 500 mL (500 mLs Intravenous New Bag/Given 05/11/20 0840)  HYDROmorphone (DILAUDID) injection 1 mg (1 mg Intravenous Given 05/11/20 0946)    ED Course  I have reviewed the triage vital signs and the nursing notes.  Pertinent labs  & imaging results that were available during my care of the patient were reviewed by me and considered in my medical decision making (see chart for details).  Clinical Course as of May 12 951  Sun May 11, 2020  0915 Dr. Truman Hayward   [BM]    Clinical Course User Index [BM] Gari Crown   MDM Rules/Calculators/A&P                      Lab Tests: I ordered, reviewed and interpreted labs which include: CBC shows no leukocytosis to suggest infection, no evidence of anemia CMP shows no emergent electrolyte derangement, no evidence of acute kidney injury, mild elevation of alk phos and AST similar to prior, total bilirubin 1.5  Imaging Tests: I ordered imaging studies which include: CT abdomen pelvis:  IMPRESSION:  1. Pronounced edematous thickening of the walls of the rectosigmoid  colon, with associated surrounding pericolonic inflammation/fluid  stranding, new compared to the earlier CT of 04/11/2020, consistent  with a new colitis of infectious, inflammatory or ischemic nature.  2. Redemonstration of diffuse intrahepatic metastases.  3. LEFT lower lobe pulmonary nodule is stable in the short-term  interval, measuring 7 mm, increased from 3 mm on 02/28/2019. This is  suspicious for a metastatic lesion.  4. Mixed sclerotic and lytic lesion within the LEFT acetabulum,  stable compared to the most recent CT abdomen and pelvis of  04/11/2020, compatible with osseous metastasis.  5. Subtle lytic appearing lesion within the posterior portion of the  L3 vertebral body, as also previously described, suspicious for  additional osseous metastasis.  6. No bowel obstruction.    Aortic Atherosclerosis (ICD10-I70.0).   EKG: Sinus tachycardia Probable left atrial enlargement No significant change since last tracing Confirmed by Merrily Pew [phone]) on 05/11/2020 6:37:17 AM ---------  ED Course: Patient with no bowel movement for around 1 week reports nausea inability to tolerate p.o.  over the last 3 days.  Initially was concern for possible  obstruction however CT shows evidence of colitis.  Initial lab work is not suggestive of infection.  Will obtain stool studies for evaluation of infectious colitis however do not feel antibiotics are indicated at this time.  Initiation of antibiotics defer to hospitalist if needed.  Lactic was added.  Patient responded well to Zofran for nausea, he had initially refused pain medication as he was concerned for this worsening a possible obstruction.  After patient was updated on findings he then was agreeable to pain medication and 1 mg of Dilaudid was ordered.  On reevaluation he is resting comfortably no acute distress vital signs show mild tachycardia otherwise unremarkable.  He is agreeable for admission.  Patient seen and evaluated by Dr. Rex Kras who agrees with work-up and admission. - 9:15 AM: Discussed case with Dr. Truman Hayward, patient has been accepted to hospitalist service.  Lactic, stool studies, urinalysis and Covid test is pending.     Note: Portions of this report may have been transcribed using voice recognition software. Every effort was made to ensure accuracy; however, inadvertent computerized transcription errors may still be present. Final Clinical Impression(s) / ED Diagnoses Final diagnoses:  Colitis    Rx / DC Orders ED Discharge Orders    None       Gari Crown 05/11/20 [place], Wenda Overland, MD 05/11/20 1527

## 2020-05-12 DIAGNOSIS — C787 Secondary malignant neoplasm of liver and intrahepatic bile duct: Secondary | ICD-10-CM

## 2020-05-12 DIAGNOSIS — C61 Malignant neoplasm of prostate: Secondary | ICD-10-CM

## 2020-05-12 DIAGNOSIS — R7989 Other specified abnormal findings of blood chemistry: Secondary | ICD-10-CM | POA: Diagnosis not present

## 2020-05-12 DIAGNOSIS — E860 Dehydration: Secondary | ICD-10-CM | POA: Diagnosis not present

## 2020-05-12 DIAGNOSIS — J410 Simple chronic bronchitis: Secondary | ICD-10-CM | POA: Diagnosis not present

## 2020-05-12 DIAGNOSIS — Z87891 Personal history of nicotine dependence: Secondary | ICD-10-CM

## 2020-05-12 DIAGNOSIS — E7800 Pure hypercholesterolemia, unspecified: Secondary | ICD-10-CM

## 2020-05-12 DIAGNOSIS — I10 Essential (primary) hypertension: Secondary | ICD-10-CM

## 2020-05-12 DIAGNOSIS — C7951 Secondary malignant neoplasm of bone: Secondary | ICD-10-CM

## 2020-05-12 DIAGNOSIS — M79604 Pain in right leg: Secondary | ICD-10-CM

## 2020-05-12 DIAGNOSIS — K529 Noninfective gastroenteritis and colitis, unspecified: Secondary | ICD-10-CM | POA: Diagnosis not present

## 2020-05-12 DIAGNOSIS — E871 Hypo-osmolality and hyponatremia: Secondary | ICD-10-CM

## 2020-05-12 LAB — COMPREHENSIVE METABOLIC PANEL
ALT: 31 U/L (ref 0–44)
AST: 81 U/L — ABNORMAL HIGH (ref 15–41)
Albumin: 2.5 g/dL — ABNORMAL LOW (ref 3.5–5.0)
Alkaline Phosphatase: 483 U/L — ABNORMAL HIGH (ref 38–126)
Anion gap: 10 (ref 5–15)
BUN: 14 mg/dL (ref 8–23)
CO2: 26 mmol/L (ref 22–32)
Calcium: 8.4 mg/dL — ABNORMAL LOW (ref 8.9–10.3)
Chloride: 98 mmol/L (ref 98–111)
Creatinine, Ser: 0.73 mg/dL (ref 0.61–1.24)
GFR calc Af Amer: >60 mL/min (ref >60)
GFR calc non Af Amer: >60 mL/min (ref >60)
Glucose, Bld: 98 mg/dL (ref 70–99)
Potassium: 4.4 mmol/L (ref 3.5–5.1)
Sodium: 134 mmol/L — ABNORMAL LOW (ref 135–145)
Total Bilirubin: 1 mg/dL (ref 0.3–1.2)
Total Protein: 5.8 g/dL — ABNORMAL LOW (ref 6.5–8.1)

## 2020-05-12 LAB — CBC
HCT: 28.8 % — ABNORMAL LOW (ref 39.0–52.0)
Hemoglobin: 9.3 g/dL — ABNORMAL LOW (ref 13.0–17.0)
MCH: 33.7 pg (ref 26.0–34.0)
MCHC: 32.3 g/dL (ref 30.0–36.0)
MCV: 104.3 fL — ABNORMAL HIGH (ref 80.0–100.0)
Platelets: 536 10*3/uL — ABNORMAL HIGH (ref 150–400)
RBC: 2.76 MIL/uL — ABNORMAL LOW (ref 4.22–5.81)
RDW: 14.7 % (ref 11.5–15.5)
WBC: 11.3 10*3/uL — ABNORMAL HIGH (ref 4.0–10.5)
nRBC: 0 % (ref 0.0–0.2)

## 2020-05-12 MED ORDER — CEFDINIR 250 MG/5ML PO SUSR: 600.0000 mg | Freq: Two times a day (BID) | ORAL | Status: DC

## 2020-05-12 MED ORDER — CEFDINIR 300 MG PO CAPS
600.0000 mg | ORAL_CAPSULE | Freq: Two times a day (BID) | ORAL | Status: DC
  Administered 2020-05-12: 600 mg via ORAL
  Filled 2020-05-12 (×2): qty 2

## 2020-05-12 MED ORDER — ENSURE ENLIVE PO LIQD: 237.0000 mL | Freq: Two times a day (BID) | ORAL | 12 refills | Status: AC

## 2020-05-12 MED ORDER — METRONIDAZOLE 500 MG PO TABS
500.0000 mg | ORAL_TABLET | Freq: Three times a day (TID) | ORAL | 0 refills | Status: AC
Start: 2020-05-12 — End: 2020-05-17

## 2020-05-12 MED ORDER — HEPARIN SOD (PORK) LOCK FLUSH 100 UNIT/ML IV SOLN
500.0000 [IU] | INTRAVENOUS | Status: AC | PRN
  Administered 2020-05-12: 500 [IU]
  Filled 2020-05-12: qty 5

## 2020-05-12 MED ORDER — CEFDINIR 300 MG PO CAPS: 600.0000 mg | ORAL_CAPSULE | Freq: Two times a day (BID) | ORAL | 0 refills | Status: AC

## 2020-05-12 NOTE — Care Management Obs Status (Signed)
[facility] NOTIFICATION   Patient Details  Name: JAISHAUN MCNAB MRN: [id number] Date of Birth: March 05, 1951   Medicare Observation Status Notification Given:  Yes    Joaquin Courts, RN 05/12/2020, 3:52 PM

## 2020-05-12 NOTE — Progress Notes (Signed)
Discharge instructions given to pt and all questions were answered.  

## 2020-05-12 NOTE — Discharge Summary (Signed)
Physician Discharge Summary  Anthony Escobar JAS:[id number] DOB: 21-Jan-1951 DOA: 05/11/2020  PCP: [name], MD  Admit date: 05/11/2020 Discharge date: 05/12/2020 Consultations: None Admitted From: home  Disposition: home  Discharge Diagnoses:  Principal Problem:   Colitis, acute Active Problems:   Dehydration   Prostate cancer metastatic to bone ([place])   Elevated liver function tests   Liver metastasis ([facility])   Right leg pain   COPD (chronic obstructive pulmonary disease) ([place])   Hyponatremia   Hyperbilirubinemia   Essential hypertension   HLD (hyperlipidemia)   Former smoker   Hospital Course Summary: 69 y.o.malewith h/ometastatic prostate cancer with current chemotherapy as well as radiation therapy to left knee, chronic rt leg pain, HTN, HLD, COPD, former smoker who lives at home with his granddaughter, presented with nausea, non bloody diarrhea and mid abdominal pain. Associate symptoms included decreased appetite and generalized weakness. Denies recent use of antibiotics.  ED Course: Afebrile, HR109, RR 22, BP 134/84 and room air O2 sats 100%. Tenderness to palpation to epigastric area andRUQarea noted on PE.Labs showed WBC 9.8->13K, Hgb 8.7, sodium 134, CO2 21, anion gap 16, normal creatinine, ALP 334, normal lipase, AST 58, total bilirubin 1.5.CT abdomen suggested rectosigmoid colon colitis, diffuse intrahepatic meta stasis. Patient received normal saline 500 mL bolus x1, IV Dilaudid and IV Zofran at ED. Hospital course: Patient admitted to [facility] for diarrhea w/u , stool studies including C Diff sent. Lactate unremarkable 0.7-1.7.TSH 0.943. FOBT resulted positive although in the setting of acute colitis/diarrhea.  Acute rectosigmoid colitis: Stool C diff -ve. COVID screen negative.GI panel still pending but patient feels much better with last BM yesterday.Leucocytosis downtrending. Afevrile. Patient on FLD and felt ready for soft diet-tolerated well for lunch  and plan for discharge this evening (patient also anxious to leave). Currently on IV flagyl. Will add cephalosporins to change flagyl to PO upon discharge to complete 5 day course. Note sure if colitis related to radiation treatments although denies getting any abdominal/pelvic radiation. F/U primary GI Dr Ronalee Red and primary oncologist Dr Alen Blew on discharge (satff message sent to MD and updated oncology APP in person)  Dehydration with mild hyponatremia: in the setting of diarrhea, advanced malignancy. Renal function okay. Advance diet as tolerated and transition off fluids.   Abnormal LFTS: Patient has hyperbilirubinemia, likely due to cholestasis now improved on repeat labs. Chronically elevated ALP and AST noted in the setting of hepatic mets. CT/USG GB only showed sludge  Metastatic prostate cancer with liver/bone metastasis: Patient has chronic rt leg pain, followed by orthopedics.  Macrocytic anemia: FOBT positive and noted to be on xarelto 10mg  for ?DVT prophylaxis . Hgb on presentation at 13 (likely haem concentrated) -->8.7-->9.3 now. No signs of gross bleeding and hgb improved on labs today. Resume home meds (on xarelto 10mg  at home) and f/u primary GI upon discharge to repeat stool Guaiac once colitis resolves and to f/u primary Haem/oncologist to see if he needs to continue xarelto   HTN : Holding lisinopril for now given diarrhea and low normal BP. It appears that patient wasn't taking this med recently anyway.  COPD and a former smoker : stable, MDI/nebs prn.  Depression: resume Elavil  DVT prophylaxis: lovenox while here. On xarelto 10 mg at home (resumed on discharge)   Discharge Exam:  Vitals:   05/12/20 0816 05/12/20 1355  BP:  109/73  Pulse: 84 95  Resp: 18 17  Temp:  98.2 F (36.8 C)  SpO2: 94% 97%   Vitals:  05/12/20 0127 05/12/20 0645 05/12/20 0816 05/12/20 1355  BP: 108/70 109/81  109/73  Pulse: 94 93 84 95  Resp: 17 19 18 17   Temp: 97.9 F (36.6 C)  98.1 F (36.7 C)  98.2 F (36.8 C)  TempSrc: Oral Oral    SpO2: 96% 98% 94% 97%  Weight:      Height:        General: Pt is alert, awake, not in acute distress Cardiovascular: RRR, S1/S2 +, no rubs, no gallops Respiratory: CTA bilaterally, no wheezing, no rhonchi Abdominal: Soft, NT, ND, bowel sounds + Extremities: no edema, no cyanosis  Discharge Condition:Stable CODE STATUS: Full code Diet recommendation: heart healthy diet Recommendations for Outpatient Follow-up:  1. Follow up with PCP: 3-5 days 2. Follow up with consultants: Primary GI and primary oncologist in 1 week 3. Please obtain follow up labs including: FOBT, Hgb/HCt  Home Health services upon discharge: Home PT referral placed Equipment/Devices upon discharge: none   Discharge Instructions:  Discharge Instructions    Call MD for:   Complete by: As directed    Recurrent diarrhea   Call MD for:  difficulty breathing, headache or visual disturbances   Complete by: As directed    Call MD for:  extreme fatigue   Complete by: As directed    Call MD for:  persistant dizziness or light-headedness   Complete by: As directed    Call MD for:  persistant nausea and vomiting   Complete by: As directed    Call MD for:  severe uncontrolled pain   Complete by: As directed    Call MD for:  temperature >100.4   Complete by: As directed    Diet - low sodium heart healthy   Complete by: As directed    Face-to-face encounter (required for Medicare/Medicaid patients)   Complete by: As directed    I [place] Shi certify that this patient is under my care and that I, or a nurse practitioner or physician's assistant working with me, had a face-to-face encounter that meets the physician face-to-face encounter requirements with this patient on 05/12/2020. The encounter with the patient was in whole, or in part for the following medical condition(s) which is the primary reason for home health care (List medical condition):  Metastatic prostrate ca, rectosigmoid colitis, muscular weakness   The encounter with the patient was in whole, or in part, for the following medical condition, which is the primary reason for home health care: Metastatic prostrate ca, rectosigmoid colitis   I certify that, based on my findings, the following services are medically necessary home health services: Physical therapy   Reason for Medically Necessary Home Health Services:  Skilled Nursing- Change/Decline in Patient Status Therapy- Gait Training, Transfer Training and Stair Training     My clinical findings support the need for the above services:  Pain interferes with ambulation/mobility Unsafe ambulation due to balance issues     Further, I certify that my clinical findings support that this patient is homebound due to: Pain interferes with ambulation/mobility   Home Health   Complete by: As directed    To provide the following care/treatments: PT   Increase activity slowly   Complete by: As directed      Allergies as of 05/12/2020   No Known Allergies     Medication List    STOP taking these medications   diazepam 2 MG tablet Commonly known as: Valium   lisinopril 20 MG tablet Commonly known as: ZESTRIL   oxyCODONE  5 MG immediate release tablet Commonly known as: Oxy IR/ROXICODONE   polyethylene glycol 17 g packet Commonly known as: MIRALAX / GLYCOLAX     TAKE these medications   albuterol 108 (90 Base) MCG/ACT inhaler Commonly known as: ProAir HFA Inhale 2 puffs into the lungs every 4 (four) hours as needed for wheezing or shortness of breath.   amitriptyline 75 MG tablet Commonly known as: ELAVIL Take 75 mg by mouth at bedtime.   cefdinir 300 MG capsule Commonly known as: OMNICEF Take 2 capsules (600 mg total) by mouth every 12 (twelve) hours for 4 days.   cholecalciferol 25 MCG (1000 UNIT) tablet Commonly known as: VITAMIN D3 Take 1,000 Units by mouth daily.   COQ10 PO Take 1 tablet by mouth  daily.   feeding supplement (ENSURE ENLIVE) Liqd Take 237 mLs by mouth 2 (two) times daily between meals. Start taking on: May 13, 2020   HYDROmorphone 4 MG tablet Commonly known as: Dilaudid Take 1 tablet (4 mg total) by mouth every 4 (four) hours as needed for severe pain.   ibuprofen 200 MG tablet Commonly known as: ADVIL Take 400 mg by mouth every 6 (six) hours as needed for moderate pain.   metroNIDAZOLE 500 MG tablet Commonly known as: Flagyl Take 1 tablet (500 mg total) by mouth 3 (three) times daily for 5 days.   multivitamin tablet Take 1 tablet by mouth daily.   prochlorperazine 10 MG tablet Commonly known as: COMPAZINE Take 1 tablet (10 mg total) by mouth every 6 (six) hours as needed for nausea or vomiting.   rivaroxaban 10 MG Tabs tablet Commonly known as: Xarelto Take 1 tablet (10 mg total) by mouth daily.   Tiotropium Bromide Monohydrate 2.5 MCG/ACT Aers Commonly known as: Spiriva Respimat Inhale 2 puffs into the lungs daily.   Xtandi 40 MG capsule Generic drug: enzalutamide Take 4 capsules (160 mg total) by mouth daily.      Follow-up Information    [facility].   Specialty: Home Health Services Why: agency will provide home health physical therapy. Contact information: [place] [address] [phone]          No Known Allergies    The results of significant diagnostics from this hospitalization (including imaging, microbiology, ancillary and laboratory) are listed below for reference.    Labs: BNP (last 3 results) No results for input(s): BNP in the last 8760 hours. Basic Metabolic Panel: Recent Labs  Lab 05/11/20 0641 05/11/20 1305 05/12/20 0310  NA 134*  --  134*  K 4.2  --  4.4  CL 97*  --  98  CO2 21*  --  26  GLUCOSE 89  --  98  BUN 17  --  14  CREATININE 0.74 0.76 0.73  CALCIUM 8.7*  --  8.4*   Liver Function Tests: Recent Labs  Lab 05/11/20 0641 05/12/20 0310  AST 58* 81*   ALT 26 31  ALKPHOS 374* 483*  BILITOT 1.5* 1.0  PROT 5.9* 5.8*  ALBUMIN 2.7* 2.5*   Recent Labs  Lab 05/11/20 0641  LIPASE 16   No results for input(s): AMMONIA in the last 168 hours. CBC: Recent Labs  Lab 05/11/20 0641 05/11/20 1305 05/12/20 0310  WBC 9.8 13.8* 11.3*  HGB 13.0 8.7* 9.3*  HCT 39.2 27.0* 28.8*  MCV 102.3* 103.1* 104.3*  PLT 379 497* 536*   Cardiac Enzymes: No results for input(s): CKTOTAL, CKMB, CKMBINDEX, TROPONINI in the last 168  hours. BNP: Invalid input(s): POCBNP CBG: No results for input(s): GLUCAP in the last 168 hours. D-Dimer No results for input(s): DDIMER in the last 72 hours. Hgb A1c No results for input(s): HGBA1C in the last 72 hours. Lipid Profile No results for input(s): CHOL, HDL, LDLCALC, TRIG, CHOLHDL, LDLDIRECT in the last 72 hours. Thyroid function studies Recent Labs    05/11/20 1305  TSH 0.943   Anemia work up No results for input(s): VITAMINB12, FOLATE, FERRITIN, TIBC, IRON, RETICCTPCT in the last 72 hours. Urinalysis    Component Value Date/Time   COLORURINE YELLOW 10/19/2019 1609   APPEARANCEUR CLEAR 10/19/2019 1609   LABSPEC 1.011 10/19/2019 1609   PHURINE 5.0 10/19/2019 1609   GLUCOSEU NEGATIVE 10/19/2019 1609   HGBUR NEGATIVE 10/19/2019 1609   BILIRUBINUR NEGATIVE 10/19/2019 1609   KETONESUR NEGATIVE 10/19/2019 1609   PROTEINUR NEGATIVE 10/19/2019 1609   NITRITE NEGATIVE 10/19/2019 1609   LEUKOCYTESUR NEGATIVE 10/19/2019 1609   Sepsis Labs Invalid input(s): PROCALCITONIN,  WBC,  LACTICIDVEN Microbiology Recent Results (from the past 240 hour(s))  SARS Coronavirus 2 by RT PCR (hospital order, performed in [place] hospital lab) Nasopharyngeal Nasopharyngeal Swab     Status: None   Collection Time: 05/11/20  9:47 AM   Specimen: Nasopharyngeal Swab  Result Value Ref Range Status   SARS Coronavirus 2 NEGATIVE NEGATIVE Final    Comment: (NOTE) SARS-CoV-2 target nucleic acids are NOT DETECTED. The  SARS-CoV-2 RNA is generally detectable in upper and lower respiratory specimens during the acute phase of infection. The lowest concentration of SARS-CoV-2 viral copies this assay can detect is 250 copies / mL. A negative result does not preclude SARS-CoV-2 infection and should not be used as the sole basis for treatment or other patient management decisions.  A negative result may occur with improper specimen collection / handling, submission of specimen other than nasopharyngeal swab, presence of viral mutation(s) within the areas targeted by this assay, and inadequate number of viral copies (<250 copies / mL). A negative result must be combined with clinical observations, patient history, and epidemiological information. Fact Sheet for Patients:   StrictlyIdeas.no Fact Sheet for Healthcare Providers: BankingDealers.co.za This test is not yet approved or cleared  by the [place] FDA and has been authorized for detection and/or diagnosis of SARS-CoV-2 by FDA under an Emergency Use Authorization (EUA).  This EUA will remain in effect (meaning this test can be used) for the duration of the COVID-19 declaration under Section 564(b)(1) of the Act, 21 U.S.C. section 360bbb-3(b)(1), unless the authorization is terminated or revoked sooner. Performed at [facility], [place] [address] [place], [place] [address]   C Difficile Quick Screen w PCR reflex     Status: None   Collection Time: 05/11/20  9:51 AM   Specimen: Stool  Result Value Ref Range Status   C Diff antigen NEGATIVE NEGATIVE Final   C Diff toxin NEGATIVE NEGATIVE Final   C Diff interpretation No C. difficile detected.  Final    Comment: Performed at [facility], [place] [address], [place], [place] [address]    Procedures/Studies: CT ABDOMEN PELVIS W CONTRAST  Result Date: 05/11/2020 CLINICAL DATA:  Diffuse abdominal pain with constipation  for the last week. Bowel obstruction suspected. History of prostate cancer with last chemotherapy treatment 2 weeks ago. EXAM: CT ABDOMEN AND PELVIS WITH CONTRAST TECHNIQUE: Multidetector CT imaging of the abdomen and pelvis was performed using the standard protocol following bolus administration of intravenous contrast. CONTRAST:  121mL OMNIPAQUE IOHEXOL 300 MG/ML  SOLN COMPARISON:  CT abdomen dated 04/11/2020. FINDINGS: Lower chest: LEFT lower lobe pulmonary nodule is stable in the short-term interval, measuring 7 mm, increased from 3 mm on 02/28/2019. Hepatobiliary: Redemonstration of diffuse intrahepatic metastases. Gallbladder appears normal. No bile duct dilatation seen. Pancreas: Unremarkable. No pancreatic ductal dilatation or surrounding inflammatory changes. Spleen: Normal in size without focal abnormality. Adrenals/Urinary Tract: Adrenal glands appear normal. Bilateral renal cysts. Kidneys otherwise unremarkable without suspicious mass, stone or hydronephrosis. No ureteral or bladder calculi identified. Stomach/Bowel: Pronounced thickening of the walls of the rectosigmoid colon, with associated surrounding pericolonic inflammation/fluid stranding, new compared to the earlier CT of 04/11/2020. No dilated large or small bowel loops. Stomach is unremarkable w, decompressed. Appendix is normal. Vascular/Lymphatic: Aortic atherosclerosis. No enlarged lymph nodes seen. Reproductive: Heterogeneous appearance of the prostate gland, stable. Other: Small amount of free fluid in the pelvis. No abscess collection seen. No free intraperitoneal air. Musculoskeletal: Mixed sclerotic and lytic lesion within the LEFT acetabulum, stable compared to the most recent CT abdomen and pelvis of 04/11/2020. Postoperative changes of the proximal RIGHT femur. Subtle lytic appearing lesion within the posterior portion of the L3 vertebral body, as also previously described. No new osseous abnormality. IMPRESSION: 1. Pronounced  edematous thickening of the walls of the rectosigmoid colon, with associated surrounding pericolonic inflammation/fluid stranding, new compared to the earlier CT of 04/11/2020, consistent with a new colitis of infectious, inflammatory or ischemic nature. 2. Redemonstration of diffuse intrahepatic metastases. 3. LEFT lower lobe pulmonary nodule is stable in the short-term interval, measuring 7 mm, increased from 3 mm on 02/28/2019. This is suspicious for a metastatic lesion. 4. Mixed sclerotic and lytic lesion within the LEFT acetabulum, stable compared to the most recent CT abdomen and pelvis of 04/11/2020, compatible with osseous metastasis. 5. Subtle lytic appearing lesion within the posterior portion of the L3 vertebral body, as also previously described, suspicious for additional osseous metastasis. 6. No bowel obstruction. Aortic Atherosclerosis (ICD10-I70.0). Electronically Signed   By: Franki Cabot M.D.   On: 05/11/2020 08:21   MR TIBIA FIBULA LEFT W WO CONTRAST  Result Date: 04/13/2020 CLINICAL DATA:  Lower leg pain, sudden onset of mass distal to the patella for 1 month. Painful with bending of the knee. EXAM: MRI OF LOWER LEFT EXTREMITY WITHOUT AND WITH CONTRAST TECHNIQUE: Multiplanar, multisequence MR imaging of the left tibia/fibula was performed both before and after administration of intravenous contrast. CONTRAST:  60mL GADAVIST GADOBUTROL 1 MMOL/ML IV SOLN COMPARISON:  Radiographs of 04/08/2020 and bone scan of 04/11/2020 FINDINGS: Bones/Joint/Cartilage 5.2 by 3.2 by 2.9 cm aggressive appearing mass of the left proximal tibia with surrounding marrow edema, low T1 and accentuated T2 signal, and moderate enhancement. There is cortical destruction anteriorly along the greater tuberosity, and the patellar tendon appears to partially attached to this mass and partially attached to the rim of bone just above this mass on image 23/9, which may undermined the biomechanical resiliency of the distal  attachment site. In addition to bony expansion, there is likely some localized extra osseous extension of tumor anteriorly and medially for example on image 20/10. This lesion corresponds to the accentuated activity in the left proximal tibia shown on recent bone scan. In the context of the patient's prostate cancer this is highly likely to be a metastatic lesion. There geodes along the right and left tibial spine. No other regional metastatic lesions are identified. Today's exam uses large regions of interest and does not well suited  to assessing the menisci. That said, there are some findings suspicious for radial tear of the midbody medial meniscus on the left, and possible oblique tear of the posterior horn medial meniscus on the right. False positive rate is significantly higher than on a dedicated knee MRI. Ligaments N/A Muscles and Tendons Low-grade edema in the tibialis anterior muscle medially is likely reactive. Soft tissues Likely extraosseous extension of tumor is noted above, otherwise unremarkable. IMPRESSION: 1. 5.2 by 3.2 by 2.9 cm aggressive appearing mass of the left proximal tibia with suspected extraosseous extension of tumor anteriorly and medially. This is highly likely to represent a metastatic lesion given the patient's history of prostate cancer. 2. The patellar tendon distal attachment is partly along the mass and partly along the rim of bone just above the mass, which may undermine the biomechanical resiliency of the distal attachment. 3. Suspected radial tears of the medial meniscus on the left side, and possible oblique tear of the posterior horn medial meniscus. However, the large field of view on today's exam leads one to reasonably expect a higher false positive rate than on a dedicated knee MRI. 4. Low-grade edema in the tibialis anterior muscle medially, likely reactive. Electronically Signed   By: Van Clines M.D.   On: 04/13/2020 10:26   XR FEMUR, MIN 2 VIEWS  RIGHT  Result Date: 05/08/2020 Stable IM fixation.  Basicervical femoral neck fracture.  There is tumor throughout the proximal femur.  No hardware complications.  Difficult to assess fracture healing based on x-rays today  XR Tibia/Fibula Right  Result Date: 05/08/2020 Moderate osteoarthritis of the right knee.  No acute abnormalities.  IR IMAGING GUIDED PORT INSERTION  Result Date: 04/23/2020 INDICATION: 69 year old male with metastatic prostate cancer in need of durable venous access for chemotherapy. EXAM: IMPLANTED PORT A CATH PLACEMENT WITH ULTRASOUND AND FLUOROSCOPIC GUIDANCE MEDICATIONS: 2 g Ancef; The antibiotic was administered within an appropriate time interval prior to skin puncture. ANESTHESIA/SEDATION: Versed 1.5 mg IV; Fentanyl 75 mcg IV; Moderate Sedation Time:  21 minutes The patient was continuously monitored during the procedure by the interventional radiology nurse under my direct supervision. FLUOROSCOPY TIME:  0 minutes, 12 seconds (2 mGy) COMPLICATIONS: None immediate. PROCEDURE: The right neck and chest was prepped with chlorhexidine, and draped in the usual sterile fashion using maximum barrier technique (cap and mask, sterile gown, sterile gloves, large sterile sheet, hand hygiene and cutaneous antiseptic). Local anesthesia was attained by infiltration with 1% lidocaine with epinephrine. Ultrasound demonstrated patency of the right internal jugular vein, and this was documented with an image. Under real-time ultrasound guidance, this vein was accessed with a 21 gauge micropuncture needle and image documentation was performed. A small dermatotomy was made at the access site with an 11 scalpel. A 0.018" wire was advanced into the SVC and the access needle exchanged for a 74F micropuncture vascular sheath. The 0.018" wire was then removed and a 0.035" wire advanced into the IVC. An appropriate location for the subcutaneous reservoir was selected below the clavicle and an incision was  made through the skin and underlying soft tissues. The subcutaneous tissues were then dissected using a combination of blunt and sharp surgical technique and a pocket was formed. A single lumen power injectable portacatheter was then tunneled through the subcutaneous tissues from the pocket to the dermatotomy and the port reservoir placed within the subcutaneous pocket. The venous access site was then serially dilated and a peel away vascular sheath placed over the wire. The wire was  removed and the port catheter advanced into position under fluoroscopic guidance. The catheter tip is positioned in the superior cavoatrial junction. This was documented with a spot image. The portacatheter was then tested and found to flush and aspirate well. The port was flushed with saline followed by 100 units/mL heparinized saline. The pocket was then closed in two layers using first subdermal inverted interrupted absorbable sutures followed by a running subcuticular suture. The epidermis was then sealed with Dermabond. The dermatotomy at the venous access site was also closed with Dermabond. IMPRESSION: Successful placement of a right IJ approach low-profile power Port with ultrasound and fluoroscopic guidance. The catheter is ready for use. Electronically Signed   By: Jacqulynn Cadet M.D.   On: 04/23/2020 14:11   VAS [place] LOWER EXTREMITY VENOUS (DVT)  Result Date: 05/08/2020  Lower Venous DVTStudy Indications: Swelling.  Comparison Study: no prior Performing Technologist: Abram Sander RVS  Examination Guidelines: A complete evaluation includes B-mode imaging, spectral Doppler, color Doppler, and power Doppler as needed of all accessible portions of each vessel. Bilateral testing is considered an integral part of a complete examination. Limited examinations for reoccurring indications may be performed as noted. The reflux portion of the exam is performed with the patient in reverse Trendelenburg.   +---------+---------------+---------+-----------+----------+--------------+ RIGHT    CompressibilityPhasicitySpontaneityPropertiesThrombus Aging +---------+---------------+---------+-----------+----------+--------------+ CFV      Full           Yes      Yes                                 +---------+---------------+---------+-----------+----------+--------------+ SFJ      Full                                                        +---------+---------------+---------+-----------+----------+--------------+ FV Prox  Full                                                        +---------+---------------+---------+-----------+----------+--------------+ FV Mid   Full                                                        +---------+---------------+---------+-----------+----------+--------------+ FV DistalFull                                                        +---------+---------------+---------+-----------+----------+--------------+ PFV      Full                                                        +---------+---------------+---------+-----------+----------+--------------+ POP      Full  Yes      Yes                                 +---------+---------------+---------+-----------+----------+--------------+ PTV      Full                                                        +---------+---------------+---------+-----------+----------+--------------+ PERO     Full                                                        +---------+---------------+---------+-----------+----------+--------------+   +---------+---------------+---------+-----------+----------+--------------+ LEFT     CompressibilityPhasicitySpontaneityPropertiesThrombus Aging +---------+---------------+---------+-----------+----------+--------------+ CFV      Full           Yes      Yes                                  +---------+---------------+---------+-----------+----------+--------------+ SFJ      Full                                                        +---------+---------------+---------+-----------+----------+--------------+ FV Prox  Full                                                        +---------+---------------+---------+-----------+----------+--------------+ FV Mid   Full                                                        +---------+---------------+---------+-----------+----------+--------------+ FV DistalFull                                                        +---------+---------------+---------+-----------+----------+--------------+ PFV      Full                                                        +---------+---------------+---------+-----------+----------+--------------+ POP      Full           Yes      Yes                                 +---------+---------------+---------+-----------+----------+--------------+ PTV  Full                                                        +---------+---------------+---------+-----------+----------+--------------+ PERO     Full                                                        +---------+---------------+---------+-----------+----------+--------------+     Summary: BILATERAL: - No evidence of deep vein thrombosis seen in the lower extremities, bilaterally. -   *See table(s) above for measurements and observations. Electronically signed by Servando Snare MD on 05/08/2020 at 5:40:09 PM.    Final    US Abdomen Limited RUQ  Result Date: 05/11/2020 CLINICAL DATA:  Abdominal pain, history of hepatic metastatic disease. EXAM: ULTRASOUND ABDOMEN LIMITED RIGHT UPPER QUADRANT COMPARISON:  CT from earlier in the same day. FINDINGS: Gallbladder: Gallbladder is well distended. No gallstones are seen. Mild gallbladder sludge is noted. No wall thickening or pericholecystic fluid is noted. Common bile duct:  Diameter: 3.1 mm. Liver: Diffusely heterogeneous consistent with the known history of hepatic metastatic disease. This is stable from the prior CT examination from earlier in the same day. Portal vein is patent on color Doppler imaging with normal direction of blood flow towards the liver. Other: None. IMPRESSION: Diffuse hepatic metastatic disease is noted. Gallbladder sludge. No other focal abnormality is noted. Electronically Signed   By: Inez Catalina M.D.   On: 05/11/2020 10:46    Time coordinating discharge: Over 30 minutes  SIGNED:   Guilford Shi, MD  [facility] 05/12/2020, 3:43 PM

## 2020-05-12 NOTE — TOC Initial Note (Signed)
Transition of Care [facility]) - Initial/Assessment Note    Patient Details  Name: Anthony Escobar MRN: [id number] Date of Birth: [birth year]  Transition of Care (TOC) CM/SW Contact:    Anthony Courts, RN Phone Number: 05/12/2020, 12:05 PM  Clinical Narrative:     CM spoke with patient and discussed PT recommendations for HHPT.  Patient is in agreement and requests CM also speak with his granddaughter Anthony Escobar.  CM spoke with Anthony Escobar who states he has previously used services with [facility] and would like them again, if they are unable Anthony Escobar is agreeable to another agency in network with his insurance plan.  CM spoke with [facility] rep, who states patient has not been with them before and unfortunately they do not have staffing to accept at this time, records do show that patient has used [facility] in the past. [facility] rep contacted and states they are unable to accept due to staffing.   [facility] with [facility] for HHPT.                  Expected Discharge Plan: [place] Barriers to Discharge: Continued Medical Work up   Patient Goals and CMS Choice Patient states their goals for this hospitalization and ongoing recovery are:: to go home with therapy CMS Medicare.gov Compare Post Acute Care list provided to:: Patient Choice offered to / list presented to : Patient(and granddaughter)  Expected Discharge Plan and Services Expected Discharge Plan: [place]   Discharge Planning Services: CM Consult Post Acute Care Choice: [place] arrangements for the past 2 months: Single Family Home                           HH Arranged: PT [place]: [facility] Date [place]: 05/12/20 Time [place]: 1205 Representative spoke with at [place]: [place] Arrangements/Services Living arrangements for the past 2 months: [place]   Patient language and need for interpreter reviewed:: Yes Do you  feel safe going back to the place where you live?: Yes      Need for Family Participation in Patient Care: Yes (Comment) Care giver support system in place?: Yes (comment)   Criminal Activity/Legal Involvement Pertinent to Current Situation/Hospitalization: No - Comment as needed  Activities of Daily Living Home Assistive Devices/Equipment: Walker (specify type), Crutches ADL Screening (condition at time of admission) Patient's cognitive ability adequate to safely complete daily activities?: Yes Is the patient deaf or have difficulty hearing?: No Does the patient have difficulty seeing, even when wearing glasses/contacts?: No Does the patient have difficulty concentrating, remembering, or making decisions?: No Patient able to express need for assistance with ADLs?: Yes Does the patient have difficulty dressing or bathing?: No Independently performs ADLs?: Yes (appropriate for developmental age) Does the patient have difficulty walking or climbing stairs?: Yes Weakness of Legs: Both Weakness of Arms/Hands: None  Permission Sought/Granted                  Emotional Assessment Appearance:: Appears stated age Attitude/Demeanor/Rapport: Engaged Affect (typically observed): Accepting Orientation: : Oriented to Self, Oriented to Place, Oriented to  Time, Oriented to Situation   Psych Involvement: No (comment)  Admission diagnosis:  Colitis [K52.9] Colitis, acute [K52.9] Abdominal pain [R10.9] Patient Active Problem List   Diagnosis Date Noted  . Colitis, acute 05/11/2020  . Dehydration 05/11/2020  . Hyperbilirubinemia 05/11/2020  . Elevated  liver function tests 05/11/2020  . Liver metastasis ([place]) 05/11/2020  . Right leg pain 05/11/2020  . Essential hypertension 05/11/2020  . HLD (hyperlipidemia) 05/11/2020  . Former smoker 05/11/2020  . Prostate cancer metastatic to bone ([place]) 11/09/2019  . Hyperkalemia 10/19/2019  . Hyponatremia 10/19/2019  . Pathologic  intertrochanteric fracture, right, initial encounter ([place]) 10/17/2019  . Acute bronchitis 12/15/2018  . Goals of care, counseling/discussion 04/27/2018  . Prostate cancer ([place]) 01/19/2018  . Multiple pulmonary nodules 10/28/2017  . COPD (chronic obstructive pulmonary disease) ([place]) 10/18/2017  . Tobacco use 10/18/2017   PCP:  Anthony Bill, MD Pharmacy:   [facility] [phone] [place], [place] [address] [place] [address] Phone: [phone] Fax: [place], [place] [address] [address] Phone: [phone] Fax: [phone]  [place], [place] [address] [place] [address] Phone: [phone] Fax: [phone]     Social Determinants of Health ([place]) Interventions    Readmission Risk Interventions No flowsheet data found.

## 2020-05-13 LAB — GI PATHOGEN PANEL BY PCR, STOOL

## 2020-05-15 ENCOUNTER — Inpatient Hospital Stay: Payer: Medicare Other

## 2020-05-15 ENCOUNTER — Telehealth: Payer: Self-pay | Admitting: Oncology

## 2020-05-15 ENCOUNTER — Encounter: Payer: Self-pay | Admitting: Oncology

## 2020-05-15 ENCOUNTER — Other Ambulatory Visit: Payer: Self-pay

## 2020-05-15 ENCOUNTER — Inpatient Hospital Stay: Payer: Medicare Other | Admitting: Oncology

## 2020-05-15 VITALS — BP 120/69 | HR 104 | Temp 97.5°F | Resp 18

## 2020-05-15 VITALS — HR 100 | Wt 148.4 lb

## 2020-05-15 DIAGNOSIS — Z5111 Encounter for antineoplastic chemotherapy: Secondary | ICD-10-CM | POA: Diagnosis not present

## 2020-05-15 DIAGNOSIS — C61 Malignant neoplasm of prostate: Secondary | ICD-10-CM

## 2020-05-15 DIAGNOSIS — Z95828 Presence of other vascular implants and grafts: Secondary | ICD-10-CM

## 2020-05-15 LAB — CMP (CANCER CENTER ONLY)
ALT: 29 U/L (ref 0–44)
AST: 69 U/L — ABNORMAL HIGH (ref 15–41)
Albumin: 2.5 g/dL — ABNORMAL LOW (ref 3.5–5.0)
Alkaline Phosphatase: 654 U/L — ABNORMAL HIGH (ref 38–126)
Anion gap: 11 (ref 5–15)
BUN: 15 mg/dL (ref 8–23)
CO2: 23 mmol/L (ref 22–32)
Calcium: 8.6 mg/dL — ABNORMAL LOW (ref 8.9–10.3)
Chloride: 94 mmol/L — ABNORMAL LOW (ref 98–111)
Creatinine: 1.05 mg/dL (ref 0.61–1.24)
GFR, Est AFR Am: >60 mL/min (ref >60)
GFR, Estimated: >60 mL/min (ref >60)
Glucose, Bld: 105 mg/dL — ABNORMAL HIGH (ref 70–99)
Potassium: 4.5 mmol/L (ref 3.5–5.1)
Sodium: 128 mmol/L — ABNORMAL LOW (ref 135–145)
Total Bilirubin: 0.8 mg/dL (ref 0.3–1.2)
Total Protein: 6.1 g/dL — ABNORMAL LOW (ref 6.5–8.1)

## 2020-05-15 LAB — CBC WITH DIFFERENTIAL (CANCER CENTER ONLY)
Abs Immature Granulocytes: 0.19 10*3/uL — ABNORMAL HIGH (ref 0.00–0.07)
Basophils Absolute: 0 10*3/uL (ref 0.0–0.1)
Basophils Relative: 0 %
Eosinophils Absolute: 0 10*3/uL (ref 0.0–0.5)
Eosinophils Relative: 0 %
HCT: 27.2 % — ABNORMAL LOW (ref 39.0–52.0)
Hemoglobin: 9.1 g/dL — ABNORMAL LOW (ref 13.0–17.0)
Immature Granulocytes: 1 %
Lymphocytes Relative: 1 %
Lymphs Abs: 0.2 10*3/uL — ABNORMAL LOW (ref 0.7–4.0)
MCH: 33.7 pg (ref 26.0–34.0)
MCHC: 33.5 g/dL (ref 30.0–36.0)
MCV: 100.7 fL — ABNORMAL HIGH (ref 80.0–100.0)
Monocytes Absolute: 1 10*3/uL (ref 0.1–1.0)
Monocytes Relative: 7 %
Neutro Abs: 13 10*3/uL — ABNORMAL HIGH (ref 1.7–7.7)
Neutrophils Relative %: 91 %
Platelet Count: 377 10*3/uL (ref 150–400)
RBC: 2.7 MIL/uL — ABNORMAL LOW (ref 4.22–5.81)
RDW: 14.2 % (ref 11.5–15.5)
WBC Count: 14.4 10*3/uL — ABNORMAL HIGH (ref 4.0–10.5)
nRBC: 0 % (ref 0.0–0.2)

## 2020-05-15 MED ORDER — FAMOTIDINE IN NACL 20-0.9 MG/50ML-% IV SOLN
20.0000 mg | Freq: Once | INTRAVENOUS | Status: AC
  Administered 2020-05-15: 20 mg via INTRAVENOUS

## 2020-05-15 MED ORDER — SODIUM CHLORIDE 0.9 % IV SOLN
20.0000 mg/m2 | Freq: Once | INTRAVENOUS | Status: AC
  Administered 2020-05-15: 36 mg via INTRAVENOUS
  Filled 2020-05-15: qty 3.6

## 2020-05-15 MED ORDER — SODIUM CHLORIDE 0.9 % IV SOLN
Freq: Once | INTRAVENOUS | Status: AC
  Filled 2020-05-15: qty 250

## 2020-05-15 MED ORDER — FAMOTIDINE IN NACL 20-0.9 MG/50ML-% IV SOLN
INTRAVENOUS | Status: AC
  Filled 2020-05-15: qty 50

## 2020-05-15 MED ORDER — SODIUM CHLORIDE 0.9% FLUSH
10.0000 mL | INTRAVENOUS | Status: DC | PRN
  Administered 2020-05-15: 10 mL via INTRAVENOUS
  Filled 2020-05-15: qty 10

## 2020-05-15 MED ORDER — SODIUM CHLORIDE 0.9% FLUSH
10.0000 mL | INTRAVENOUS | Status: DC | PRN
  Administered 2020-05-15: 10 mL
  Filled 2020-05-15: qty 10

## 2020-05-15 MED ORDER — MORPHINE SULFATE ER 30 MG PO TBCR: 30.0000 mg | EXTENDED_RELEASE_TABLET | Freq: Two times a day (BID) | ORAL | 0 refills | Status: DC

## 2020-05-15 MED ORDER — SODIUM CHLORIDE 0.9 % IV SOLN
10.0000 mg | Freq: Once | INTRAVENOUS | Status: AC
  Administered 2020-05-15: 10 mg via INTRAVENOUS
  Filled 2020-05-15: qty 10

## 2020-05-15 MED ORDER — HEPARIN SOD (PORK) LOCK FLUSH 100 UNIT/ML IV SOLN
500.0000 [IU] | Freq: Once | INTRAVENOUS | Status: AC | PRN
  Administered 2020-05-15: 500 [IU]
  Filled 2020-05-15: qty 5

## 2020-05-15 MED ORDER — DIPHENHYDRAMINE HCL 50 MG/ML IJ SOLN
INTRAMUSCULAR | Status: AC
  Filled 2020-05-15: qty 1

## 2020-05-15 MED ORDER — DIPHENHYDRAMINE HCL 50 MG/ML IJ SOLN
25.0000 mg | Freq: Once | INTRAMUSCULAR | Status: AC
  Administered 2020-05-15: 25 mg via INTRAVENOUS

## 2020-05-15 NOTE — Telephone Encounter (Signed)
Scheduled appt per 5/20 los.

## 2020-05-15 NOTE — Progress Notes (Signed)
Hematology and Oncology Follow Up Visit  Anthony Escobar [id number] 03-15-1951 69 y.o. 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  He presented with advanced disease at the time of diagnosis and PSA of 19.57 and Gleason score 4+4 = 8.    Prior Therapy:  He is status post prostate biopsy on December 29, 2017 showed a Gleason score 4+4 = 8 and 5 cores.   Taxotere chemotherapy with the first cycle of chemotherapy given on February 02, 2018.  He completed 6 cycles of therapy on May 18, 2018.  He is status post treatment of a intertrochanteric fracture and intramedullary implant completed on October 22, 2019 after sustaining a pathological fracture of the right hip.  He is status post right hip radiation for a total of 30 Gy in 10 fractions completed on November 27, 2019.  Xtandi 160 mg daily started in March of 2020.  Therapy discontinued in April 2021 for progression of disease.  He is status post radiation therapy to the left tibia as well as the left proximal femur and acetabulum completed in May 2021.  Current therapy:  Androgen deprivation therapy.  He received Eligard in December 2020 and will be repeated in April 2021.  Jevtana chemotherapy started on April 24, 2020.  He is here for cycle 2 of therapy.  Interim History: Anthony Escobar presents today for a repeat evaluation.  Since the last visit, he had received first cycle of chemotherapy as well as completed radiation to the left leg and hip with few complications.  He did require hospitalization for colitis and diarrhea.  Since his discharge, he reports overall feeling reasonably well when he is not moving but he is having difficulties with pain when he ambulates or tries to move.  His pain is rather diffuse but predominantly in the right hip. He denies any nausea,  vomiting or abdominal pain.  He denies any excessive fatigue or tiredness.  He denies any shortness of breath or difficulty breathing.   Medications: Reviewed without changes. Current Outpatient Medications  Medication Sig Dispense Refill  . albuterol (PROAIR HFA) 108 (90 Base) MCG/ACT inhaler Inhale 2 puffs into the lungs every 4 (four) hours as needed for wheezing or shortness of breath. 1 Inhaler 5  . amitriptyline (ELAVIL) 75 MG tablet Take 75 mg by mouth at bedtime.    . cefdinir (OMNICEF) 300 MG capsule Take 2 capsules (600 mg total) by mouth every 12 (twelve) hours for 4 days. 16 capsule 0  . cholecalciferol (VITAMIN D3) 25 MCG (1000 UT) tablet Take 1,000 Units by mouth daily.    . Coenzyme Q10 (COQ10 PO) Take 1 tablet by mouth daily.    . enzalutamide (XTANDI) 40 MG capsule Take 4 capsules (160 mg total) by mouth daily. (Patient not taking: Reported on 05/11/2020) 120 capsule 2  . feeding supplement, ENSURE ENLIVE, (ENSURE ENLIVE) LIQD Take 237 mLs by mouth 2 (two) times daily between meals. 237 mL 12  . HYDROmorphone (DILAUDID) 4 MG tablet Take 1 tablet (4 mg total) by mouth every 4 (four) hours as needed for severe pain. 60 tablet 0  . ibuprofen (ADVIL) 200 MG tablet Take 400 mg by mouth every 6 (six) hours as needed for moderate pain.    . metroNIDAZOLE (FLAGYL) 500 MG tablet Take 1 tablet (500 mg total) by mouth 3 (three) times daily for 5  days. 15 tablet 0  . Multiple Vitamin (MULTIVITAMIN) tablet Take 1 tablet by mouth daily.    . prochlorperazine (COMPAZINE) 10 MG tablet Take 1 tablet (10 mg total) by mouth every 6 (six) hours as needed for nausea or vomiting. 30 tablet 0  . rivaroxaban (XARELTO) 10 MG TABS tablet Take 1 tablet (10 mg total) by mouth daily. (Patient not taking: Reported on 04/18/2020) 30 tablet 0  . Tiotropium Bromide Monohydrate (SPIRIVA RESPIMAT) 2.5 MCG/ACT AERS Inhale 2 puffs into the lungs daily. 2 Inhaler 0   No current facility-administered medications for  this visit.   Facility-Administered Medications Ordered in Other Visits  Medication Dose Route Frequency Provider Last Rate Last Admin  . sodium chloride flush (NS) 0.9 % injection 10 mL  10 mL Intravenous PRN [name], MD   10 mL at 05/15/20 0839     Allergies: No Known Allergies    Physical Exam:    Blood pressure 120/69, pulse (!) 104, temperature (!) 97.5 F (36.4 C), temperature source Temporal, resp. rate 18, SpO2 96 %.     ECOG: 1   General appearance: Alert, awake without any distress. Head: Atraumatic without abnormalities Oropharynx: Without any thrush or ulcers. Eyes: No scleral icterus. Lymph nodes: No lymphadenopathy noted in the cervical, supraclavicular, or axillary nodes Heart:regular rate and rhythm, without any murmurs or gallops.    Left leg and knee edema noted. Lung: Clear to auscultation without any rhonchi, wheezes or dullness to percussion. Abdomin: Soft, nontender without any shifting dullness or ascites. Musculoskeletal: No clubbing or cyanosis.  Limited range of motion in his right hip left knee. Neurological: No motor or sensory deficits. Skin: No rashes or lesions.              Lab Results: Lab Results  Component Value Date   WBC 11.3 (H) 05/12/2020   HGB 9.3 (L) 05/12/2020   HCT 28.8 (L) 05/12/2020   MCV 104.3 (H) 05/12/2020   PLT 536 (H) 05/12/2020     Chemistry      Component Value Date/Time   NA 134 (L) 05/12/2020 0310   K 4.4 05/12/2020 0310   CL 98 05/12/2020 0310   CO2 26 05/12/2020 0310   BUN 14 05/12/2020 0310   CREATININE 0.73 05/12/2020 0310   CREATININE 1.14 04/24/2020 0833   CREATININE 0.88 05/23/2013 1422      Component Value Date/Time   CALCIUM 8.4 (L) 05/12/2020 0310   ALKPHOS 483 (H) 05/12/2020 0310   AST 81 (H) 05/12/2020 0310   AST 79 (H) 04/24/2020 0833   ALT 31 05/12/2020 0310   ALT 33 04/24/2020 0833   BILITOT 1.0 05/12/2020 0310   BILITOT 0.7 04/24/2020 0833       Impression  and Plan:  69 year old man with:   1.    Castration-resistant prostate cancer with disease to the bone and hepatic involvement documented in April 2021.    He is currently receiving systemic chemotherapy utilizing Jevtana without any major complications.  Risks and benefits of continuing this treatment were reviewed.  Problems such as nausea, vomiting, myelosuppression and neutropenia were reiterated.  He is agreeable to proceed and we will continue to monitor his PSA on treatment.    2.    Bone pain: Related to prostate cancer metastasis to the bone.  He is currently on Dilaudid which she uses every 4 hours with some relief.  I recommended proceeding with a long-acting pain medication including morphine.  We will start on 30  mg twice a day and uses Dilaudid as breakthrough.  Complication associated with this medication including constipation and somnolence.   4.  Androgen depravation: I recommended continuing Eligard which will be given every 4 months.  Next injection in August 2021.  5.  Prognosis: Therapy remains palliative at this time although he is still desiring aggressive measures.  He understands that he does not improve clinically transitioning to hospice would be a possibility.  6.  Bone directed therapy: He is currently on calcium and vitamin D supplements.  Will consider adding Xgeva or Zometa after dental clearance.  7.  Left tibial mass: Status post radiation therapy with improvement slowly in his pain.  8.  IV access: Port-A-Cath inserted at this time without any complications.  9.  Antiemetics: No nausea or vomiting reported at this time.  Compazine is available to him.  10.  Follow-up: In 3 weeks for repeat follow-up and next cycle of therapy.   30 minutes were dedicated to this visit.  The time was spent on reviewing his laboratory data, assessing his disease status and addressing complication related to therapy.    Zola Button, MD 5/20/20218:47 AM

## 2020-05-15 NOTE — Patient Instructions (Signed)
[facility] Discharge Instructions for Patients Receiving Chemotherapy  Today you received the following chemotherapy agents: Jevtana  To help prevent nausea and vomiting after your treatment, we encourage you to take your nausea medication as directed.   If you develop nausea and vomiting that is not controlled by your nausea medication, call the clinic.   BELOW ARE SYMPTOMS THAT SHOULD BE REPORTED IMMEDIATELY:  *FEVER GREATER THAN 100.5 F  *CHILLS WITH OR WITHOUT FEVER  NAUSEA AND VOMITING THAT IS NOT CONTROLLED WITH YOUR NAUSEA MEDICATION  *UNUSUAL SHORTNESS OF BREATH  *UNUSUAL BRUISING OR BLEEDING  TENDERNESS IN MOUTH AND THROAT WITH OR WITHOUT PRESENCE OF ULCERS  *URINARY PROBLEMS  *BOWEL PROBLEMS  UNUSUAL RASH Items with * indicate a potential emergency and should be followed up as soon as possible.  Feel free to call the clinic should you have any questions or concerns. The clinic phone number is [phone].  Please show the [place] at check-in to the Emergency Department and triage nurse.

## 2020-05-16 ENCOUNTER — Other Ambulatory Visit: Payer: Self-pay | Admitting: Radiation Therapy

## 2020-05-16 LAB — PROSTATE-SPECIFIC AG, SERUM (LABCORP): Prostate Specific Ag, Serum: 98.7 ng/mL — ABNORMAL HIGH (ref 0.0–4.0)

## 2020-05-17 ENCOUNTER — Telehealth: Payer: Self-pay | Admitting: Emergency Medicine

## 2020-05-17 ENCOUNTER — Inpatient Hospital Stay: Payer: Medicare Other

## 2020-05-17 NOTE — Telephone Encounter (Signed)
Called pt regarding missed injection appt today.  Pt states he tried to call earlier and cancel his appt d/t bilat lower leg soreness/pain.  Pt requesting to move injection appt to early next week.  Pt advised that appt for today would be cancelled and he would receive a call from [facility] scheduling dept to get injection on Monday.  Pt verbalized understanding.  Message sent to scheduling high priority, injection appt cancelled for today.

## 2020-05-19 ENCOUNTER — Other Ambulatory Visit: Payer: Self-pay

## 2020-05-19 ENCOUNTER — Telehealth: Payer: Self-pay | Admitting: Orthopaedic Surgery

## 2020-05-19 ENCOUNTER — Inpatient Hospital Stay: Payer: Medicare Other

## 2020-05-19 VITALS — BP 122/78 | HR 78 | Temp 98.2°F | Resp 18

## 2020-05-19 DIAGNOSIS — Z5111 Encounter for antineoplastic chemotherapy: Secondary | ICD-10-CM | POA: Diagnosis not present

## 2020-05-19 DIAGNOSIS — C61 Malignant neoplasm of prostate: Secondary | ICD-10-CM

## 2020-05-19 MED ORDER — PEGFILGRASTIM-CBQV 6 MG/0.6ML ~~LOC~~ SOSY
6.0000 mg | PREFILLED_SYRINGE | Freq: Once | SUBCUTANEOUS | Status: AC
  Administered 2020-05-19: 6 mg via SUBCUTANEOUS

## 2020-05-19 NOTE — Patient Instructions (Signed)

## 2020-05-19 NOTE — Telephone Encounter (Signed)
Crystal from [facility] called.   She was following up on paperwork for a prescription that was sent over on behalf of the patient. It was originally faxed over on May 20th.   Fax number: [phone]  Call back: [phone]

## 2020-05-21 NOTE — Telephone Encounter (Signed)
They called [facility]. Advised for them to refax. Pending.Marland KitchenMarland Kitchen

## 2020-05-28 ENCOUNTER — Encounter: Payer: Self-pay | Admitting: Oncology

## 2020-05-30 ENCOUNTER — Other Ambulatory Visit: Payer: Self-pay | Admitting: Oncology

## 2020-05-30 ENCOUNTER — Telehealth: Payer: Self-pay

## 2020-05-30 ENCOUNTER — Other Ambulatory Visit: Payer: Self-pay

## 2020-05-30 MED ORDER — FUROSEMIDE 20 MG PO TABS
20.0000 mg | ORAL_TABLET | Freq: Every day | ORAL | 0 refills | Status: DC
Start: 2020-05-30 — End: 2020-05-30

## 2020-05-30 NOTE — Progress Notes (Signed)

## 2020-05-30 NOTE — Telephone Encounter (Signed)
TC from Pt stating he still has bilateral edema in both legs. Talked to Pt about diet and elevating his legs since this has been an ongoing problem. Informed Pt he could use compression stockings for when he is walking around the house to help with the edema and per Dr.Shadad a prescription for Lasix will be sent to the pharmacy. Pt. verbalized understanding. Next appointment confirmed as well.

## 2020-06-05 ENCOUNTER — Inpatient Hospital Stay: Payer: Medicare Other

## 2020-06-05 ENCOUNTER — Inpatient Hospital Stay: Payer: Medicare Other | Attending: Oncology

## 2020-06-05 ENCOUNTER — Other Ambulatory Visit: Payer: Self-pay

## 2020-06-05 ENCOUNTER — Inpatient Hospital Stay (HOSPITAL_BASED_OUTPATIENT_CLINIC_OR_DEPARTMENT_OTHER): Payer: Medicare Other | Admitting: Oncology

## 2020-06-05 ENCOUNTER — Telehealth: Payer: Self-pay | Admitting: Oncology

## 2020-06-05 VITALS — BP 117/66 | HR 112 | Temp 97.9°F | Resp 20 | Ht 68.0 in

## 2020-06-05 DIAGNOSIS — M898X9 Other specified disorders of bone, unspecified site: Secondary | ICD-10-CM | POA: Insufficient documentation

## 2020-06-05 DIAGNOSIS — C61 Malignant neoplasm of prostate: Secondary | ICD-10-CM | POA: Diagnosis not present

## 2020-06-05 DIAGNOSIS — Z7901 Long term (current) use of anticoagulants: Secondary | ICD-10-CM | POA: Diagnosis not present

## 2020-06-05 DIAGNOSIS — Z5189 Encounter for other specified aftercare: Secondary | ICD-10-CM | POA: Diagnosis not present

## 2020-06-05 DIAGNOSIS — Z5111 Encounter for antineoplastic chemotherapy: Secondary | ICD-10-CM | POA: Diagnosis present

## 2020-06-05 DIAGNOSIS — C7951 Secondary malignant neoplasm of bone: Secondary | ICD-10-CM | POA: Diagnosis not present

## 2020-06-05 DIAGNOSIS — R600 Localized edema: Secondary | ICD-10-CM | POA: Diagnosis not present

## 2020-06-05 DIAGNOSIS — Z79899 Other long term (current) drug therapy: Secondary | ICD-10-CM | POA: Insufficient documentation

## 2020-06-05 DIAGNOSIS — Z923 Personal history of irradiation: Secondary | ICD-10-CM | POA: Insufficient documentation

## 2020-06-05 DIAGNOSIS — Z79818 Long term (current) use of other agents affecting estrogen receptors and estrogen levels: Secondary | ICD-10-CM | POA: Diagnosis not present

## 2020-06-05 DIAGNOSIS — Z95828 Presence of other vascular implants and grafts: Secondary | ICD-10-CM | POA: Insufficient documentation

## 2020-06-05 LAB — CMP (CANCER CENTER ONLY)
ALT: 25 U/L (ref 0–44)
AST: 118 U/L — ABNORMAL HIGH (ref 15–41)
Albumin: 2.1 g/dL — ABNORMAL LOW (ref 3.5–5.0)
Alkaline Phosphatase: 1160 U/L — ABNORMAL HIGH (ref 38–126)
Anion gap: 11 (ref 5–15)
BUN: 14 mg/dL (ref 8–23)
CO2: 26 mmol/L (ref 22–32)
Calcium: 8.1 mg/dL — ABNORMAL LOW (ref 8.9–10.3)
Chloride: 92 mmol/L — ABNORMAL LOW (ref 98–111)
Creatinine: 0.86 mg/dL (ref 0.61–1.24)
GFR, Est AFR Am: >60 mL/min (ref >60)
GFR, Estimated: >60 mL/min (ref >60)
Glucose, Bld: 106 mg/dL — ABNORMAL HIGH (ref 70–99)
Potassium: 3.8 mmol/L (ref 3.5–5.1)
Sodium: 129 mmol/L — ABNORMAL LOW (ref 135–145)
Total Bilirubin: 1.2 mg/dL (ref 0.3–1.2)
Total Protein: 5.2 g/dL — ABNORMAL LOW (ref 6.5–8.1)

## 2020-06-05 LAB — CBC WITH DIFFERENTIAL (CANCER CENTER ONLY)
Abs Immature Granulocytes: 0.19 10*3/uL — ABNORMAL HIGH (ref 0.00–0.07)
Basophils Absolute: 0 10*3/uL (ref 0.0–0.1)
Basophils Relative: 0 %
Eosinophils Absolute: 0 10*3/uL (ref 0.0–0.5)
Eosinophils Relative: 0 %
HCT: 25.8 % — ABNORMAL LOW (ref 39.0–52.0)
Hemoglobin: 8.6 g/dL — ABNORMAL LOW (ref 13.0–17.0)
Immature Granulocytes: 1 %
Lymphocytes Relative: 1 %
Lymphs Abs: 0.2 10*3/uL — ABNORMAL LOW (ref 0.7–4.0)
MCH: 33 pg (ref 26.0–34.0)
MCHC: 33.3 g/dL (ref 30.0–36.0)
MCV: 98.9 fL (ref 80.0–100.0)
Monocytes Absolute: 1.1 10*3/uL — ABNORMAL HIGH (ref 0.1–1.0)
Monocytes Relative: 7 %
Neutro Abs: 13 10*3/uL — ABNORMAL HIGH (ref 1.7–7.7)
Neutrophils Relative %: 91 %
Platelet Count: 388 10*3/uL (ref 150–400)
RBC: 2.61 MIL/uL — ABNORMAL LOW (ref 4.22–5.81)
RDW: 16.2 % — ABNORMAL HIGH (ref 11.5–15.5)
WBC Count: 14.5 10*3/uL — ABNORMAL HIGH (ref 4.0–10.5)
nRBC: 0 % (ref 0.0–0.2)

## 2020-06-05 MED ORDER — SODIUM CHLORIDE 0.9% FLUSH
10.0000 mL | Freq: Once | INTRAVENOUS | Status: AC
  Administered 2020-06-05: 10 mL
  Filled 2020-06-05: qty 10

## 2020-06-05 MED ORDER — SODIUM CHLORIDE 0.9% FLUSH
10.0000 mL | INTRAVENOUS | Status: DC | PRN
  Administered 2020-06-05: 10 mL
  Filled 2020-06-05: qty 10

## 2020-06-05 MED ORDER — FAMOTIDINE IN NACL 20-0.9 MG/50ML-% IV SOLN
20.0000 mg | Freq: Once | INTRAVENOUS | Status: AC
  Administered 2020-06-05: 20 mg via INTRAVENOUS

## 2020-06-05 MED ORDER — MORPHINE SULFATE ER 30 MG PO TBCR: 30.0000 mg | EXTENDED_RELEASE_TABLET | Freq: Three times a day (TID) | ORAL | 0 refills | Status: DC

## 2020-06-05 MED ORDER — FAMOTIDINE IN NACL 20-0.9 MG/50ML-% IV SOLN: 20.0000 mg | Freq: Once | INTRAVENOUS | Status: DC

## 2020-06-05 MED ORDER — DIPHENHYDRAMINE HCL 50 MG/ML IJ SOLN
25.0000 mg | Freq: Once | INTRAMUSCULAR | Status: AC
  Administered 2020-06-05: 25 mg via INTRAVENOUS

## 2020-06-05 MED ORDER — SODIUM CHLORIDE 0.9 % IV SOLN
10.0000 mg | Freq: Once | INTRAVENOUS | Status: AC
  Administered 2020-06-05: 10 mg via INTRAVENOUS
  Filled 2020-06-05: qty 10

## 2020-06-05 MED ORDER — SODIUM CHLORIDE 0.9 % IV SOLN
20.0000 mg/m2 | Freq: Once | INTRAVENOUS | Status: AC
  Administered 2020-06-05: 36 mg via INTRAVENOUS
  Filled 2020-06-05: qty 3.6

## 2020-06-05 MED ORDER — FAMOTIDINE IN NACL 20-0.9 MG/50ML-% IV SOLN
INTRAVENOUS | Status: AC
  Filled 2020-06-05: qty 50

## 2020-06-05 MED ORDER — HYDROMORPHONE HCL 4 MG PO TABS: 4.0000 mg | ORAL_TABLET | ORAL | 0 refills | Status: AC | PRN

## 2020-06-05 MED ORDER — DIPHENHYDRAMINE HCL 50 MG/ML IJ SOLN
INTRAMUSCULAR | Status: AC
  Filled 2020-06-05: qty 1

## 2020-06-05 MED ORDER — HEPARIN SOD (PORK) LOCK FLUSH 100 UNIT/ML IV SOLN
500.0000 [IU] | Freq: Once | INTRAVENOUS | Status: AC | PRN
  Administered 2020-06-05: 500 [IU]
  Filled 2020-06-05: qty 5

## 2020-06-05 MED ORDER — SODIUM CHLORIDE 0.9 % IV SOLN
Freq: Once | INTRAVENOUS | Status: AC
  Filled 2020-06-05: qty 250

## 2020-06-05 NOTE — Patient Instructions (Signed)
[facility] Discharge Instructions for Patients Receiving Chemotherapy  Today you received the following chemotherapy agents: Jevtana  To help prevent nausea and vomiting after your treatment, we encourage you to take your nausea medication as directed.   If you develop nausea and vomiting that is not controlled by your nausea medication, call the clinic.   BELOW ARE SYMPTOMS THAT SHOULD BE REPORTED IMMEDIATELY:  *FEVER GREATER THAN 100.5 F  *CHILLS WITH OR WITHOUT FEVER  NAUSEA AND VOMITING THAT IS NOT CONTROLLED WITH YOUR NAUSEA MEDICATION  *UNUSUAL SHORTNESS OF BREATH  *UNUSUAL BRUISING OR BLEEDING  TENDERNESS IN MOUTH AND THROAT WITH OR WITHOUT PRESENCE OF ULCERS  *URINARY PROBLEMS  *BOWEL PROBLEMS  UNUSUAL RASH Items with * indicate a potential emergency and should be followed up as soon as possible.  Feel free to call the clinic should you have any questions or concerns. The clinic phone number is [phone].  Please show the [facility] at check-in to the Emergency Department and triage nurse.

## 2020-06-05 NOTE — Progress Notes (Signed)
Hematology and Oncology Follow Up Visit  Anthony Escobar [id number] 1951/08/02 69 y.o. 06/05/2020 11:24 AM [name], MDBoyd, Anthony Factor, Anthony Escobar   Principle Diagnosis: 69 year old man with advanced prostate cancer with documented disease to the bone and liver involvement diagnosed in 2019.  He has castration-resistant with PSA of 19.57 and Gleason score 4+4 = 8 at the time of diagnosis.    Prior Therapy:  He is status post prostate biopsy on December 29, 2017 showed a Gleason score 4+4 = 8 and 5 cores.   Taxotere chemotherapy with the first cycle of chemotherapy given on February 02, 2018.  He completed 6 cycles of therapy on May 18, 2018.  He is status post treatment of a intertrochanteric fracture and intramedullary implant completed on October 22, 2019 after sustaining a pathological fracture of the right hip.  He is status post right hip radiation for a total of 30 Gy in 10 fractions completed on November 27, 2019.  Xtandi 160 mg daily started in March of 2020.  Therapy discontinued in April 2021 for progression of disease.  He is status post radiation therapy to the left tibia as well as the left proximal femur and acetabulum completed in May 2021.  Current therapy:  Androgen deprivation therapy.  He received Eligard in December 2020 and will be repeated in April 2021.  Jevtana chemotherapy started on April 24, 2020.  He is here for cycle 3.   Interim History: Anthony Escobar returns today for a follow-up visit.  Since the last visit, he reports no major changes in his health.  He has reported lower extremity edema which he takes Lasix for periodically and has improved at this time.  His performance status continues to decline at this time although he has not reported any falls or syncope.  He continues to have diffuse pain and currently on morphine and Dilaudid with very little benefit at this time.  His appetite has been poor and has lost more weight.  Medications: Updated on  review. Current Outpatient Medications  Medication Sig Dispense Refill  . albuterol (PROAIR HFA) 108 (90 Base) MCG/ACT inhaler Inhale 2 puffs into the lungs every 4 (four) hours as needed for wheezing or shortness of breath. 1 Inhaler 5  . amitriptyline (ELAVIL) 75 MG tablet Take 75 mg by mouth at bedtime.    . cholecalciferol (VITAMIN D3) 25 MCG (1000 UT) tablet Take 1,000 Units by mouth daily.    . Coenzyme Q10 (COQ10 PO) Take 1 tablet by mouth daily.    .      . feeding supplement, ENSURE ENLIVE, (ENSURE ENLIVE) LIQD Take 237 mLs by mouth 2 (two) times daily between meals. 237 mL 12  . furosemide (LASIX) 20 MG tablet TAKE 1 TABLET(20 MG) BY MOUTH DAILY 90 tablet 1  . HYDROmorphone (DILAUDID) 4 MG tablet Take 1 tablet (4 mg total) by mouth every 4 (four) hours as needed for severe pain. 60 tablet 0  . ibuprofen (ADVIL) 200 MG tablet Take 400 mg by mouth every 6 (six) hours as needed for moderate pain.    Marland Kitchen morphine (MS CONTIN) 30 MG 12 hr tablet Take 1 tablet (30 mg total) by mouth every 12 (twelve) hours. 60 tablet 0  . Multiple Vitamin (MULTIVITAMIN) tablet Take 1 tablet by mouth daily.    . prochlorperazine (COMPAZINE) 10 MG tablet Take 1 tablet (10 mg total) by mouth every 6 (six) hours as needed for nausea or vomiting. 30 tablet 0  . rivaroxaban (XARELTO)  10 MG TABS tablet Take 1 tablet (10 mg total) by mouth daily. (Patient not taking: Reported on 04/18/2020) 30 tablet 0  . Tiotropium Bromide Monohydrate (SPIRIVA RESPIMAT) 2.5 MCG/ACT AERS Inhale 2 puffs into the lungs daily. 2 Inhaler 0   No current facility-administered medications for this visit.     Allergies: No Known Allergies    Physical Exam:    Blood pressure 117/66, pulse (!) 112, temperature 97.9 F (36.6 C), temperature source Temporal, resp. rate 20, height 5\' 8"  (1.727 m), SpO2 96 %.     ECOG: 2    General appearance: Comfortable appearing without any discomfort.  Appears chronically ill. Head:  Normocephalic without any trauma Oropharynx: Mucous membranes are moist and pink without any thrush or ulcers. Eyes: Pupils are equal and round reactive to light. Lymph nodes: No cervical, supraclavicular, inguinal or axillary lymphadenopathy.   Heart:regular rate and rhythm.  S1 and S2.  1+ edema in lower extremity bilaterally. Lung: Clear without any rhonchi or wheezes.  No dullness to percussion. Abdomin: Soft, nontender, nondistended with good bowel sounds.  No hepatosplenomegaly. Musculoskeletal: No joint deformity or effusion.  Full range of motion noted. Neurological: No deficits noted on motor, sensory and deep tendon reflex exam. Skin: No petechial rash or dryness.  Appeared moist.                Lab Results: Lab Results  Component Value Date   WBC 14.4 (H) 05/15/2020   HGB 9.1 (L) 05/15/2020   HCT 27.2 (L) 05/15/2020   MCV 100.7 (H) 05/15/2020   PLT 377 05/15/2020     Chemistry      Component Value Date/Time   NA 128 (L) 05/15/2020 0839   K 4.5 05/15/2020 0839   CL 94 (L) 05/15/2020 0839   CO2 23 05/15/2020 0839   BUN 15 05/15/2020 0839   CREATININE 1.05 05/15/2020 0839   CREATININE 0.88 05/23/2013 1422      Component Value Date/Time   CALCIUM 8.6 (L) 05/15/2020 0839   ALKPHOS 654 (H) 05/15/2020 0839   AST 69 (H) 05/15/2020 0839   ALT 29 05/15/2020 0839   BILITOT 0.8 05/15/2020 0839     Results for Anthony Escobar, Anthony Escobar" (MRN [id number]) as of 06/05/2020 11:26  Ref. Range 02/19/2020 08:08 04/24/2020 08:33 05/15/2020 08:39  Prostate Specific Ag, Serum Latest Ref Range: 0.0 - 4.0 ng/mL 11.2 (H) 77.7 (H) 98.7 (H)    Impression and Plan:  69 year old man with:   1.    Advanced prostate cancer with disease to the bone and liver metastasis initially diagnosed in 2019 but showed progression of disease in April 21.  He has castration-resistant at this time.Marland Kitchen     He is currently on Jevtana chemotherapy which she has tolerated without any major  complications.  Risks and benefits of continuing this treatment were reviewed.  Potential complications include nausea, vomiting, myelosuppression and neutropenia as well as potential sepsis were discussed.  He is agreeable to continue at this time but also understands that his disease could be unresponsive to chemotherapy and he would be a hospice candidate at that time.   2.    Bone pain: He is currently on morphine and Dilaudid for breakthrough pain medication.  His pain is related to metastatic cancer to the bone.  We will increase his morphine to 3 times a day with use of Dilaudid as needed.   4.  Androgen depravation: Long-term complications associated with this treatment including weight gain, hot flashes among  others.  Next injection will be tentatively August 2021.  5.  Prognosis: Therapy is palliative at this time with his disease is incurable.  Aggressive measures are currently warranted given his reasonable performance status.  6.  Bone directed therapy: I recommended calcium and vitamin D supplements.  7.  Left tibial mass: Completed radiation therapy.  No major changes in terms of size and pain at this time.  We will continue to monitor moving forward.  8.  IV access: Port-A-Cath currently in use without any applications.  9.  Antiemetics: Compazine is available to him without any nausea or vomiting.  10.  Follow-up: He will return in 3 weeks for the next follow-up visit in chemotherapy evaluation.   30 minutes were spent on this encounter.  The time was dedicated to updating his disease status, discussing treatment options and addressing complication related to his cancer and cancer therapy.    Zola Button, Anthony Escobar 6/10/202111:24 AM

## 2020-06-05 NOTE — Telephone Encounter (Signed)
Scheduled appt per 6/10 los.  Pt will get an updated appt calendar at their next scheduled appt.

## 2020-06-05 NOTE — Progress Notes (Signed)
Okay to treat with AST 118 and elevated pulse per Dr. Alen Blew

## 2020-06-06 ENCOUNTER — Telehealth: Payer: Self-pay

## 2020-06-06 LAB — PROSTATE-SPECIFIC AG, SERUM (LABCORP): Prostate Specific Ag, Serum: 120 ng/mL — ABNORMAL HIGH (ref 0.0–4.0)

## 2020-06-06 NOTE — Telephone Encounter (Signed)
-----   Message from [name], MD sent at 06/06/2020 10:21 AM EDT ----- Please let him know his PSA is up. No changes for now.

## 2020-06-06 NOTE — Telephone Encounter (Signed)
Called patient and made him aware of PSA results. Patient verbalized understanding.

## 2020-06-07 ENCOUNTER — Other Ambulatory Visit: Payer: Self-pay

## 2020-06-07 ENCOUNTER — Inpatient Hospital Stay: Payer: Medicare Other

## 2020-06-07 DIAGNOSIS — C61 Malignant neoplasm of prostate: Secondary | ICD-10-CM

## 2020-06-07 DIAGNOSIS — Z5111 Encounter for antineoplastic chemotherapy: Secondary | ICD-10-CM | POA: Diagnosis not present

## 2020-06-07 MED ORDER — PEGFILGRASTIM-CBQV 6 MG/0.6ML ~~LOC~~ SOSY
6.0000 mg | PREFILLED_SYRINGE | Freq: Once | SUBCUTANEOUS | Status: AC
  Administered 2020-06-07: 6 mg via SUBCUTANEOUS

## 2020-06-10 ENCOUNTER — Encounter: Payer: Self-pay | Admitting: Oncology

## 2020-06-15 ENCOUNTER — Encounter: Payer: Self-pay | Admitting: Oncology

## 2020-06-16 ENCOUNTER — Inpatient Hospital Stay (HOSPITAL_COMMUNITY)
Admission: EM | Admit: 2020-06-16 | Discharge: 2020-06-18 | DRG: 948 | Disposition: A | Discharge status: Hospice | Payer: Medicare Other | Attending: Internal Medicine | Admitting: Internal Medicine

## 2020-06-16 ENCOUNTER — Encounter: Payer: Self-pay | Admitting: Oncology

## 2020-06-16 ENCOUNTER — Emergency Department (HOSPITAL_COMMUNITY): Payer: Medicare Other

## 2020-06-16 ENCOUNTER — Other Ambulatory Visit: Payer: Self-pay

## 2020-06-16 ENCOUNTER — Encounter (HOSPITAL_COMMUNITY): Payer: Self-pay | Admitting: Internal Medicine

## 2020-06-16 ENCOUNTER — Telehealth: Payer: Self-pay | Admitting: *Deleted

## 2020-06-16 DIAGNOSIS — C787 Secondary malignant neoplasm of liver and intrahepatic bile duct: Secondary | ICD-10-CM | POA: Diagnosis present

## 2020-06-16 DIAGNOSIS — L89322 Pressure ulcer of left buttock, stage 2: Secondary | ICD-10-CM | POA: Diagnosis present

## 2020-06-16 DIAGNOSIS — E871 Hypo-osmolality and hyponatremia: Secondary | ICD-10-CM | POA: Diagnosis present

## 2020-06-16 DIAGNOSIS — F329 Major depressive disorder, single episode, unspecified: Secondary | ICD-10-CM | POA: Diagnosis present

## 2020-06-16 DIAGNOSIS — G893 Neoplasm related pain (acute) (chronic): Principal | ICD-10-CM | POA: Diagnosis present

## 2020-06-16 DIAGNOSIS — Y92009 Unspecified place in unspecified non-institutional (private) residence as the place of occurrence of the external cause: Secondary | ICD-10-CM

## 2020-06-16 DIAGNOSIS — Z7901 Long term (current) use of anticoagulants: Secondary | ICD-10-CM

## 2020-06-16 DIAGNOSIS — T402X6A Underdosing of other opioids, initial encounter: Secondary | ICD-10-CM | POA: Diagnosis present

## 2020-06-16 DIAGNOSIS — C7951 Secondary malignant neoplasm of bone: Secondary | ICD-10-CM | POA: Diagnosis present

## 2020-06-16 DIAGNOSIS — E860 Dehydration: Secondary | ICD-10-CM

## 2020-06-16 DIAGNOSIS — D539 Nutritional anemia, unspecified: Secondary | ICD-10-CM | POA: Diagnosis present

## 2020-06-16 DIAGNOSIS — E876 Hypokalemia: Secondary | ICD-10-CM | POA: Diagnosis present

## 2020-06-16 DIAGNOSIS — Z515 Encounter for palliative care: Secondary | ICD-10-CM

## 2020-06-16 DIAGNOSIS — R682 Dry mouth, unspecified: Secondary | ICD-10-CM | POA: Diagnosis present

## 2020-06-16 DIAGNOSIS — J449 Chronic obstructive pulmonary disease, unspecified: Secondary | ICD-10-CM | POA: Diagnosis present

## 2020-06-16 DIAGNOSIS — R531 Weakness: Secondary | ICD-10-CM | POA: Diagnosis not present

## 2020-06-16 DIAGNOSIS — Z66 Do not resuscitate: Secondary | ICD-10-CM | POA: Diagnosis present

## 2020-06-16 DIAGNOSIS — C61 Malignant neoplasm of prostate: Secondary | ICD-10-CM | POA: Diagnosis not present

## 2020-06-16 DIAGNOSIS — L8990 Pressure ulcer of unspecified site, unspecified stage: Secondary | ICD-10-CM | POA: Insufficient documentation

## 2020-06-16 DIAGNOSIS — Z87891 Personal history of nicotine dependence: Secondary | ICD-10-CM

## 2020-06-16 DIAGNOSIS — D72829 Elevated white blood cell count, unspecified: Secondary | ICD-10-CM | POA: Diagnosis present

## 2020-06-16 DIAGNOSIS — E785 Hyperlipidemia, unspecified: Secondary | ICD-10-CM | POA: Diagnosis present

## 2020-06-16 DIAGNOSIS — R627 Adult failure to thrive: Secondary | ICD-10-CM | POA: Diagnosis present

## 2020-06-16 DIAGNOSIS — I10 Essential (primary) hypertension: Secondary | ICD-10-CM | POA: Diagnosis present

## 2020-06-16 DIAGNOSIS — Z9114 Patient's other noncompliance with medication regimen: Secondary | ICD-10-CM

## 2020-06-16 DIAGNOSIS — Z6821 Body mass index (BMI) 21.0-21.9, adult: Secondary | ICD-10-CM

## 2020-06-16 DIAGNOSIS — R64 Cachexia: Secondary | ICD-10-CM | POA: Diagnosis present

## 2020-06-16 DIAGNOSIS — Z79899 Other long term (current) drug therapy: Secondary | ICD-10-CM

## 2020-06-16 DIAGNOSIS — Z20822 Contact with and (suspected) exposure to covid-19: Secondary | ICD-10-CM | POA: Diagnosis present

## 2020-06-16 LAB — MAGNESIUM: Magnesium: 1.6 mg/dL — ABNORMAL LOW (ref 1.7–2.4)

## 2020-06-16 LAB — CBC WITH DIFFERENTIAL/PLATELET
Abs Immature Granulocytes: 3.78 10*3/uL — ABNORMAL HIGH (ref 0.00–0.07)
Basophils Absolute: 0.1 10*3/uL (ref 0.0–0.1)
Basophils Relative: 0 %
Eosinophils Absolute: 0.1 10*3/uL (ref 0.0–0.5)
Eosinophils Relative: 0 %
HCT: 23.4 % — ABNORMAL LOW (ref 39.0–52.0)
Hemoglobin: 7.7 g/dL — ABNORMAL LOW (ref 13.0–17.0)
Immature Granulocytes: 9 %
Lymphocytes Relative: 2 %
Lymphs Abs: 0.7 10*3/uL (ref 0.7–4.0)
MCH: 33.9 pg (ref 26.0–34.0)
MCHC: 32.9 g/dL (ref 30.0–36.0)
MCV: 103.1 fL — ABNORMAL HIGH (ref 80.0–100.0)
Monocytes Absolute: 2.3 10*3/uL — ABNORMAL HIGH (ref 0.1–1.0)
Monocytes Relative: 6 %
Neutro Abs: 34.9 10*3/uL — ABNORMAL HIGH (ref 1.7–7.7)
Neutrophils Relative %: 83 %
Platelets: 404 10*3/uL — ABNORMAL HIGH (ref 150–400)
RBC: 2.27 MIL/uL — ABNORMAL LOW (ref 4.22–5.81)
RDW: 17.4 % — ABNORMAL HIGH (ref 11.5–15.5)
WBC: 41.9 10*3/uL — ABNORMAL HIGH (ref 4.0–10.5)
nRBC: 0.1 % (ref 0.0–0.2)

## 2020-06-16 LAB — COMPREHENSIVE METABOLIC PANEL
ALT: 22 U/L (ref 0–44)
AST: 62 U/L — ABNORMAL HIGH (ref 15–41)
Albumin: 2.1 g/dL — ABNORMAL LOW (ref 3.5–5.0)
Alkaline Phosphatase: 586 U/L — ABNORMAL HIGH (ref 38–126)
Anion gap: 10 (ref 5–15)
BUN: 10 mg/dL (ref 8–23)
CO2: 26 mmol/L (ref 22–32)
Calcium: 7.5 mg/dL — ABNORMAL LOW (ref 8.9–10.3)
Chloride: 98 mmol/L (ref 98–111)
Creatinine, Ser: 0.77 mg/dL (ref 0.61–1.24)
GFR calc Af Amer: >60 mL/min (ref >60)
GFR calc non Af Amer: >60 mL/min (ref >60)
Glucose, Bld: 99 mg/dL (ref 70–99)
Potassium: 3.2 mmol/L — ABNORMAL LOW (ref 3.5–5.1)
Sodium: 134 mmol/L — ABNORMAL LOW (ref 135–145)
Total Bilirubin: 0.9 mg/dL (ref 0.3–1.2)
Total Protein: 4.8 g/dL — ABNORMAL LOW (ref 6.5–8.1)

## 2020-06-16 LAB — URINALYSIS, COMPLETE (UACMP) WITH MICROSCOPIC
Bilirubin Urine: NEGATIVE
Glucose, UA: NEGATIVE mg/dL
Hgb urine dipstick: NEGATIVE
Ketones, ur: 5 mg/dL — AB
Leukocytes,Ua: NEGATIVE
Nitrite: NEGATIVE
Protein, ur: 30 mg/dL — AB
Specific Gravity, Urine: 1.015 (ref 1.005–1.030)
pH: 7 (ref 5.0–8.0)

## 2020-06-16 LAB — SARS CORONAVIRUS 2 BY RT PCR (HOSPITAL ORDER, PERFORMED IN ~~LOC~~ HOSPITAL LAB): SARS Coronavirus 2: NEGATIVE

## 2020-06-16 LAB — TROPONIN I (HIGH SENSITIVITY): Troponin I (High Sensitivity): 14 ng/L (ref <18)

## 2020-06-16 MED ORDER — ONDANSETRON HCL 4 MG PO TABS
4.0000 mg | ORAL_TABLET | Freq: Four times a day (QID) | ORAL | Status: DC | PRN
  Administered 2020-06-16: 4 mg via ORAL
  Filled 2020-06-16: qty 1

## 2020-06-16 MED ORDER — ALBUTEROL SULFATE (2.5 MG/3ML) 0.083% IN NEBU: 2.5000 mg | INHALATION_SOLUTION | RESPIRATORY_TRACT | Status: DC | PRN

## 2020-06-16 MED ORDER — POTASSIUM CHLORIDE CRYS ER 20 MEQ PO TBCR
40.0000 meq | EXTENDED_RELEASE_TABLET | Freq: Once | ORAL | Status: AC
  Administered 2020-06-16: 40 meq via ORAL
  Filled 2020-06-16: qty 2

## 2020-06-16 MED ORDER — ACETAMINOPHEN 650 MG RE SUPP: 650.0000 mg | Freq: Four times a day (QID) | RECTAL | Status: DC | PRN

## 2020-06-16 MED ORDER — IPRATROPIUM-ALBUTEROL 0.5-2.5 (3) MG/3ML IN SOLN
3.0000 mL | Freq: Once | RESPIRATORY_TRACT | Status: AC
  Administered 2020-06-16: 3 mL via RESPIRATORY_TRACT
  Filled 2020-06-16: qty 3

## 2020-06-16 MED ORDER — BIOTENE DRY MOUTH MT LIQD: 15.0000 mL | OROMUCOSAL | Status: DC | PRN

## 2020-06-16 MED ORDER — OXYCODONE HCL 5 MG PO TABS
10.0000 mg | ORAL_TABLET | ORAL | Status: DC | PRN
  Administered 2020-06-17 – 2020-06-18 (×2): 10 mg via ORAL
  Filled 2020-06-16 (×2): qty 2

## 2020-06-16 MED ORDER — ACETAMINOPHEN 325 MG PO TABS: 650.0000 mg | ORAL_TABLET | Freq: Four times a day (QID) | ORAL | Status: DC | PRN

## 2020-06-16 MED ORDER — LACTATED RINGERS IV SOLN: INTRAVENOUS | Status: DC

## 2020-06-16 MED ORDER — POLYETHYLENE GLYCOL 3350 17 G PO PACK: 17.0000 g | PACK | Freq: Every day | ORAL | Status: DC | PRN

## 2020-06-16 MED ORDER — ONDANSETRON HCL 4 MG/2ML IJ SOLN
4.0000 mg | Freq: Four times a day (QID) | INTRAMUSCULAR | Status: DC | PRN
  Administered 2020-06-18: 4 mg via INTRAVENOUS
  Filled 2020-06-16: qty 2

## 2020-06-16 MED ORDER — BISACODYL 5 MG PO TBEC
5.0000 mg | DELAYED_RELEASE_TABLET | Freq: Every day | ORAL | Status: DC | PRN
  Filled 2020-06-16: qty 1

## 2020-06-16 MED ORDER — UMECLIDINIUM BROMIDE 62.5 MCG/INH IN AEPB
1.0000 | INHALATION_SPRAY | Freq: Every day | RESPIRATORY_TRACT | Status: DC
  Administered 2020-06-18: 1 via RESPIRATORY_TRACT
  Filled 2020-06-16: qty 7

## 2020-06-16 MED ORDER — MORPHINE SULFATE ER 30 MG PO TBCR
30.0000 mg | EXTENDED_RELEASE_TABLET | Freq: Two times a day (BID) | ORAL | Status: DC
  Administered 2020-06-16 (×2): 30 mg via ORAL
  Filled 2020-06-16 (×2): qty 1

## 2020-06-16 MED ORDER — ZOLPIDEM TARTRATE 5 MG PO TABS
5.0000 mg | ORAL_TABLET | Freq: Once | ORAL | Status: AC
  Administered 2020-06-16: 5 mg via ORAL
  Filled 2020-06-16: qty 1

## 2020-06-16 MED ORDER — HYDROMORPHONE HCL 1 MG/ML IJ SOLN
1.0000 mg | INTRAMUSCULAR | Status: DC | PRN
  Administered 2020-06-18: 1 mg via INTRAVENOUS
  Filled 2020-06-16: qty 1

## 2020-06-16 MED ORDER — TIOTROPIUM BROMIDE MONOHYDRATE 2.5 MCG/ACT IN AERS: 2.0000 | INHALATION_SPRAY | Freq: Every day | RESPIRATORY_TRACT | Status: DC

## 2020-06-16 MED ORDER — ALBUTEROL SULFATE HFA 108 (90 BASE) MCG/ACT IN AERS: 2.0000 | INHALATION_SPRAY | RESPIRATORY_TRACT | Status: DC | PRN

## 2020-06-16 MED ORDER — MAGNESIUM SULFATE 2 GM/50ML IV SOLN
2.0000 g | Freq: Once | INTRAVENOUS | Status: AC
  Administered 2020-06-16: 2 g via INTRAVENOUS
  Filled 2020-06-16: qty 50

## 2020-06-16 NOTE — Telephone Encounter (Signed)
Notified son Ridhaan Dreibelbis following MyChart message that Dr. Alen Blew will refer patient to hospice care. He states he brought father to hospital ED since sending message as father is declining quickly. Contacted [facility] to make Hospice referral - they will contact patient/son today. Dr. Alen Blew will be attending.

## 2020-06-16 NOTE — ED Provider Notes (Addendum)
[facility] DEPT Provider Note   CSN: [id number] Arrival date & time: 06/16/20  1012     History Chief Complaint  Patient presents with  . Weakness  . Shortness of Breath  . Failure To Thrive    Anthony Escobar is a 69 y.o. male.  HPI  Patient is a 69 year old male with a past medical history significant for metastatic prostate cancer with mets to the bones, and liver.  He also has a history of COPD, HTN, HLD and chronic right hip pain after fracture and intramedullary nail fixation.  She is presenting to the emergency department for complaints of cough for 1 day, decreased appetite, uncontrolled pain.  Endorses shortness of breath as well as right upper quadrant abdominal pain and right hip pain.  Patient lives with his granddaughter.  He has been significantly less mobile for the past 6 months.  Patient granddaughter notes that patient has been refusing morphine recently and has had increased irritability and mood changes likely secondary to the pain.  He is having healthy sleeping.  He describes the pain in right hip as severe constant achy with no aggravating or mitigating factors other than walking and touch making it worse.  He has not been taking his pain medication but states that this does typically help with his pain.      Past Medical History:  Diagnosis Date  . Cancer of lung ([place])   . Dyspnea   . Hyperlipidemia   . Hypertension   . Prostate cancer [facility])     Patient Active Problem List   Diagnosis Date Noted  . Generalized weakness 06/16/2020  . Dry mouth 06/16/2020  . Hypokalemia 06/16/2020  . Hypomagnesemia 06/16/2020  . Port-A-Cath in place 06/05/2020  . Colitis, acute 05/11/2020  . Dehydration 05/11/2020  . Hyperbilirubinemia 05/11/2020  . Elevated liver function tests 05/11/2020  . Liver metastasis ([place]) 05/11/2020  . Right leg pain 05/11/2020  . Essential hypertension 05/11/2020  . HLD (hyperlipidemia) 05/11/2020  .  Former smoker 05/11/2020  . Prostate cancer metastatic to bone ([place]) 11/09/2019  . Hyperkalemia 10/19/2019  . Hyponatremia 10/19/2019  . Pathologic intertrochanteric fracture, right, initial encounter ([place]) 10/17/2019  . Acute bronchitis 12/15/2018  . Goals of care, counseling/discussion 04/27/2018  . Prostate cancer ([place]) 01/19/2018  . Multiple pulmonary nodules 10/28/2017  . COPD (chronic obstructive pulmonary disease) ([place]) 10/18/2017  . Tobacco use 10/18/2017    Past Surgical History:  Procedure Laterality Date  . HERNIA REPAIR  1988   bilateral inguinal  . INTRAMEDULLARY (IM) NAIL INTERTROCHANTERIC Right 10/22/2019   Procedure: RIGHT INTERTROCHANTERIC INTRAMEDULLARY (IM) NAIL;  Surgeon: Leandrew Koyanagi, MD;  Location: [facility];  Service: Orthopedics;  Laterality: Right;  . IR IMAGING GUIDED PORT INSERTION  04/23/2020       Family History  Problem Relation Age of Onset  . Stroke Mother   . Cancer Father        esophageal  . Cancer Paternal Grandmother 75       unknown  . Prostate cancer Neg Hx   . Breast cancer Neg Hx   . Colon cancer Neg Hx     Social History   Tobacco Use  . Smoking status: Former Smoker    Packs/day: 1.50    Years: 50.00    Pack years: 75.00    Types: Cigarettes    Quit date: 08/20/2011    Years since quitting: 8.8  . Smokeless tobacco: Never Used  Vaping Use  . Vaping  Use: Never used  Substance Use Topics  . Alcohol use: Yes    Comment: 1beer a day  . Drug use: No    Home Medications Prior to Admission medications   Medication Sig Start Date End Date Taking? Authorizing Provider  albuterol (PROAIR HFA) 108 (90 Base) MCG/ACT inhaler Inhale 2 puffs into the lungs every 4 (four) hours as needed for wheezing or shortness of breath. 03/14/18   Collene Gobble, MD  amitriptyline (ELAVIL) 75 MG tablet Take 75 mg by mouth at bedtime.    [provider]  cholecalciferol (VITAMIN D3) 25 MCG (1000 UT) tablet Take 1,000 Units by mouth daily.     [provider]  Coenzyme Q10 (COQ10 PO) Take 1 tablet by mouth daily.    [provider]  enzalutamide Gillermina Phy) 40 MG capsule Take 4 capsules (160 mg total) by mouth daily. Patient not taking: Reported on 05/11/2020 03/27/20   [name], MD  feeding supplement, ENSURE ENLIVE, (ENSURE ENLIVE) LIQD Take 237 mLs by mouth 2 (two) times daily between meals. 05/13/20   Guilford Shi, MD  furosemide (LASIX) 20 MG tablet TAKE 1 TABLET(20 MG) BY MOUTH DAILY 05/30/20   [name], MD  HYDROmorphone (DILAUDID) 4 MG tablet Take 1 tablet (4 mg total) by mouth every 4 (four) hours as needed for severe pain. 06/05/20   [name], MD  ibuprofen (ADVIL) 200 MG tablet Take 400 mg by mouth every 6 (six) hours as needed for moderate pain.    [provider]  morphine (MS CONTIN) 30 MG 12 hr tablet Take 1 tablet (30 mg total) by mouth in the morning, at noon, and at bedtime. 06/05/20   [name], MD  Multiple Vitamin (MULTIVITAMIN) tablet Take 1 tablet by mouth daily.    [provider]  prochlorperazine (COMPAZINE) 10 MG tablet Take 1 tablet (10 mg total) by mouth every 6 (six) hours as needed for nausea or vomiting. 04/16/20   [name], MD  rivaroxaban (XARELTO) 10 MG TABS tablet Take 1 tablet (10 mg total) by mouth daily. Patient not taking: Reported on 04/18/2020 11/08/19   Leandrew Koyanagi, MD  Tiotropium Bromide Monohydrate (SPIRIVA RESPIMAT) 2.5 MCG/ACT AERS Inhale 2 puffs into the lungs daily. 12/15/18   Collene Gobble, MD    Allergies    Patient has no known allergies.  Review of Systems   Review of Systems  Constitutional: Positive for activity change, appetite change and fatigue. Negative for chills and fever.  HENT: Negative for congestion.   Eyes: Negative for pain.  Respiratory: Negative for cough and shortness of breath.   Cardiovascular: Negative for chest pain and leg swelling.  Gastrointestinal: Negative for abdominal pain and  vomiting.  Genitourinary: Negative for dysuria.  Musculoskeletal: Negative for myalgias.  Skin: Negative for rash.  Neurological: Positive for weakness. Negative for dizziness and headaches.    Physical Exam Updated Vital Signs BP (!) 113/97 (BP Location: Right Arm)   Pulse (!) 102   Temp 97.9 F (36.6 C) (Oral)   Resp 15   Ht 5\' 8"  (1.727 m)   Wt 63 kg   SpO2 96%   BMI 21.12 kg/m   Physical Exam Vitals and nursing note reviewed.  Constitutional:      General: He is not in acute distress.    Appearance: He is ill-appearing (Chronically).     Comments: Is pleasant 69 year old gentleman chronically ill-appearing, in no acute distress  HENT:  Head: Normocephalic and atraumatic.     Nose: Nose normal.     Mouth/Throat:     Mouth: Mucous membranes are dry.  Eyes:     General: No scleral icterus. Cardiovascular:     Rate and Rhythm: Normal rate and regular rhythm.     Pulses: Normal pulses.     Heart sounds: Normal heart sounds.  Pulmonary:     Effort: Pulmonary effort is normal. No respiratory distress.     Breath sounds: Wheezing present.     Comments: Expiratory wheezing present on exam.  Respiratory rate is 16.  Patient did not appear acutely dyspneic.  Speaking in full sentences only occasionally having thoughts of breath.  Faint crackles/decreased lung sounds in bilateral bases. Abdominal:     Palpations: Abdomen is soft.     Tenderness: There is abdominal tenderness. There is no right CVA tenderness, left CVA tenderness, guarding or rebound.     Comments: Diffuse tenderness to palpation of the abdomen no focal tenderness.  No guarding or rebound.  No CVA tenderness.  Musculoskeletal:     Cervical back: Normal range of motion.     Right lower leg: No edema.     Left lower leg: No edema.  Skin:    General: Skin is warm and dry.     Capillary Refill: Capillary refill takes less than 2 seconds.     Comments: There is mild discoloration of the buttocks and posterior  hips.  No erythema or evidence of cellulitis.  No warmth to touch.  There is a perhaps stage 0/I pressure ulcer.  Does not appear infected.  Neurological:     Mental Status: He is alert. Mental status is at baseline.     Comments: Sensation in all 4 extremities  Psychiatric:        Mood and Affect: Mood normal.        Behavior: Behavior normal.     ED Results / Procedures / Treatments   Labs (all labs ordered are listed, but only abnormal results are displayed) Labs Reviewed  COMPREHENSIVE METABOLIC PANEL - Abnormal; Notable for the following components:      Result Value   Sodium 134 (*)    Potassium 3.2 (*)    Calcium 7.5 (*)    Total Protein 4.8 (*)    Albumin 2.1 (*)    AST 62 (*)    Alkaline Phosphatase 586 (*)    All other components within normal limits  CBC WITH DIFFERENTIAL/PLATELET - Abnormal; Notable for the following components:   WBC 41.9 (*)    RBC 2.27 (*)    Hemoglobin 7.7 (*)    HCT 23.4 (*)    MCV 103.1 (*)    RDW 17.4 (*)    Platelets 404 (*)    Neutro Abs 34.9 (*)    Monocytes Absolute 2.3 (*)    Abs Immature Granulocytes 3.78 (*)    All other components within normal limits  URINALYSIS, COMPLETE (UACMP) WITH MICROSCOPIC - Abnormal; Notable for the following components:   Ketones, ur 5 (*)    Protein, ur 30 (*)    Bacteria, UA RARE (*)    All other components within normal limits  MAGNESIUM - Abnormal; Notable for the following components:   Magnesium 1.6 (*)    All other components within normal limits  SARS CORONAVIRUS 2 BY RT PCR (HOSPITAL ORDER, [place] [facility])  CULTURE, BLOOD (ROUTINE X 2)  CULTURE, BLOOD (ROUTINE X 2)  TROPONIN  I (HIGH SENSITIVITY)    EKG None  Radiology DG Chest 2 View  Result Date: 06/16/2020 CLINICAL DATA:  69 year old male with history of weakness, shortness of breath and decreased p.o. intake over the past 2 weeks. EXAM: CHEST - 2 VIEW COMPARISON:  Chest x-ray 10/19/2019. FINDINGS: Lung  volumes are normal. No consolidative airspace disease. Trace bilateral pleural effusions. No pneumothorax. No pulmonary nodule or mass noted. Pulmonary vasculature and the cardiomediastinal silhouette are within normal limits. Atherosclerosis in the thoracic aorta. Right subclavian single-lumen porta cath with tip terminating in the superior cavoatrial junction. IMPRESSION: 1. Trace bilateral pleural effusions. 2. Aortic atherosclerosis. Electronically Signed   By: Vinnie Langton M.D.   On: 06/16/2020 12:47    Procedures Procedures (including critical care time)  Medications Ordered in ED Medications  lactated ringers infusion ( Intravenous New Bag/Given 06/16/20 1447)  acetaminophen (TYLENOL) tablet 650 mg (has no administration in time range)    Or  acetaminophen (TYLENOL) suppository 650 mg (has no administration in time range)  ondansetron (ZOFRAN) tablet 4 mg (has no administration in time range)    Or  ondansetron (ZOFRAN) injection 4 mg (has no administration in time range)  polyethylene glycol (MIRALAX / GLYCOLAX) packet 17 g (has no administration in time range)  bisacodyl (DULCOLAX) EC tablet 5 mg (has no administration in time range)  morphine (MS CONTIN) 12 hr tablet 30 mg (has no administration in time range)  oxyCODONE (Oxy IR/ROXICODONE) immediate release tablet 10 mg (has no administration in time range)  HYDROmorphone (DILAUDID) injection 1 mg (has no administration in time range)  antiseptic oral rinse (BIOTENE) solution 15 mL (has no administration in time range)  albuterol (PROVENTIL) (2.5 MG/3ML) 0.083% nebulizer solution 2.5 mg (has no administration in time range)  umeclidinium bromide (INCRUSE ELLIPTA) 62.5 MCG/INH 1 puff (has no administration in time range)  ipratropium-albuterol (DUONEB) 0.5-2.5 (3) MG/3ML nebulizer solution 3 mL (3 mLs Nebulization Given 06/16/20 1125)  magnesium sulfate IVPB 2 g 50 mL (0 g Intravenous Stopped 06/16/20 1229)  potassium chloride SA  (KLOR-CON) CR tablet 40 mEq (40 mEq Oral Given 06/16/20 1243)    ED Course  I have reviewed the triage vital signs and the nursing notes.  Pertinent labs & imaging results that were available during my care of the patient were reviewed by me and considered in my medical decision making (see chart for details).  Patient is a chronically ill-appearing 69 year old gentleman with a history of metastatic prostate cancer which has mets to his liver, bones including his tibia.  He is followed by oncology here at [facility] with Dr. Alen Blew.   I discussed this case with my attending physician who cosigned this note including patient's presenting symptoms, physical exam, and planned diagnostics and interventions. Attending physician stated agreement with plan or made changes to plan which were implemented.   Attending physician assessed patient at bedside.  Clinical Course as of Jun 17 1519  Mon Jun 16, 2020  1335 Discussed with Nicole [facility].  She will admit patient to hospital for gentle rehydration, electrolyte repletion, monitoring and set up with hospice so the patient is able to be discharged with hospice plan in place.   [WF]  1347 Urinalysis with ketones and protein consistent with poor p.o. intake.  Rare bacteria patient has no UTI symptoms no indication to treat this time.  Urinalysis, Complete w Microscopic(!) [WF]  1348 CBC with notable leukocytosis likely secondary to CSF medication from oncology.  He  has received this after chemo transfusions.  Patient does have anemia of 7.7 this is lower than his usual.  This may be contributing to his weakness and fatigue today.  Discussed both of these findings with patient's oncologist who suggested that leukocytosis is likely a result of colony-stimulating factor.  Recommended admission until hospice is set up and recommended deferring blood transfusion to the admitting providers discretion.  CBC WITH DIFFERENTIAL(!) [WF]    1349 Troponin within normal limits.  Doubt ACS this was obtained as patient has had progressive dyspnea, cough several weeks.  Doubt any cardiac event causing this.  Troponin I (High Sensitivity) [WF]  1349 CMP with potassium of 3.2 this was repleted orally.  Magnesium was 1.6 which was repleted IV prior to obtaining this lab as patient was having some mild wheezing.  No other significant deviations from patient's baseline labs.  Comprehensive metabolic panel(!) [WF]  9833 Patient has trace bilateral pleural effusions likely secondary to low albumin because of his liver metastases.  DG Chest 2 View [WF]  1351 EKG without acute ischemia there is borderline long QT with QT 462.  This is likely somewhat secondary to low magnesium and low potassium which has been repleted.  He is having no chest pain.   [WF]  1351 Patient is breathing somewhat better after magnesium and DuoNeb.  No wheezing reexamination.  Patient updated on plan for admission.   [WF]    Clinical Course User Index [WF] Tedd Sias, [place]   Per oncology recommendations patient will be admitted to hospital for gentle hydration, electrolyte repletion, observation as patient does have significant anemia/possible transfusion at this time hospitalist discretion.  Ultimately he will require set up with hospice/palliative care with his discharge.  Patient is no longer wheezing on my evaluation.  I discussed admission, prognosis, has continued care with him and he is understanding of plan.  He is DNR and states he is uncertain whether he has had this paperwork formally filled out or not.  MDM Rules/Calculators/A&P                          Final Clinical Impression(s) / ED Diagnoses Final diagnoses:  Weakness  Dehydration    Rx / DC Orders ED Discharge Orders    None       Tedd Sias, [place] 06/16/20 1520    Lucrezia Starch, MD 06/18/20 0953    Tedd Sias, PA 07/11/20 1405    Lucrezia Starch,  MD 07/22/20 [phone]

## 2020-06-16 NOTE — Progress Notes (Signed)
[facility])    Referral for hospice received from Dr. Hazeline Junker office.  Noted pt is in ED.  Spoke with son Corene Cornea, he brought pt to ED after several days of decreased PO intake and inability to get up. Corene Cornea would like him to be medically optimized (feels he is likely dehydrated) and then begin hospice when he discharges.  [facility] will order necessary DME (bed, 3n1, O2) for when he discharges after this admission.  Please call with any questions or concerns.  Venia Carbon RN, BSN, [facility] Liaison (epic chat or # in [place])

## 2020-06-16 NOTE — ED Triage Notes (Addendum)
Pt from home- c/o worsening weakness "for some time." Also c/o SHOB, productive cough x2-3 weeks.   Pt also reports poor PO intake x1 week.

## 2020-06-16 NOTE — H&P (Addendum)
History and Physical    Anthony Escobar DPO:[id number] DOB: 09-01-1951 DOA: 06/16/2020  PCP: [name], MD  Patient coming from: home  I have personally briefly reviewed patient's old medical records in Anthony Escobar  Chief Complaint: progressive weakness  HPI: TEE Anthony Escobar is a 69 y.o. male with medical history significant of metastatic prostate cancer involving the bones and liver, pathologic fracture of the right hip status post IM nail fixation, COPD, hypertension, hyperlipidemia presented to the ED today due to progressive generalized weakness and poor oral intake.  Patient is being followed by [facility] Palliative as outpatient and oncology has been trying to get him set up with hospice.  When seen at bedside, patient reports uncontrolled pain in his right hip, left knee and right upper quadrant.  He denies fevers or chills.  Endorses shortness of breath on exertion.  Denies chest pain, leg swelling or palpitations.  He reports having very little pain at rest, but has significant pain with any movement.  Reports he has a superficial "bed rash" on his left buttock because he has been very immobile for the past 6 months.  Of note, patient's granddaughter lives with him (she is an Therapist, sports in the ED at [employer]).  She reports that patient has recently been refusing morphine the past 2 days, and has had irritability and mood lability she attributes to withdrawal, but he has not complained of increased pain.  Patient states very poor sleep because pain wakes him from sleep if he tries to rollover.  He otherwise denies recent illnesses including cough, sore throat, headaches, dysuria or urinary frequency, nausea vomiting or diarrhea.   ED Course: Tachycardic at 106 bpm with otherwise normal vital signs.  CBC shows a white count of 41.9 (was 14.5 on 06/05/2020), hemoglobin 7.7 (from 8.6), thrombocytosis with platelets 404.  Chemistries showed mild hyponatremia 134, hypokalemia 3.2,  hypomagnesemia 1.6.  LFTs are elevated but improved since 6/10.  Urinalysis was negative for infection.  Chest x-ray showed trace bilateral pleural effusions but no infiltrates.  Potassium and magnesium were replaced in the ED and patient admitted to hospitalist service for observation.  Hospice is aware he is here and following.  Review of Systems: As per HPI otherwise 10 point review of systems negative.    Past Medical History:  Diagnosis Date  . Cancer of lung ([place])   . Dyspnea   . Hyperlipidemia   . Hypertension   . Prostate cancer [facility])     Past Surgical History:  Procedure Laterality Date  . HERNIA REPAIR  1988   bilateral inguinal  . INTRAMEDULLARY (IM) NAIL INTERTROCHANTERIC Right 10/22/2019   Procedure: RIGHT INTERTROCHANTERIC INTRAMEDULLARY (IM) NAIL;  Surgeon: Leandrew Koyanagi, MD;  Location: [place];  Service: Orthopedics;  Laterality: Right;  . IR IMAGING GUIDED PORT INSERTION  04/23/2020     reports that he quit smoking about 8 years ago. His smoking use included cigarettes. He has a 75.00 pack-year smoking history. He has never used smokeless tobacco. He reports current alcohol use. He reports that he does not use drugs.  No Known Allergies  Family History  Problem Relation Age of Onset  . Stroke Mother   . Cancer Father        esophageal  . Cancer Paternal Grandmother 31       unknown  . Prostate cancer Neg Hx   . Breast cancer Neg Hx   . Colon cancer Neg Hx      Prior  to Admission medications   Medication Sig Start Date End Date Taking? Authorizing Provider  albuterol (PROAIR HFA) 108 (90 Base) MCG/ACT inhaler Inhale 2 puffs into the lungs every 4 (four) hours as needed for wheezing or shortness of breath. 03/14/18   Collene Gobble, MD  amitriptyline (ELAVIL) 75 MG tablet Take 75 mg by mouth at bedtime.    [provider]  cholecalciferol (VITAMIN D3) 25 MCG (1000 UT) tablet Take 1,000 Units by mouth daily.    [provider]  Coenzyme Q10  (COQ10 PO) Take 1 tablet by mouth daily.    [provider]  enzalutamide Gillermina Phy) 40 MG capsule Take 4 capsules (160 mg total) by mouth daily. Patient not taking: Reported on 05/11/2020 03/27/20   [name], MD  feeding supplement, ENSURE ENLIVE, (ENSURE ENLIVE) LIQD Take 237 mLs by mouth 2 (two) times daily between meals. 05/13/20   [place] Shi, MD  furosemide (LASIX) 20 MG tablet TAKE 1 TABLET(20 MG) BY MOUTH DAILY 05/30/20   [name], MD  HYDROmorphone (DILAUDID) 4 MG tablet Take 1 tablet (4 mg total) by mouth every 4 (four) hours as needed for severe pain. 06/05/20   [name], MD  ibuprofen (ADVIL) 200 MG tablet Take 400 mg by mouth every 6 (six) hours as needed for moderate pain.    [provider]  morphine (MS CONTIN) 30 MG 12 hr tablet Take 1 tablet (30 mg total) by mouth in the morning, at noon, and at bedtime. 06/05/20   [name], MD  Multiple Vitamin (MULTIVITAMIN) tablet Take 1 tablet by mouth daily.    [provider]  prochlorperazine (COMPAZINE) 10 MG tablet Take 1 tablet (10 mg total) by mouth every 6 (six) hours as needed for nausea or vomiting. 04/16/20   [name], MD  rivaroxaban (XARELTO) 10 MG TABS tablet Take 1 tablet (10 mg total) by mouth daily. Patient not taking: Reported on 04/18/2020 11/08/19   Leandrew Koyanagi, MD  Tiotropium Bromide Monohydrate (SPIRIVA RESPIMAT) 2.5 MCG/ACT AERS Inhale 2 puffs into the lungs daily. 12/15/18   Collene Gobble, MD    Physical Exam: Vitals:   06/16/20 1230 06/16/20 1330 06/16/20 1400 06/16/20 1428  BP: 120/68 119/73 119/69 (!) 113/97  Pulse: (!) 110 (!) 108 (!) 107 (!) 102  Resp: 19 18 19 15   Temp:    97.9 F (36.6 C)  TempSrc:    Oral  SpO2: 93% 94% 95% 96%  Weight:      Height:         Constitutional: NAD, calm, cachectic, frail, appears uncomfortable Eyes: EOMI, lids and conjunctivae normal, wearing glasses ENMT: Mucous membranes are dry. Posterior pharynx clear of  any exudate or lesions.Normal dentition.  Hearing grossly normal.  Respiratory: CTAB, no wheezing, no crackles. Normal respiratory effort. No accessory muscle use.  Cardiovascular: RRR, no murmurs / rubs / gallops. No extremity edema. 2+ pedal pulses.  Abdomen: soft, sunken abdomen, RUQ is tender on very light palpation, no distention, +Bowel sounds.  Musculoskeletal: no clubbing / cyanosis. No joint deformity upper and lower extremities. Normal muscle tone.  Skin: dry, intact, pale, normal temperature Neurologic: CN 2-12 grossly intact. Normal speech.  Grossly non-focal exam. Psychiatric: Alert and oriented x 3. Normal mood. Congruent affect.  Normal judgement and insight.    Labs on Admission: I have personally reviewed following labs and imaging studies  CBC: Recent Labs  Lab 06/16/20 1113  WBC 41.9*  NEUTROABS 34.9*  HGB 7.7*  HCT 23.4*  MCV 103.1*  PLT 500*   Basic Metabolic Panel: Recent Labs  Lab 06/16/20 1113  NA 134*  K 3.2*  CL 98  CO2 26  GLUCOSE 99  BUN 10  CREATININE 0.77  CALCIUM 7.5*  MG 1.6*   GFR: Estimated Creatinine Clearance: 76.5 mL/min (by C-G formula based on SCr of 0.77 mg/dL). Liver Function Tests: Recent Labs  Lab 06/16/20 1113  AST 62*  ALT 22  ALKPHOS 586*  BILITOT 0.9  PROT 4.8*  ALBUMIN 2.1*   No results for input(s): LIPASE, AMYLASE in the last 168 hours. No results for input(s): AMMONIA in the last 168 hours. Coagulation Profile: No results for input(s): INR, PROTIME in the last 168 hours. Cardiac Enzymes: No results for input(s): CKTOTAL, CKMB, CKMBINDEX, TROPONINI in the last 168 hours. BNP (last 3 results) No results for input(s): PROBNP in the last 8760 hours. HbA1C: No results for input(s): HGBA1C in the last 72 hours. CBG: No results for input(s): GLUCAP in the last 168 hours. Lipid Profile: No results for input(s): CHOL, HDL, LDLCALC, TRIG, CHOLHDL, LDLDIRECT in the last 72 hours. Thyroid Function Tests: No  results for input(s): TSH, T4TOTAL, FREET4, T3FREE, THYROIDAB in the last 72 hours. Anemia Panel: No results for input(s): VITAMINB12, FOLATE, FERRITIN, TIBC, IRON, RETICCTPCT in the last 72 hours. Urine analysis:    Component Value Date/Time   COLORURINE YELLOW 06/16/2020 1244   APPEARANCEUR CLEAR 06/16/2020 1244   LABSPEC 1.015 06/16/2020 1244   PHURINE 7.0 06/16/2020 [place] 06/16/2020 [place] 06/16/2020 1244   [place] 06/16/2020 1244   KETONESUR 5 (A) 06/16/2020 1244   PROTEINUR 30 (A) 06/16/2020 1244   NITRITE NEGATIVE 06/16/2020 1244   LEUKOCYTESUR NEGATIVE 06/16/2020 1244    Radiological Exams on Admission: DG Chest 2 View  Result Date: 06/16/2020 CLINICAL DATA:  69 year old male with history of weakness, shortness of breath and decreased p.o. intake over the past 2 weeks. EXAM: CHEST - 2 VIEW COMPARISON:  Chest x-ray 10/19/2019. FINDINGS: Lung volumes are normal. No consolidative airspace disease. Trace bilateral pleural effusions. No pneumothorax. No pulmonary nodule or mass noted. Pulmonary vasculature and the cardiomediastinal silhouette are within normal limits. Atherosclerosis in the thoracic aorta. Right subclavian single-lumen porta cath with tip terminating in the superior cavoatrial junction. IMPRESSION: 1. Trace bilateral pleural effusions. 2. Aortic atherosclerosis. Electronically Signed   By: Vinnie Langton M.D.   On: 06/16/2020 12:47    EKG: Independently reviewed.  Sinus tachycardia, 105 bpm, borderline right axis deviation, QTC 479, no ST elevation.  Assessment/Plan Principal Problem:   Generalized weakness Active Problems:   Prostate cancer metastatic to bone (HCC)   Hypokalemia   Hypomagnesemia   Essential hypertension   HLD (hyperlipidemia)   Dry mouth    Generalized weakness -due to metastatic disease and electrolyte abnormalities. --PT and OT evaluations once pain is controlled  Prostate cancer  metastatic to bone and liver - follows with oncologist, Dr. Alen Blew.  Last follow up on 05/15/20, on chemotherapy.  Was started on MS Contin due to bone pain uncontrolled on oral Dilaudid every 4 hours.  Therapy at this point is palliative in nature.  Patient followed by [facility] Palliative, transitioning to hospice now. --Spoke to [employer], they are aware on his admission and  --TOC consulted --Pain control per orders, titrate for adequate pain relief --Bowel regimen  Hypokalemia -POA, K3.2.  Replaced in the ED.  Repeat BMP in AM.  Replace as needed.  Hypomagnesemia -POA, Mg 1.6.  Replaced in the ED.  Repeat Mg level in AM.  Further replacement as needed.  Elevated LFT's - POA, due to liver mets.  Improved from 06/05/20 labs.  Monitor.   Pressure injury of skin - POA, per patient, located on left buttock.  I was unable to visualize it due to patient's pain limiting his ability to roll over in bed.  WOC consulted.  Leukocytosis - no evidence of infection at this time, UA and CXR were negative.  No fevers or chills.  Monitor for signs or symptoms of infection.  Blood cultures ordered.  Defer antibiotics and monitor.   Macrocytic Anemia - subacute, POA with Hbg 7.7.  During admission last month, patient had drop in Hbg from 13 to nadir 8.7 with improvement to 9's, in addition to positive FOBT.  Discharge summary reviewed.  Patient was on Xarelto for ?DVT ppx which was held and then resumed on discharge.  GI follow up was planned and Heme/oncology follow up to determine need to continue Xarelto.  Hold Xarelto.  Monitor CBC.    Essential hypertension -chronic, stable.  Appears his lisinopril was stopped on discharge last month.  BP is normal.  Monitor.  Hyperlipidemia -continue statin   COPD -not acutely exacerbated.  Continue home regimen.  Depression -continue home Elavil  Dry mouth - Biotene ordered   DVT prophylaxis: SCDs Start: 06/16/20 1327   Code Status: LIMITED - 2 cycles of  CPR then stop if no ROSC. Discussed in detail at bedside with patient and son present.  Patient requested that a short attempt at resuscitation be made in the event of cardiac arrest, no longer than 2-4 minutes of CPR.  Patient to continue ongoing goals of care and code status discussions with family.   Family Communication: son at bedside during end of encounter.  Also spoke with patient's granddaughter (RN [place] in ED) who has been his caregiver at home.  All are in agreement with plan to go home with hospice. Disposition Plan: d/c home with hospice likely tomorrow, or when pain adequately controlled.   Consults called: Hospice liason   Admission status:  Status is: Observation  The patient remains OBS appropriate and will d/c before 2 midnights.  Dispo: The patient is from: Home              Anticipated d/c is to: Home with hospice              Anticipated d/c date is: 1 day              Patient currently is not medically stable to d/c.    Ezekiel Slocumb, DO [facility]  06/16/2020, 2:37 PM    If 7PM-7AM, please contact night-coverage. How to contact the [facility] Attending or Consulting provider [place] or covering provider during after hours [place], for this patient?    1. Check the care team in [facility] and look for a) attending/consulting TRH provider listed and b) the [facility] team listed 2. Log into [email or web address] and use [place]'s universal password to access. If you do not have the password, please contact the hospital operator. 3. Locate the [facility] provider you are looking for under [facility] and page to a number that you can be directly reached. 4. If you still have difficulty reaching the provider, please page the [facility] (Director on Call) for the Hospitalists listed on amion for assistance.

## 2020-06-16 NOTE — ED Notes (Signed)
Urinal at bedside.  

## 2020-06-17 DIAGNOSIS — L89322 Pressure ulcer of left buttock, stage 2: Secondary | ICD-10-CM | POA: Diagnosis present

## 2020-06-17 DIAGNOSIS — L8990 Pressure ulcer of unspecified site, unspecified stage: Secondary | ICD-10-CM | POA: Insufficient documentation

## 2020-06-17 DIAGNOSIS — Y92009 Unspecified place in unspecified non-institutional (private) residence as the place of occurrence of the external cause: Secondary | ICD-10-CM | POA: Diagnosis not present

## 2020-06-17 DIAGNOSIS — C7951 Secondary malignant neoplasm of bone: Secondary | ICD-10-CM | POA: Diagnosis present

## 2020-06-17 DIAGNOSIS — R64 Cachexia: Secondary | ICD-10-CM | POA: Diagnosis present

## 2020-06-17 DIAGNOSIS — C787 Secondary malignant neoplasm of liver and intrahepatic bile duct: Secondary | ICD-10-CM | POA: Diagnosis present

## 2020-06-17 DIAGNOSIS — Z87891 Personal history of nicotine dependence: Secondary | ICD-10-CM | POA: Diagnosis not present

## 2020-06-17 DIAGNOSIS — C61 Malignant neoplasm of prostate: Secondary | ICD-10-CM | POA: Diagnosis present

## 2020-06-17 DIAGNOSIS — E876 Hypokalemia: Secondary | ICD-10-CM | POA: Diagnosis present

## 2020-06-17 DIAGNOSIS — R627 Adult failure to thrive: Secondary | ICD-10-CM | POA: Diagnosis present

## 2020-06-17 DIAGNOSIS — E871 Hypo-osmolality and hyponatremia: Secondary | ICD-10-CM | POA: Diagnosis present

## 2020-06-17 DIAGNOSIS — J449 Chronic obstructive pulmonary disease, unspecified: Secondary | ICD-10-CM | POA: Diagnosis present

## 2020-06-17 DIAGNOSIS — G893 Neoplasm related pain (acute) (chronic): Secondary | ICD-10-CM | POA: Diagnosis present

## 2020-06-17 DIAGNOSIS — D539 Nutritional anemia, unspecified: Secondary | ICD-10-CM | POA: Diagnosis present

## 2020-06-17 DIAGNOSIS — I10 Essential (primary) hypertension: Secondary | ICD-10-CM

## 2020-06-17 DIAGNOSIS — Z7901 Long term (current) use of anticoagulants: Secondary | ICD-10-CM | POA: Diagnosis not present

## 2020-06-17 DIAGNOSIS — E785 Hyperlipidemia, unspecified: Secondary | ICD-10-CM | POA: Diagnosis present

## 2020-06-17 DIAGNOSIS — F329 Major depressive disorder, single episode, unspecified: Secondary | ICD-10-CM | POA: Diagnosis present

## 2020-06-17 DIAGNOSIS — E860 Dehydration: Secondary | ICD-10-CM | POA: Diagnosis present

## 2020-06-17 DIAGNOSIS — D72829 Elevated white blood cell count, unspecified: Secondary | ICD-10-CM | POA: Diagnosis present

## 2020-06-17 DIAGNOSIS — Z66 Do not resuscitate: Secondary | ICD-10-CM | POA: Diagnosis present

## 2020-06-17 DIAGNOSIS — Z6821 Body mass index (BMI) 21.0-21.9, adult: Secondary | ICD-10-CM | POA: Diagnosis not present

## 2020-06-17 DIAGNOSIS — Z20822 Contact with and (suspected) exposure to covid-19: Secondary | ICD-10-CM | POA: Diagnosis present

## 2020-06-17 DIAGNOSIS — Z79899 Other long term (current) drug therapy: Secondary | ICD-10-CM | POA: Diagnosis not present

## 2020-06-17 DIAGNOSIS — T402X6A Underdosing of other opioids, initial encounter: Secondary | ICD-10-CM | POA: Diagnosis present

## 2020-06-17 LAB — COMPREHENSIVE METABOLIC PANEL
ALT: 21 U/L (ref 0–44)
AST: 60 U/L — ABNORMAL HIGH (ref 15–41)
Albumin: 1.9 g/dL — ABNORMAL LOW (ref 3.5–5.0)
Alkaline Phosphatase: 551 U/L — ABNORMAL HIGH (ref 38–126)
Anion gap: 9 (ref 5–15)
BUN: 7 mg/dL — ABNORMAL LOW (ref 8–23)
CO2: 26 mmol/L (ref 22–32)
Calcium: 7.5 mg/dL — ABNORMAL LOW (ref 8.9–10.3)
Chloride: 99 mmol/L (ref 98–111)
Creatinine, Ser: 0.8 mg/dL (ref 0.61–1.24)
GFR calc Af Amer: >60 mL/min (ref >60)
GFR calc non Af Amer: >60 mL/min (ref >60)
Glucose, Bld: 87 mg/dL (ref 70–99)
Potassium: 3.5 mmol/L (ref 3.5–5.1)
Sodium: 134 mmol/L — ABNORMAL LOW (ref 135–145)
Total Bilirubin: 0.9 mg/dL (ref 0.3–1.2)
Total Protein: 4.7 g/dL — ABNORMAL LOW (ref 6.5–8.1)

## 2020-06-17 LAB — CBC
HCT: 23.6 % — ABNORMAL LOW (ref 39.0–52.0)
Hemoglobin: 7.5 g/dL — ABNORMAL LOW (ref 13.0–17.0)
MCH: 33.6 pg (ref 26.0–34.0)
MCHC: 31.8 g/dL (ref 30.0–36.0)
MCV: 105.8 fL — ABNORMAL HIGH (ref 80.0–100.0)
Platelets: 417 10*3/uL — ABNORMAL HIGH (ref 150–400)
RBC: 2.23 MIL/uL — ABNORMAL LOW (ref 4.22–5.81)
RDW: 17.7 % — ABNORMAL HIGH (ref 11.5–15.5)
WBC: 36.3 10*3/uL — ABNORMAL HIGH (ref 4.0–10.5)
nRBC: 0.1 % (ref 0.0–0.2)

## 2020-06-17 MED ORDER — POLYETHYLENE GLYCOL 3350 17 G PO PACK
17.0000 g | PACK | Freq: Two times a day (BID) | ORAL | Status: DC
  Administered 2020-06-17 – 2020-06-18 (×3): 17 g via ORAL
  Filled 2020-06-17 (×3): qty 1

## 2020-06-17 MED ORDER — MORPHINE SULFATE ER 30 MG PO TBCR
60.0000 mg | EXTENDED_RELEASE_TABLET | Freq: Two times a day (BID) | ORAL | Status: DC
  Administered 2020-06-17 – 2020-06-18 (×3): 60 mg via ORAL
  Filled 2020-06-17 (×3): qty 2

## 2020-06-17 MED ORDER — CHLORHEXIDINE GLUCONATE CLOTH 2 % EX PADS
6.0000 | MEDICATED_PAD | Freq: Every day | CUTANEOUS | Status: DC
  Administered 2020-06-17 – 2020-06-18 (×2): 6 via TOPICAL

## 2020-06-17 MED ORDER — SENNOSIDES-DOCUSATE SODIUM 8.6-50 MG PO TABS
2.0000 | ORAL_TABLET | Freq: Two times a day (BID) | ORAL | Status: DC
  Administered 2020-06-17 – 2020-06-18 (×3): 2 via ORAL
  Filled 2020-06-17 (×3): qty 2

## 2020-06-17 MED ORDER — ZOLPIDEM TARTRATE 5 MG PO TABS
5.0000 mg | ORAL_TABLET | Freq: Once | ORAL | Status: AC
  Administered 2020-06-17: 5 mg via ORAL
  Filled 2020-06-17: qty 1

## 2020-06-17 NOTE — Progress Notes (Signed)
Events noted.  Patient known to me with a history of advanced prostate cancer currently approaching end-stage status.  Is hospitalized with failure to thrive increased pain.  The plan prior to admission was to stop all cancer treatment and transition to hospice.  I agree with the current management plan at this point with dissipated discharge to home with hospice.  He has limited life expectancy and agree with DNR status.

## 2020-06-17 NOTE — Evaluation (Signed)
Occupational Therapy Evaluation Patient Details Name: Anthony Escobar MRN: [id number] DOB: 12/28/1950 Today's Date: 06/17/2020    History of Present Illness Anthony Escobar is a 69 y.o. male with medical history significant of metastatic prostate cancer involving the bones and liver, pathologic fracture of the right hip status post IM nail fixation, COPD, hypertension, hyperlipidemia presented to the ED today due to progressive generalized weakness and poor oral intake.  Patient is being followed by [facility] Palliative as outpatient and oncology has been trying to get him set up with hospice.   Clinical Impression   [name] is a 69 year old man with metastatic prostate cancer admitted to hospital with generalized weakness. On evaluation patient presents with generalized weakness and decreased activity tolerance needing min guard for ambulation, bed elevation for sit to stand and assistance with lower body dressing. Patient will benefit from skilled OT services while in hospital in order to improve deficits and maintain abilities in order to return home at discharge. Patient reports feeling better and closer to his baseline. Reports his goal is to go home with family.    Follow Up Recommendations  No OT follow up    Equipment Recommendations   (Recommended use of shower chair and elevated toilet seat at home.)    Recommendations for Other Services       Precautions / Restrictions Precautions Precautions: Fall Restrictions Weight Bearing Restrictions: No      Mobility Bed Mobility Overal bed mobility: Modified Independent             General bed mobility comments: Patient used bed rails and needed increased time but no physical assistance.  Transfers Overall transfer level: Needs assistance Equipment used: Rolling walker (2 wheeled) Transfers: Sit to/from Stand Sit to Stand: Min guard         General transfer comment: Patient min guard to stand from  elevated bed surface. Min guard to ambulate in room and stand at sink for safety. Patient reporting feeling more at his baseline in regards to mobility.    Balance Overall balance assessment: No apparent balance deficits (not formally assessed)                                         ADL either performed or assessed with clinical judgement   ADL Overall ADL's : Needs assistance/impaired Eating/Feeding: Independent   Grooming: Wash/dry hands;Standing;Supervision/safety Grooming Details (indicate cue type and reason): Patient stood at sink to wash hands. Upper Body Bathing: Set up;Sitting   Lower Body Bathing: Minimal assistance;Sit to/from stand Lower Body Bathing Details (indicate cue type and reason): Min assist for lower body bathing secondary to limited ability to bend over. Upper Body Dressing : Set up;Sitting   Lower Body Dressing: Maximal assistance Lower Body Dressing Details (indicate cue type and reason): Max assist to donn socks and shorts. Patient reports typically not wearing socks and wearing slip on shoes. Patient needed assistance to donn shorts over feet but able to pull up over hips from knee level. Patient typically uses reacher for donning shorts. Toilet Transfer: RW;Regular Toilet;Grab bars;Min guard   Toileting- Water quality scientist and Hygiene: Min guard;Sit to/from Nurse, children's Details (indicate cue type and reason): n/a Functional mobility during ADLs: Min guard General ADL Comments: min guard to ambulate in room and during ADLs     Vision Patient Visual Report: No change  from baseline Vision Assessment?: No apparent visual deficits     Perception     Praxis      Pertinent Vitals/Pain Pain Assessment:  (No pain at rest.)     Hand Dominance Right   Extremity/Trunk Assessment Upper Extremity Assessment Upper Extremity Assessment: Overall WFL for tasks assessed   Lower Extremity Assessment Lower Extremity  Assessment: Defer to PT evaluation   Cervical / Trunk Assessment Cervical / Trunk Assessment: Normal   Communication Communication Communication: No difficulties   Cognition Arousal/Alertness: Awake/alert Behavior During Therapy: WFL for tasks assessed/performed Overall Cognitive Status: Within Functional Limits for tasks assessed                                     General Comments       Exercises     Shoulder Instructions      Home Living Family/patient expects to be discharged to:: Private residence Living Arrangements: Other relatives Advertising account executive) Available Help at Discharge: Family;Available PRN/intermittently   Home Access: Stairs to enter Entrance Stairs-Number of Steps: 4 Entrance Stairs-Rails: Right Home Layout: One level     Bathroom Shower/Tub: Tub/shower unit;Walk-in shower   Bathroom Toilet: Standard Bathroom Accessibility: Yes How Accessible: Accessible via walker Home Equipment: Walker - 2 wheels;Crutches;Other (comment);Shower seat (long handled reacher, lift chair)   Additional Comments: Reports he will be getting his steps "redesigned" in order to get into his house and getting a higher toilet.      Prior Functioning/Environment Level of Independence: Independent with assistive device(s)        Comments: Used a RW to ambulate. Uses a LHR to assist with lower body dressing.        OT Problem List: Decreased strength;Decreased activity tolerance;Pain      OT Treatment/Interventions: Self-care/ADL training;Patient/family education;DME and/or AE instruction;Therapeutic exercise    OT Goals(Current goals can be found in the care plan section) Acute Rehab OT Goals Patient Stated Goal: To go home with increased independence OT Goal Formulation: With patient Time For Goal Achievement: 06/26/20 Potential to Achieve Goals: Fair  OT Frequency: Min 2X/week   Barriers to D/C:            Co-evaluation               AM-PAC OT "6 Clicks" Daily Activity     Outcome Measure Help from another person eating meals?: None Help from another person taking care of personal grooming?: A Little Help from another person toileting, which includes using toliet, bedpan, or urinal?: A Little Help from another person bathing (including washing, rinsing, drying)?: A Little Help from another person to put on and taking off regular upper body clothing?: None Help from another person to put on and taking off regular lower body clothing?: A Lot 6 Click Score: 19   End of Session Equipment Utilized During Treatment: Gait belt;Rolling walker Nurse Communication:  (okay to see per Rn)  Activity Tolerance: Patient tolerated treatment well Patient left: in bed;with call bell/phone within reach;with bed alarm set  OT Visit Diagnosis: Pain;Muscle weakness (generalized) (M62.81) Pain - Right/Left: Right Pain - part of body: Hip                Time: 7628-3151 OT Time Calculation (min): 26 min Charges:  OT General Charges $OT Visit: 1 Visit OT Evaluation $OT Eval Low Complexity: 1 Low  Zavier Canela, OTR/L [place]  Office [phone] Pager:  [place] 06/17/2020, 9:26 AM

## 2020-06-17 NOTE — Evaluation (Signed)
Physical Therapy Evaluation Patient Details Name: Anthony Escobar MRN: [id number] DOB: March 15, 1951 Today's Date: 06/17/2020   History of Present Illness  69 y.o. male with medical history significant of metastatic prostate cancer involving the bones and liver, pathologic fracture of the right hip status post IM nail fixation, COPD, hypertension, hyperlipidemia presented to the ED today due to progressive generalized weakness and poor oral intake.  Patient is being followed by [facility] Palliative as outpatient and oncology has been trying to get him set up with hospice.  Clinical Impression  On eval, pt was Supv level for mobility. He walked around the room ~60 feet with a RW. Pain rated 0 at rest and 4/10 with activity. Pt stated he was currently pleased with his pain control and being able to get some rest. Plan is for him to return home with family assisting as needed and hospice care. Will follow during hospital stay.     Follow Up Recommendations Supervision - Intermittent (plan is for home hospice)    Equipment Recommendations  None recommended by PT (pt stated his son is assisting with getting DME for home)    Recommendations for Other Services       Precautions / Restrictions Precautions Precautions: Fall Restrictions Weight Bearing Restrictions: No      Mobility  Bed Mobility Overal bed mobility: Modified Independent                Transfers Overall transfer level: Needs assistance Equipment used: Rolling walker (2 wheeled) Transfers: Sit to/from Stand Sit to Stand: Supervision;From elevated surface            Ambulation/Gait Ambulation/Gait assistance: Supervision Gait Distance (Feet): 60 Feet Assistive device: Rolling walker (2 wheeled) Gait Pattern/deviations: Step-through pattern;Decreased stride length     General Gait Details: Pt took laps around room. He tolerated activity well  Stairs            Wheelchair Mobility    Modified  [place] (Stroke Patients Only)       Balance Overall balance assessment: Mild deficits observed, not formally tested                                           Pertinent Vitals/Pain Pain Assessment: 0-10 Pain Score: 4  Pain Location: R hip, L knee Pain Descriptors / Indicators: Discomfort;Sore Pain Intervention(s): Limited activity within patient's tolerance;Monitored during session;Repositioned    Home Living Family/patient expects to be discharged to:: Private residence Living Arrangements: Other relatives Available Help at Discharge: Family;Available PRN/intermittently Type of Home: House Home Access: Stairs to enter Entrance Stairs-Rails: Right Entrance Stairs-Number of Steps: 4 Home Layout: One level Home Equipment: Walker - 2 wheels;Crutches;Other (comment);Shower seat      Prior Function Level of Independence: Independent with assistive device(s)         Comments: Used a RW to ambulate. Uses a LHR to assist with lower body dressing.     Hand Dominance   Dominant Hand: Right    Extremity/Trunk Assessment   Upper Extremity Assessment Upper Extremity Assessment: Defer to OT evaluation    Lower Extremity Assessment Lower Extremity Assessment: Generalized weakness    Cervical / Trunk Assessment Cervical / Trunk Assessment: Normal  Communication   Communication: No difficulties  Cognition Arousal/Alertness: Awake/alert Behavior During Therapy: WFL for tasks assessed/performed Overall Cognitive Status: Within Functional Limits for tasks assessed  General Comments      Exercises     Assessment/Plan    PT Assessment Patient needs continued PT services  PT Problem List Decreased mobility;Pain;Decreased balance;Decreased activity tolerance       PT Treatment Interventions DME instruction;Gait training;Therapeutic activities;Therapeutic exercise;Patient/family education;Balance  training;Functional mobility training    PT Goals (Current goals can be found in the Care Plan section)  Acute Rehab PT Goals Patient Stated Goal: To go home with increased independence PT Goal Formulation: With patient Time For Goal Achievement: 07/01/20 Potential to Achieve Goals: Fair    Frequency Min 3X/week   Barriers to discharge        Co-evaluation               AM-PAC PT "6 Clicks" Mobility  Outcome Measure Help needed turning from your back to your side while in a flat bed without using bedrails?: None Help needed moving from lying on your back to sitting on the side of a flat bed without using bedrails?: None Help needed moving to and from a bed to a chair (including a wheelchair)?: A Little Help needed standing up from a chair using your arms (e.g., wheelchair or bedside chair)?: A Little Help needed to walk in hospital room?: A Little Help needed climbing 3-5 steps with a railing? : A Little 6 Click Score: 20    End of Session   Activity Tolerance: Patient tolerated treatment well Patient left: in bed;with call bell/phone within reach   PT Visit Diagnosis: Unsteadiness on feet (R26.81);Muscle weakness (generalized) (M62.81)    Time: 4166-0630 PT Time Calculation (min) (ACUTE ONLY): 14 min   Charges:   PT Evaluation $PT Eval Low Complexity: [place], PT Acute Rehabilitation  Office: [phone] Pager: [phone]

## 2020-06-17 NOTE — Consult Note (Signed)
[place] Nurse wound consult note Consultation was completed by review of records, images and assistance from the bedside nurse/clinical staff.  Reason for Consult: pressure injuries Wound type: Stage 2 pressure injury Pressure Injury POA: Yes Measurement: see nursing flow sheet Wound VVY:XAJLU, pink Drainage (amount, consistency, odor) see nursing FS Periwound; intact  Dressing procedure/placement/frequency: Continue silicone foam per skin care order set.  Patient to dc with hospice care.    Re consult if needed, will not follow at this time. Thanks  Emri Sample [employer], RN,CWOCN, CNS, [place] [phone])

## 2020-06-17 NOTE — Progress Notes (Signed)
[facility] PROGRESS NOTE   Anthony Escobar FTD:[id number] DOB: [birth year] DOA: 06/16/2020  PCP: [name], MD  Brief History/Interval Summary: 69 y.o. male with medical history significant of metastatic prostate cancer involving the bones and liver, pathologic fracture of the right hip status post IM nail fixation, COPD, hypertension, hyperlipidemia presented to the ED due to progressive generalized weakness and poor oral intake.  Patient is being followed by [facility] Palliative as outpatient and oncology has been trying to get him set up with hospice.    Patient also mention uncontrolled pain in his right hip left knee and right upper quadrant.  He has been taking his MS Contin without much relief.  Had a bowel movement on June 21.    Reason for Visit: Uncontrolled cancer related pain  Consultants: Oncology has been notified.  Hospice is following.  Procedures: None  Antibiotics: Anti-infectives (From admission, onward)   None      Subjective/Interval History: Patient mentions that he has severe pain in his right hip and left knee.  8 out of 10 in intensity.  He has been taking the MS Contin without much relief.  Also on as needed Dilaudid.  Had a bowel movement yesterday.  Denies any shortness of breath.  ROS: No nausea or vomiting.    Assessment/Plan:  Uncontrolled cancer associated pain Patient's pain is very poorly controlled.  We will increase the dose of his MS Contin.  Add bowel regimen.  He did have a bowel movement yesterday.  Resume his Dilaudid instead of the oxycodone.  Metastatic prostate cancer Follows with Dr. Alen Blew.  Patient is reaching end-stage disease.  Does not have any other treatment options left.  His oral intake has been poor which could be due to pain.  Prognosis is poor.  Likely does not have more than a few weeks.  Plan is for him to go home when pain is adequately controlled with hospice services to follow  there.  Hypokalemia/hypomagnesemia Possibly due to poor nutrition.  Potassium is improved.  Recheck levels tomorrow.  Abnormal LFTs Due to liver mets.  Pressure injury of skin, present on admission Located in the left buttock.  Leukocytosis No evidence for infection.  Afebrile.  Hold off on antibiotics.  Macrocytic anemia Hemoglobin is low but stable.  No evidence for overt bleeding.  Noted to be on Xarelto Patient was initially on Lovenox after he underwent orthopedic surgery in October 2020.  He was subsequently changed over to [place] in November.  No DVT noted on Doppler studies done in May 2021.  At this time we can discontinue Xarelto since patient is transitioning to hospice.  Plus patient not sure if he is still taking this medication or not.  Essential hypertension Blood pressure is reasonably well controlled.  Lisinopril was discontinued on discharge last month.  Hyperlipidemia Noted to be on statin.  Will hold it for now due to abnormal LFTs.  History of depression Noted to be on Elavil.  DVT Prophylaxis: SCDs Code Status: DNR Family Communication: No family at bedside Disposition Plan:  Status is: Observation  The patient will require care spanning > 2 midnights and should be moved to inpatient because: Ongoing active pain requiring inpatient pain management  Dispo: The patient is from: Home              Anticipated d/c is to: Home              Anticipated d/c date is: 1 day  Patient currently is not medically stable to d/c.     Medications:  Scheduled:  Chlorhexidine Gluconate Cloth  6 each Topical Daily   morphine  60 mg Oral Q12H   polyethylene glycol  17 g Oral BID   senna-docusate  2 tablet Oral BID   umeclidinium bromide  1 puff Inhalation Daily   Continuous:  lactated ringers 75 mL/hr at 06/17/20 0427   QMV:HQIONGEXBMWUX **OR** acetaminophen, albuterol, antiseptic oral rinse, bisacodyl, HYDROmorphone (DILAUDID) injection,  ondansetron **OR** ondansetron (ZOFRAN) IV, oxyCODONE   Objective:  Vital Signs  Vitals:   06/16/20 1330 06/16/20 1400 06/16/20 1428 06/17/20 0719  BP: 119/73 119/69 (!) 113/97 109/70  Pulse: (!) 108 (!) 107 (!) 102 100  Resp: 18 19 15 18   Temp:   97.9 F (36.6 C) 97.9 F (36.6 C)  TempSrc:   Oral Oral  SpO2: 94% 95% 96% 92%  Weight:   63 kg   Height:        Intake/Output Summary (Last 24 hours) at 06/17/2020 1134 Last data filed at 06/17/2020 0954 Gross per 24 hour  Intake 410 ml  Output --  Net 410 ml   Filed Weights   06/16/20 1028 06/16/20 1428  Weight: 61.2 kg 63 kg    General appearance: Awake alert.  In no distress Resp: Clear to auscultation bilaterally.  Normal effort Cardio: S1-S2 is normal regular.  No S3-S4.  No rubs murmurs or bruit GI: Abdomen is soft.  Tender in the right upper quadrant.  No rebound rigidity or guarding.  Bowel sounds are present normal.  No masses organomegaly Extremities: Restricted range of motion in the lower extremities due to metastases. Neurologic: Alert and oriented x3.  No focal neurological deficits.    Lab Results:  Data Reviewed: I have personally reviewed following labs and imaging studies  CBC: Recent Labs  Lab 06/16/20 1113 06/17/20 0525  WBC 41.9* 36.3*  NEUTROABS 34.9*  --   HGB 7.7* 7.5*  HCT 23.4* 23.6*  MCV 103.1* 105.8*  PLT 404* 417*    Basic Metabolic Panel: Recent Labs  Lab 06/16/20 1113 06/17/20 0525  NA 134* 134*  K 3.2* 3.5  CL 98 99  CO2 26 26  GLUCOSE 99 87  BUN 10 7*  CREATININE 0.77 0.80  CALCIUM 7.5* 7.5*  MG 1.6*  --     GFR: Estimated Creatinine Clearance: 78.8 mL/min (by C-G formula based on SCr of 0.8 mg/dL).  Liver Function Tests: Recent Labs  Lab 06/16/20 1113 06/17/20 0525  AST 62* 60*  ALT 22 21  ALKPHOS 586* 551*  BILITOT 0.9 0.9  PROT 4.8* 4.7*  ALBUMIN 2.1* 1.9*     Recent Results (from the past 240 hour(s))  SARS Coronavirus 2 by RT PCR (hospital order,  performed in [facility] hospital lab) Nasopharyngeal Nasopharyngeal Swab     Status: None   Collection Time: 06/16/20  1:28 PM   Specimen: Nasopharyngeal Swab  Result Value Ref Range Status   SARS Coronavirus 2 NEGATIVE NEGATIVE Final    Comment: (NOTE) SARS-CoV-2 target nucleic acids are NOT DETECTED.  The SARS-CoV-2 RNA is generally detectable in upper and lower respiratory specimens during the acute phase of infection. The lowest concentration of SARS-CoV-2 viral copies this assay can detect is 250 copies / mL. A negative result does not preclude SARS-CoV-2 infection and should not be used as the sole basis for treatment or other patient management decisions.  A negative result may occur with improper specimen collection /  handling, submission of specimen other than nasopharyngeal swab, presence of viral mutation(s) within the areas targeted by this assay, and inadequate number of viral copies (<250 copies / mL). A negative result must be combined with clinical observations, patient history, and epidemiological information.  Fact Sheet for Patients:   StrictlyIdeas.no  Fact Sheet for Healthcare Providers: BankingDealers.co.za  This test is not yet approved or  cleared by the [place] FDA and has been authorized for detection and/or diagnosis of SARS-CoV-2 by FDA under an Emergency Use Authorization (EUA).  This EUA will remain in effect (meaning this test can be used) for the duration of the COVID-19 declaration under Section 564(b)(1) of the Act, 21 U.S.C. section 360bbb-3(b)(1), unless the authorization is terminated or revoked sooner.  Performed at [facility], [place] [address], [place], [place] [address]   Culture, blood (routine x 2)     Status: None (Preliminary result)   Collection Time: 06/16/20  3:53 PM   Specimen: BLOOD  Result Value Ref Range Status   Specimen Description   Final    BLOOD  RIGHT ANTECUBITAL Performed at [facility] [address], [place], [place] [address]    Special Requests   Final    BOTTLES DRAWN AEROBIC ONLY Blood Culture adequate volume Performed at [facility] [address], [place], [place] [address]    Culture   Final    NO GROWTH < 12 HOURS Performed at [facility] [address], [place], [place] [address]    Report Status PENDING  Incomplete  Culture, blood (routine x 2)     Status: None (Preliminary result)   Collection Time: 06/16/20  3:53 PM   Specimen: BLOOD RIGHT HAND  Result Value Ref Range Status   Specimen Description   Final    BLOOD RIGHT HAND Performed at [facility] [address], [place], [place] [address]    Special Requests   Final    BOTTLES DRAWN AEROBIC ONLY Blood Culture adequate volume Performed at [facility] [address] [place], [place] [address]    Culture   Final    NO GROWTH < 12 HOURS Performed at [facility] [address] [place], [place] [address]    Report Status PENDING  Incomplete      Radiology Studies: DG Chest 2 View  Result Date: 06/16/2020 CLINICAL DATA:  69 year old male with history of weakness, shortness of breath and decreased p.o. intake over the past 2 weeks. EXAM: CHEST - 2 VIEW COMPARISON:  Chest x-ray 10/19/2019. FINDINGS: Lung volumes are normal. No consolidative airspace disease. Trace bilateral pleural effusions. No pneumothorax. No pulmonary nodule or mass noted. Pulmonary vasculature and the cardiomediastinal silhouette are within normal limits. Atherosclerosis in the thoracic aorta. Right subclavian single-lumen porta cath with tip terminating in the superior cavoatrial junction. IMPRESSION: 1. Trace bilateral pleural effusions. 2. Aortic atherosclerosis. Electronically Signed   By: Vinnie Langton M.D.   On: 06/16/2020 12:47       LOS: 0 days   [facility] Pager on [email or web address]  06/17/2020, 11:34 AM

## 2020-06-17 NOTE — Progress Notes (Addendum)
[facility])  DME ordered for anticipated discharge with hospice.  Bed, 3n1, wheelchair, O2 ordered.  Tentative d/c Wednesday pending symptoms being adequately managed.  When he does d/c, please make sure he has access/scripts for symptom management until hospice can arrive at him home.  Likely will be the same day, but just to make sure there is no lapse in his symptom management.  Thank you, Venia Carbon RN, BSN, [facility] Liaison  **DME has arrived and is set up for patient.  Family is anticipating that he will d/c Wednesday am.

## 2020-06-18 ENCOUNTER — Ambulatory Visit: Payer: Medicare Other | Admitting: Urology

## 2020-06-18 DIAGNOSIS — Z515 Encounter for palliative care: Secondary | ICD-10-CM

## 2020-06-18 LAB — BASIC METABOLIC PANEL
Anion gap: 9 (ref 5–15)
BUN: 10 mg/dL (ref 8–23)
CO2: 28 mmol/L (ref 22–32)
Calcium: 7.6 mg/dL — ABNORMAL LOW (ref 8.9–10.3)
Chloride: 96 mmol/L — ABNORMAL LOW (ref 98–111)
Creatinine, Ser: 0.79 mg/dL (ref 0.61–1.24)
GFR calc Af Amer: >60 mL/min (ref >60)
GFR calc non Af Amer: >60 mL/min (ref >60)
Glucose, Bld: 119 mg/dL — ABNORMAL HIGH (ref 70–99)
Potassium: 4 mmol/L (ref 3.5–5.1)
Sodium: 133 mmol/L — ABNORMAL LOW (ref 135–145)

## 2020-06-18 LAB — MAGNESIUM: Magnesium: 1.9 mg/dL (ref 1.7–2.4)

## 2020-06-18 MED ORDER — POLYETHYLENE GLYCOL 3350 17 G PO PACK: 17.0000 g | PACK | Freq: Two times a day (BID) | ORAL | 0 refills | Status: AC

## 2020-06-18 MED ORDER — ALUM & MAG HYDROXIDE-SIMETH 200-200-20 MG/5ML PO SUSP
30.0000 mL | ORAL | Status: DC | PRN
  Administered 2020-06-18: 30 mL via ORAL
  Filled 2020-06-18: qty 30

## 2020-06-18 MED ORDER — POTASSIUM CHLORIDE ER 20 MEQ PO TBCR: 20.0000 meq | EXTENDED_RELEASE_TABLET | Freq: Every day | ORAL | 0 refills | Status: AC

## 2020-06-18 MED ORDER — ALBUTEROL SULFATE HFA 108 (90 BASE) MCG/ACT IN AERS: 2.0000 | INHALATION_SPRAY | RESPIRATORY_TRACT | 2 refills | Status: AC | PRN

## 2020-06-18 MED ORDER — SENNOSIDES-DOCUSATE SODIUM 8.6-50 MG PO TABS: 2.0000 | ORAL_TABLET | Freq: Two times a day (BID) | ORAL | 1 refills | Status: AC

## 2020-06-18 MED ORDER — FUROSEMIDE 20 MG PO TABS: 20.0000 mg | ORAL_TABLET | Freq: Every day | ORAL | 1 refills | Status: AC | PRN

## 2020-06-18 MED ORDER — HEPARIN SOD (PORK) LOCK FLUSH 100 UNIT/ML IV SOLN
INTRAVENOUS | Status: AC
  Filled 2020-06-18: qty 5

## 2020-06-18 MED ORDER — MORPHINE SULFATE ER 30 MG PO TBCR: 60.0000 mg | EXTENDED_RELEASE_TABLET | Freq: Three times a day (TID) | ORAL | 0 refills | Status: AC

## 2020-06-18 NOTE — Progress Notes (Signed)
PTAR running behind for scheduled transportations. Informed [facility] pt is still being discharged, however it is later than anticipated. Per [facility], pt is to contact them when he is transported home. If admissions nurse is still available, they will meet pt at home. Otherwise, will meet with pt tomorrow. Phone numbers and resources provided to pt for [facility] after hours, if needed. Updated pt's son as well.

## 2020-06-18 NOTE — Discharge Instructions (Signed)
Hospice °Hospice is a service that is designed to provide people who are terminally ill and their families with medical, spiritual, and psychological support. Its aim is to improve your quality of life by keeping you as comfortable as possible in the final stages of life. °Who will be my providers when I begin hospice care? °Hospice teams often include: °· A nurse. °· A doctor. The hospice doctor will be available for your care, but you can include your regular doctor or nurse practitioner. °· A social worker. °· A counselor. °· A religious leader (such as a chaplain). °· A dietitian. °· Therapists. °· Trained volunteers who can help with care. °What services does hospice provide? °Hospice services can vary depending on the center or organization. Generally, they include: °· Ways to keep you comfortable, such as: °? Providing care in your home or in a home-like setting. °? Working with your family and friends to help meet your needs. °? Allowing you to enjoy the support of loved ones by receiving much of your basic care from family and friends. °· Pain relief and symptom management. The staff will supply all necessary medicines and equipment so that you can stay comfortable and alert enough to enjoy the company of your friends and family. °· Visits or care from a nurse and doctor. This may include 24-hour on-call services. °· Companionship when you are alone. °· Allowing you and your family to rest. Hospice staff may do light housekeeping, prepare meals, and run errands. °· Counseling. They will make sure your emotional, spiritual, and social needs are being met, as well as those needs of your family members. °· Spiritual care. This will be individualized to meet your needs and your family's needs. It may involve: °? Helping you and your family understand the dying process. °? Helping you say goodbye to your family and friends. °? Performing a specific religious ceremony or ritual. °· Massage. °· Nutrition  therapy. °· Physical and occupational therapy. °· Short-term inpatient care, if something cannot be managed in the home. °· Art or music therapy. °· Bereavement support for grieving family members. °When should hospice care begin? °Most people who use hospice are believed to have less than 6 months to live. °· Your family and health care providers can help you decide when hospice services should begin. °· If you live longer than 6 months but your condition does not improve, your doctor may be able to approve you for continued hospice care. °· If your condition improves, you may discontinue the program. °What should I consider before selecting a program? °Most hospice programs are run by nonprofit, independent organizations. Some are affiliated with hospitals, nursing homes, or home health care agencies. Hospice programs can take place in your home or at a hospice center, hospital, or skilled nursing facility. When choosing a hospice program, ask the following questions: °· What services are available to me? °· What services will be offered to my loved ones? °· How involved will my loved ones be? °· How involved will my health care provider be? °· Who makes up the hospice care team? How are they trained or screened? °· How will my pain and symptoms be managed? °· If my circumstances change, can the services be provided in a different setting, such as my home or in the hospital? °· Is the program reviewed and licensed by the state or certified in some other way? °· What does it cost? Is it covered by insurance? °· If I choose a hospice   center or nursing home, where is the hospice center located? Is it convenient for family and friends?  If I choose a hospice center or nursing home, can my family and friends visit any time?  Will you provide emotional and spiritual support?  Who can my family call with questions? Where can I learn more about hospice? You can learn about existing hospice programs in your area  from your health care providers. You can also read more about hospice online. The websites of the following organizations have helpful information:  [facility] and Palliative Care Organization [facility]): http://www.brown-buchanan.com/  National Association for [facility]): http://massey-hart.com/  Hospice Foundation of America ([place]): www.hospicefoundation.org  American Cancer Society (ACS): www.cancer.org  Hospice Net: www.hospicenet.org  Visiting Nurse Associations of [place] (VNAA): www.vnaa.org You may also find more information by contacting the following agencies:  A local agency on aging.  Your local [employer] chapter.  Your state's department of health or social services. Summary  Hospice is a service that is designed to provide people who are terminally ill and their families with medical, spiritual, and psychological support.  Hospice aims to improve your quality of life by keeping you as comfortable as possible in the final stages of life.  Hospice teams often include a doctor, nurse, social worker, counselor, religious leader,dietitian, therapists, and volunteers.  Hospice care generally includes medicine for symptom management, visits from doctors and nurses, physical and occupational therapy, nutrition counseling, spiritual and emotional counseling, caregiver support, and bereavement support for grieving family members.  Hospice programs can take place in your home or at a hospice center, hospital, or skilled nursing facility. This information is not intended to replace advice given to you by your health care provider. Make sure you discuss any questions you have with your health care provider. Document Revised: 09/05/2019 Document Reviewed: 01/04/2017 Elsevier Patient Education  [place].

## 2020-06-18 NOTE — Discharge Summary (Signed)
[facility]  Physician Discharge Summary   Patient ID: Anthony Escobar MRN: [id number] DOB/AGE: 69-Jun-1952 69 y.o.  Admit date: 06/16/2020 Discharge date: 06/18/2020  PCP: [name], MD  DISCHARGE DIAGNOSES:  Cancer associated pain, improved Metastatic prostate cancer, now hospice candidate Electrolyte abnormalities Pressure injury of skin in the left buttock Macrocytic anemia Essential hypertension Hyperlipidemia History of depression  RECOMMENDATIONS FOR OUTPATIENT FOLLOW UP: 1. Hospice to follow patient at home   Home Health: None Equipment/Devices: Hospice has arranged all DME including hospital bed  CODE STATUS: DNR  DISCHARGE CONDITION: fair  Diet recommendation: As tolerated  INITIAL HISTORY: 69 y.o.malewith medical history significant ofmetastatic prostate cancerinvolving the bones and liver, pathologic fracture of the right hip status post IM nail fixation, COPD, hypertension, hyperlipidemia presented to the ED due to progressive generalized weakness and poor oral intake. Patient is being followed by [facility] as outpatient and oncology has been trying to get him set up with hospice.   Patient also mention uncontrolled pain in his right hip left knee and right upper quadrant.  He has been taking his MS Contin without much relief.  Had a bowel movement on June 21.    Consultations:  Medical oncology was notified  Procedures:  None   HOSPITAL COURSE:   Uncontrolled cancer associated pain Patient's pain was very poorly controlled.  According to his granddaughter patient apparently was not taking his morphine as prescribed.  Patient's dose of morphine was increased here in the hospital.  His pain is much better controlled now.  He is having regular bowel movements.  Continue with bowel regimen.  He was recently given a prescription for MS Contin and hydromorphone on June 10.  Discussed with his granddaughter who is RN here  at this hospital.  She was asked to check on the patient periodically to make sure he is not getting overmedicated.    Metastatic prostate cancer Follows with Dr. Alen Blew.  Patient is reaching end-stage disease.  Does not have any other treatment options left.  His oral intake has been poor which could be due to pain.  Prognosis is poor.  Likely does not have more than a few weeks.    Patient to be discharged home with hospice services.    Hypokalemia/hypomagnesemia Possibly due to poor nutrition.  Potassium is improved.    Will be discharged with a prescription for potassium chloride.  Abnormal LFTs Due to liver mets.  Pressure injury of skin, present on admission Located in the left buttock.  Leukocytosis No evidence for infection.  Afebrile.  No indication for antibacterials.  Macrocytic anemia Hemoglobin is low but stable.  No evidence for overt bleeding.  Noted to be on Xarelto Patient was initially on Lovenox after he underwent orthopedic surgery in October 2020.  He was subsequently changed over to [place] in November.  No DVT noted on Doppler studies done in May 2021.  At this time we can discontinue Xarelto since patient is transitioning to hospice.  Plus patient not sure if he is still taking this medication or not.  Essential hypertension Blood pressure is reasonably well controlled.  Lisinopril was discontinued on discharge last month.  Hyperlipidemia Noted to be on statin.  Will hold it for now due to abnormal LFTs.  History of depression Noted to be on Elavil.  Pressure injury Pressure Injury 06/16/20 Buttocks Left;Mid Stage 2 -  Partial thickness loss of dermis presenting as a shallow open injury with a red, pink wound bed without slough. (  Active)  06/16/20 1745  Location: Buttocks  Location Orientation: Left;Mid  Staging: Stage 2 -  Partial thickness loss of dermis presenting as a shallow open injury with a red, pink wound bed without slough.  Wound  Description (Comments):   Present on Admission: Yes     Patient's pain is better controlled.  Okay for discharge home with hospice.  Discussed with his granddaughter.   PERTINENT LABS:  The results of significant diagnostics from this hospitalization (including imaging, microbiology, ancillary and laboratory) are listed below for reference.    Microbiology: Recent Results (from the past 240 hour(s))  SARS Coronavirus 2 by RT PCR (hospital order, performed in [facility] hospital lab) Nasopharyngeal Nasopharyngeal Swab     Status: None   Collection Time: 06/16/20  1:28 PM   Specimen: Nasopharyngeal Swab  Result Value Ref Range Status   SARS Coronavirus 2 NEGATIVE NEGATIVE Final    Comment: (NOTE) SARS-CoV-2 target nucleic acids are NOT DETECTED.  The SARS-CoV-2 RNA is generally detectable in upper and lower respiratory specimens during the acute phase of infection. The lowest concentration of SARS-CoV-2 viral copies this assay can detect is 250 copies / mL. A negative result does not preclude SARS-CoV-2 infection and should not be used as the sole basis for treatment or other patient management decisions.  A negative result may occur with improper specimen collection / handling, submission of specimen other than nasopharyngeal swab, presence of viral mutation(s) within the areas targeted by this assay, and inadequate number of viral copies (<250 copies / mL). A negative result must be combined with clinical observations, patient history, and epidemiological information.  Fact Sheet for Patients:   StrictlyIdeas.no  Fact Sheet for Healthcare Providers: BankingDealers.co.za  This test is not yet approved or  cleared by the [place] FDA and has been authorized for detection and/or diagnosis of SARS-CoV-2 by FDA under an Emergency Use Authorization (EUA).  This EUA will remain in effect (meaning this test can be used) for the  duration of the COVID-19 declaration under Section 564(b)(1) of the Act, 21 U.S.C. section 360bbb-3(b)(1), unless the authorization is terminated or revoked sooner.  Performed at [facility], [place] [address] [place], [place] [address]   Culture, blood (routine x 2)     Status: None (Preliminary result)   Collection Time: 06/16/20  3:53 PM   Specimen: BLOOD  Result Value Ref Range Status   Specimen Description   Final    BLOOD RIGHT ANTECUBITAL Performed at [place] [address], [place], [place] [address]    Special Requests   Final    BOTTLES DRAWN AEROBIC ONLY Blood Culture adequate volume Performed at [place] [address] [place], [place] [address]    Culture   Final    NO GROWTH 2 DAYS Performed at [place] [address] [place], [place] [address]    Report Status PENDING  Incomplete  Culture, blood (routine x 2)     Status: None (Preliminary result)   Collection Time: 06/16/20  3:53 PM   Specimen: BLOOD RIGHT HAND  Result Value Ref Range Status   Specimen Description   Final    BLOOD RIGHT HAND Performed at [facility] [address], [place], [place] [address]    Special Requests   Final    BOTTLES DRAWN AEROBIC ONLY Blood Culture adequate volume Performed at [facility] [address] [place], [place] [address]  Culture   Final    NO GROWTH 2 DAYS Performed at [facility], [place] [address] [place], [place] [address]    Report Status PENDING  Incomplete     Labs:  COVID-19 Labs  No results for input(s): DDIMER, FERRITIN, LDH, CRP in the last 72 hours.  Lab Results  Component Value Date   SARSCOV2NAA NEGATIVE 06/16/2020   [place] NEGATIVE 05/11/2020   [place] NEGATIVE 10/29/2019   SARSCOV2NAA NOT DETECTED 10/18/2019      Basic Metabolic Panel: Recent Labs  Lab 06/16/20 1113 06/17/20 0525 06/18/20 1041    NA 134* 134* 133*  K 3.2* 3.5 4.0  CL 98 99 96*  CO2 26 26 28   GLUCOSE 99 87 119*  BUN 10 7* 10  CREATININE 0.77 0.80 0.79  CALCIUM 7.5* 7.5* 7.6*  MG 1.6*  --  1.9   Liver Function Tests: Recent Labs  Lab 06/16/20 1113 06/17/20 0525  AST 62* 60*  ALT 22 21  ALKPHOS 586* 551*  BILITOT 0.9 0.9  PROT 4.8* 4.7*  ALBUMIN 2.1* 1.9*   CBC: Recent Labs  Lab 06/16/20 1113 06/17/20 0525  WBC 41.9* 36.3*  NEUTROABS 34.9*  --   HGB 7.7* 7.5*  HCT 23.4* 23.6*  MCV 103.1* 105.8*  PLT 404* 417*     IMAGING STUDIES DG Chest 2 View  Result Date: 06/16/2020 CLINICAL DATA:  69 year old male with history of weakness, shortness of breath and decreased p.o. intake over the past 2 weeks. EXAM: CHEST - 2 VIEW COMPARISON:  Chest x-ray 10/19/2019. FINDINGS: Lung volumes are normal. No consolidative airspace disease. Trace bilateral pleural effusions. No pneumothorax. No pulmonary nodule or mass noted. Pulmonary vasculature and the cardiomediastinal silhouette are within normal limits. Atherosclerosis in the thoracic aorta. Right subclavian single-lumen porta cath with tip terminating in the superior cavoatrial junction. IMPRESSION: 1. Trace bilateral pleural effusions. 2. Aortic atherosclerosis. Electronically Signed   By: Vinnie Langton M.D.   On: 06/16/2020 12:47    DISCHARGE EXAMINATION: Vitals:   06/17/20 2237 06/18/20 0634 06/18/20 0909 06/18/20 1316  BP: 94/69 113/70  114/74  Pulse: 99 (!) 103  (!) 103  Resp: 16 15  16   Temp: 98 F (36.7 C) 98 F (36.7 C)  97.9 F (36.6 C)  TempSrc: Oral Oral  Oral  SpO2: (!) 76% (!) 86% (!) 85% 97%  Weight:      Height:       General appearance: Awake alert.  In no distress Resp: Clear to auscultation bilaterally.  Normal effort Cardio: S1-S2 is normal regular.  No S3-S4.  No rubs murmurs or bruit GI: Abdomen is soft.  Nontender nondistended.  Bowel sounds are present normal.  No masses organomegaly    DISPOSITION: Home with  hospice  Discharge Instructions    Call MD for:  difficulty breathing, headache or visual disturbances   Complete by: As directed    Call MD for:  extreme fatigue   Complete by: As directed    Call MD for:  hives   Complete by: As directed    Call MD for:  persistant dizziness or light-headedness   Complete by: As directed    Call MD for:  persistant nausea and vomiting   Complete by: As directed    Call MD for:  severe uncontrolled pain   Complete by: As directed    Call MD for:  temperature >100.4   Complete by: As directed    Discharge instructions   Complete by:  As directed    You were cared for by a hospitalist during your hospital stay. If you have any questions about your discharge medications or the care you received while you were in the hospital after you are discharged, you can call the unit and asked to speak with the hospitalist on call if the hospitalist that took care of you is not available. Once you are discharged, your primary care physician will handle any further medical issues. Please note that NO REFILLS for any discharge medications will be authorized once you are discharged, as it is imperative that you return to your primary care physician (or establish a relationship with a primary care physician if you do not have one) for your aftercare needs so that they can reassess your need for medications and monitor your lab values. If you do not have a primary care physician, you can call [phone] for a physician referral.   Increase activity slowly   Complete by: As directed    No wound care   Complete by: As directed         Allergies as of 06/18/2020   No Known Allergies     Medication List    TAKE these medications   acetaminophen 650 MG CR tablet Commonly known as: TYLENOL Take 650 mg by mouth every 8 (eight) hours as needed for pain.   albuterol 108 (90 Base) MCG/ACT inhaler Commonly known as: ProAir HFA Inhale 2 puffs into the lungs every 4 (four)  hours as needed for wheezing or shortness of breath.   amitriptyline 75 MG tablet Commonly known as: ELAVIL Take 75 mg by mouth at bedtime.   cholecalciferol 25 MCG (1000 UNIT) tablet Commonly known as: VITAMIN D3 Take 1,000 Units by mouth daily.   COQ10 PO Take 1 tablet by mouth daily.   feeding supplement (ENSURE ENLIVE) Liqd Take 237 mLs by mouth 2 (two) times daily between meals.   furosemide 20 MG tablet Commonly known as: LASIX Take 1 tablet (20 mg total) by mouth daily as needed for edema. What changed: See the new instructions.   HYDROmorphone 4 MG tablet Commonly known as: Dilaudid Take 1 tablet (4 mg total) by mouth every 4 (four) hours as needed for severe pain.   ibuprofen 200 MG tablet Commonly known as: ADVIL Take 400 mg by mouth every 6 (six) hours as needed for moderate pain.   Incruse Ellipta 62.5 MCG/INH Aepb Generic drug: umeclidinium bromide Inhale 1 puff into the lungs daily.   morphine 30 MG 12 hr tablet Commonly known as: MS CONTIN Take 2 tablets (60 mg total) by mouth in the morning, at noon, and at bedtime. What changed: how much to take   multivitamin tablet Take 1 tablet by mouth daily.   polyethylene glycol 17 g packet Commonly known as: MIRALAX / GLYCOLAX Take 17 g by mouth 2 (two) times daily.   Potassium Chloride ER 20 MEQ Tbcr Take 20 mEq by mouth daily.   prochlorperazine 10 MG tablet Commonly known as: COMPAZINE Take 1 tablet (10 mg total) by mouth every 6 (six) hours as needed for nausea or vomiting.   senna-docusate 8.6-50 MG tablet Commonly known as: Senokot-S Take 2 tablets by mouth 2 (two) times daily.   Tiotropium Bromide Monohydrate 2.5 MCG/ACT Aers Commonly known as: Spiriva Respimat Inhale 2 puffs into the lungs daily.   TURMERIC PO Take 1 tablet by mouth daily.   Wixela Inhub 500-50 MCG/DOSE Aepb Generic drug: Fluticasone-Salmeterol Inhale 1 puff into the  lungs 2 (two) times daily.          TOTAL  DISCHARGE TIME: 35 minutes  Takyla Kuchera [employer] on [email or web address]  06/18/2020, 2:21 PM

## 2020-06-18 NOTE — TOC Transition Note (Signed)
Transition of Care [facility]) - CM/SW Discharge Note   Patient Details  Name: Anthony Escobar MRN: [id number] Date of Birth: 01-17-1951  [facility] CM/SW Contact:  Lynnell Catalan, RN Phone Number: 06/18/2020, 10:40 AM   Clinical Narrative:    Pt was referred to [facility] for home hospice by Dr. Hazeline Junker office. Pt to dc home today. All DME delivered to the home per son. Per hospice liaison pt to be admitted to hospice at 5pm at home. [facility] transport set up for 3pm and son made aware. Yellow DNR on chart for transport.   Final next level of care: Home w Hospice Care Barriers to Discharge: No Barriers Identified   Discharge Placement                Patient to be transferred to facility by: [place] Name of family member notified: Son Patient and family notified of of transfer: 06/18/20  Discharge Plan and Services                          HH Arranged: Disease Management [place] Agency: Hospice and [place]       Readmission Risk Interventions No flowsheet data found.

## 2020-06-20 ENCOUNTER — Other Ambulatory Visit: Payer: Self-pay | Admitting: Oncology

## 2020-06-20 MED ORDER — ZOLPIDEM TARTRATE 10 MG PO TABS: 10.0000 mg | ORAL_TABLET | Freq: Every evening | ORAL | 0 refills | Status: AC | PRN

## 2020-06-21 LAB — CULTURE, BLOOD (ROUTINE X 2)
Culture: NO GROWTH
Culture: NO GROWTH
Special Requests: ADEQUATE
Special Requests: ADEQUATE

## 2020-06-25 MED FILL — Dexamethasone Sodium Phosphate Inj 100 MG/10ML: INTRAMUSCULAR | Qty: 1 | Status: AC

## 2020-06-26 ENCOUNTER — Inpatient Hospital Stay: Payer: Medicare Other

## 2020-06-26 ENCOUNTER — Inpatient Hospital Stay: Payer: Medicare Other | Admitting: Oncology

## 2020-06-28 ENCOUNTER — Ambulatory Visit: Payer: Medicare Other

## 2020-07-17 ENCOUNTER — Ambulatory Visit: Payer: Medicare Other | Admitting: Oncology

## 2020-07-17 ENCOUNTER — Other Ambulatory Visit: Payer: Medicare Other

## 2020-07-17 ENCOUNTER — Ambulatory Visit: Payer: Medicare Other

## 2020-07-19 ENCOUNTER — Ambulatory Visit: Payer: Medicare Other

## 2020-07-27 DEATH — deceased

## 2020-09-22 IMAGING — CT CT ABD-PELV W/ CM
2 of 5 series · 15 of 46 positions shown, 17 images · IV contrast (omnipaque)
Comparison: 02/28/2019, 10/03/2013

CLINICAL DATA: Acute generalized abdominal pain and fever. Postop
hip surgery with diffuse abdominal pain, distention and fever.
History of prostate cancer.

EXAM:
CT ABDOMEN AND PELVIS WITH CONTRAST
TECHNIQUE: Multidetector CT imaging of the abdomen and pelvis was performed
using the standard protocol following bolus administration of
intravenous contrast.
CONTRAST:  100mL OMNIPAQUE IOHEXOL 300 MG/ML  SOLN

[Series 3: abd/ pelvis 5.0 i30f 2 · axial · 0.87mm/px · z∈[+972,+1397]mm · 12 of 95 slices shown, 14 images]
[im 5/95  soft-tissue]
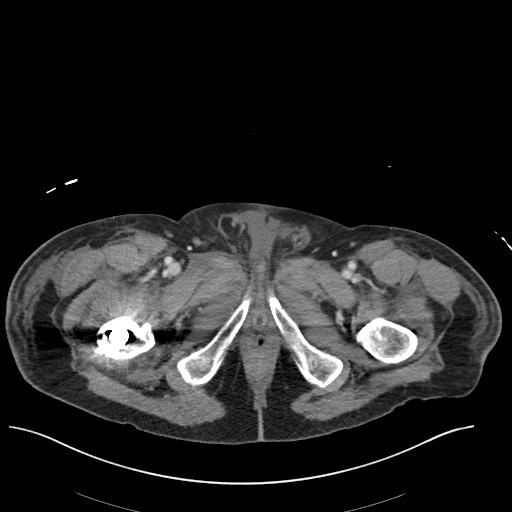
[im 5/95  bone]
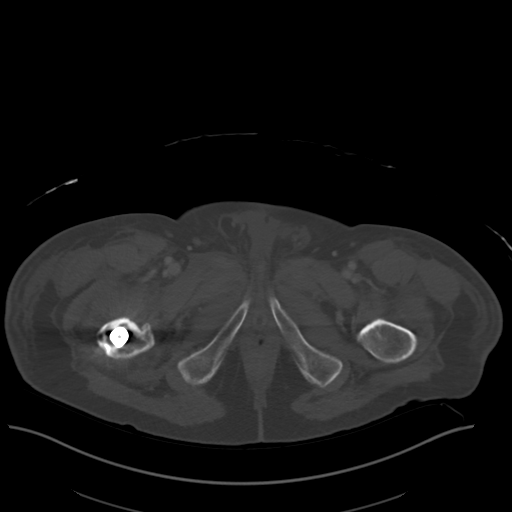
[im 15/95  soft-tissue]
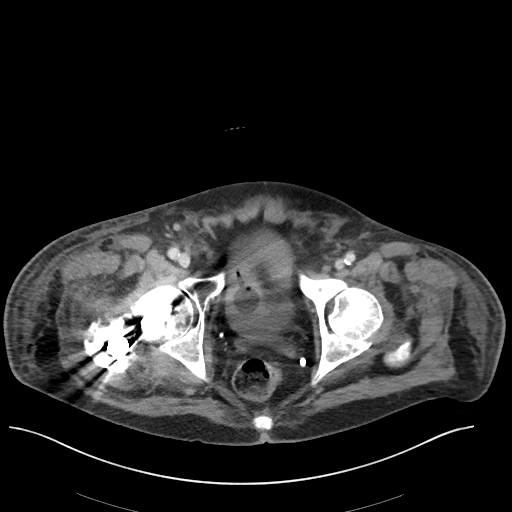
[im 20/95  soft-tissue]
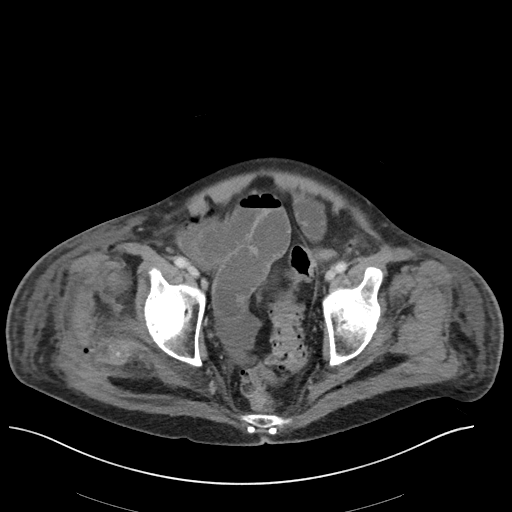
[im 30/95  soft-tissue]
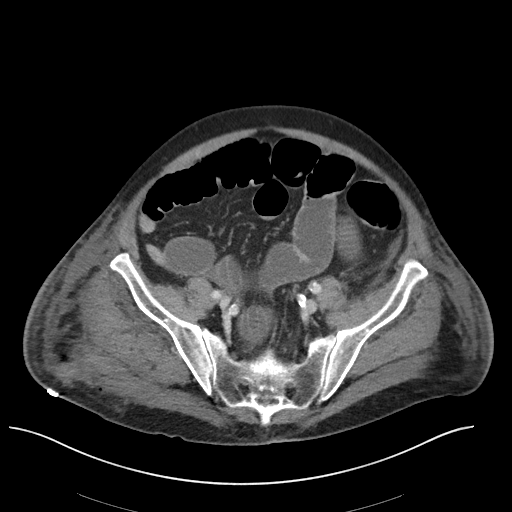
[im 35/95  soft-tissue]
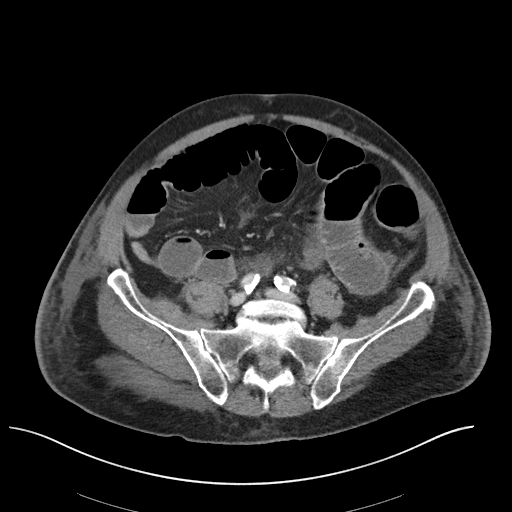
[im 45/95  soft-tissue]
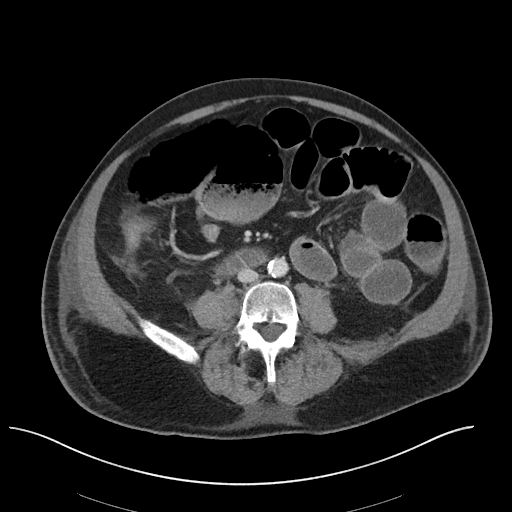
[im 50/95  soft-tissue]
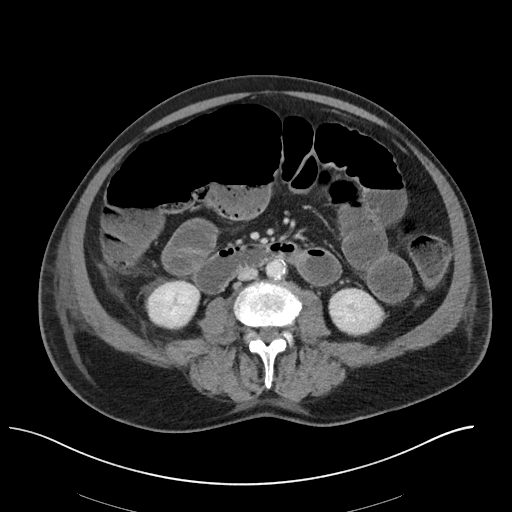
[im 60/95  soft-tissue]
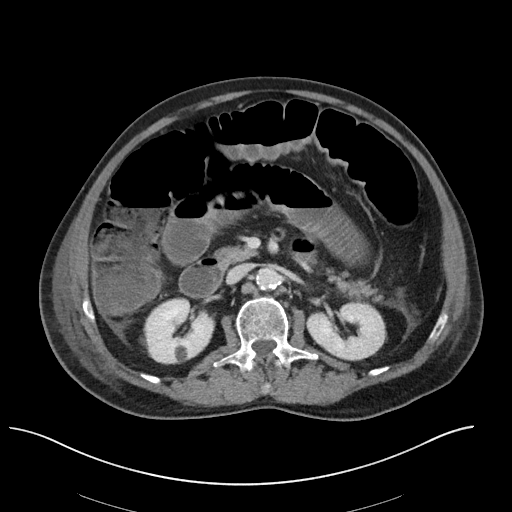
[im 65/95  soft-tissue]
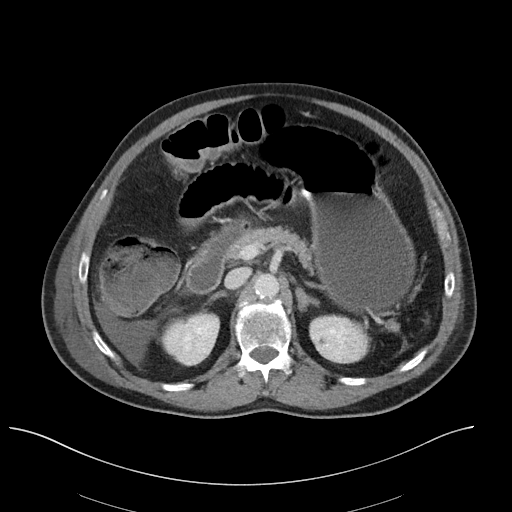
[im 65/95  bone]
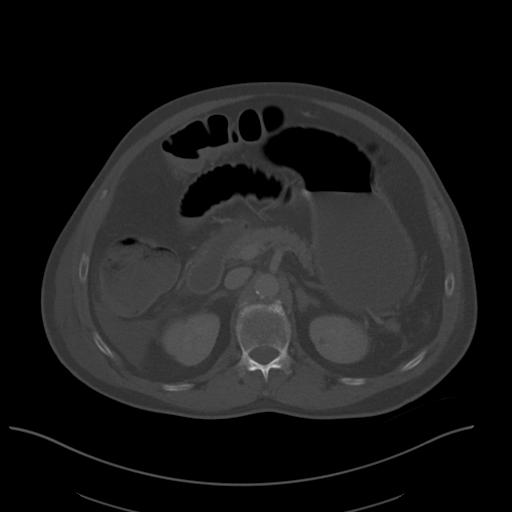
[im 75/95  soft-tissue]
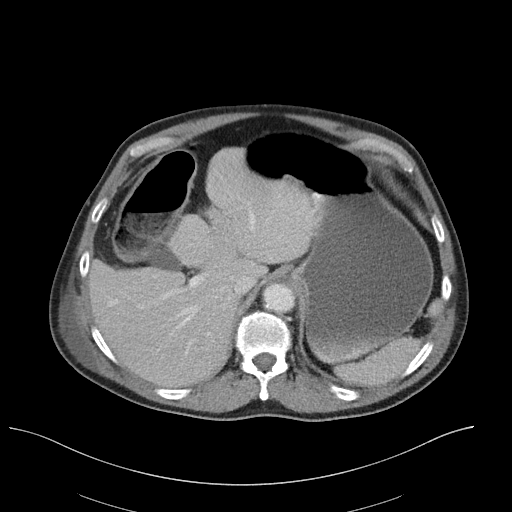
[im 80/95  soft-tissue]
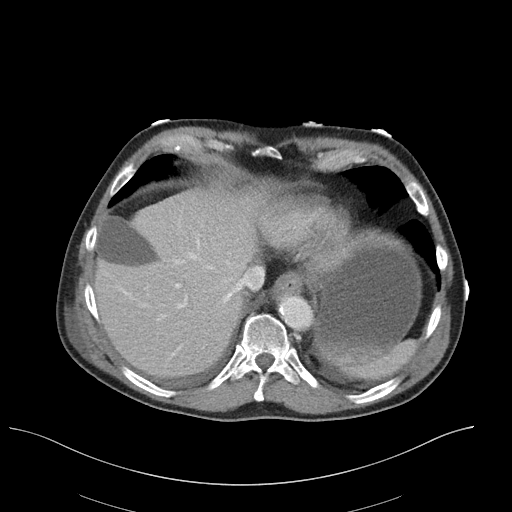
[im 90/95  soft-tissue]
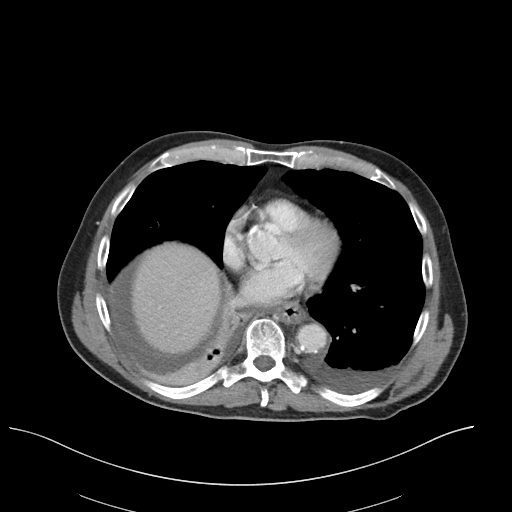

[Series 6: coronal soft tissue · coronal · 0.89mm/px · 3 of 101 slices shown]
[im 34/101  soft-tissue]
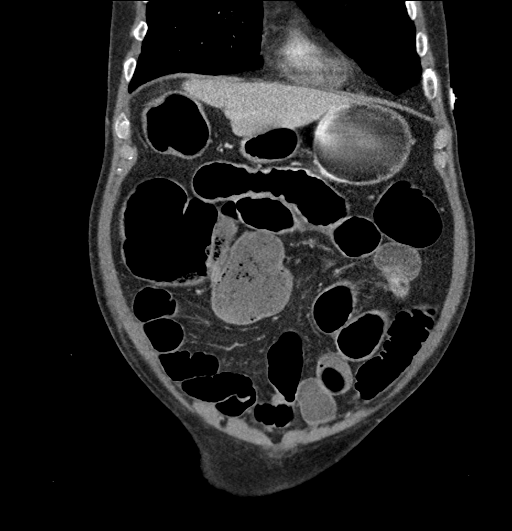
[im 45/101  soft-tissue]
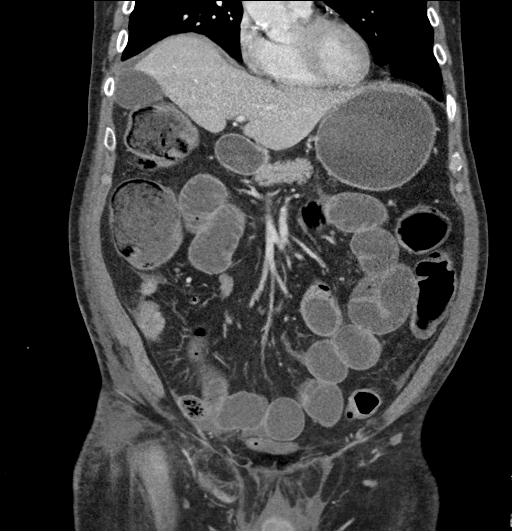
[im 56/101  soft-tissue]
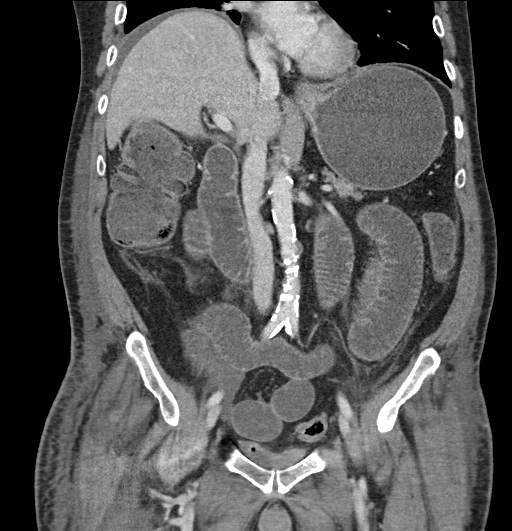

[15 of 46 positions shown; findings below may reference images not displayed]

FINDINGS: Lower chest: Small bilateral pleural effusions with posterior
bibasilar atelectasis right worse than left. Calcified plaque over
the left anterior descending coronary artery as well as calcified
plaque over the distal descending thoracic aorta.

Hepatobiliary: Liver, gallbladder and biliary tree are normal.

Pancreas: Normal.

Spleen: Normal.

Adrenals/Urinary Tract: Adrenal glands are normal. Kidneys are
normal in size without hydronephrosis or nephrolithiasis. 1.5 cm
cyst over the mid pole left kidney. Several other smaller sub cm
bilateral cortical hypodensities too small to characterize but
likely cysts. Ureters and bladder are normal. Foley catheter is
present within a decompressed bladder.

Stomach/Bowel: Stomach is mildly distended. There are multiple air
and fluid-filled dilated small bowel loops. Terminal ileum is
normal. Mild wall thickening of an ileal loop over the right lower
quadrant as this may represent a transition point. Findings may be
due to a regional enteritis of infectious or inflammatory nature.
Cecum is just right of midline in the mid abdomen. Appendix is
normal. Air and stool present throughout the colon. Minimal
diverticulosis of the sigmoid colon.

Vascular/Lymphatic: Moderate calcified plaque over the abdominal
aorta. No adenopathy.

Reproductive: Region of the prostate is smaller with 2 cm rounded
hypodense focus over the left side of the prostate.

Other: Small amount of patchy free fluid within the abdomen/pelvis.
No free peritoneal air. Small right inguinal hernia containing only
peritoneal fat.

Musculoskeletal: Evidence of patient's right hip fracture post
internal fixation with intramedullary nail and associated 2 screws
bridging the fracture site into the femoral head intact.
Postoperative changes in the soft tissues of the right hip
compatible patient's recent surgery. Subtle air-fluid level within
the subcutaneous fat adjacent the lateral aspect of the right
gluteus maximus likely postoperative change although developing
infection is possible. Mild degenerate change of the hips and spine.
Mild compression deformity over the superior endplate of L1 new
since the previous exam with possible early depression of the
superior endplate of L2.
IMPRESSION: 1. Multiple air and fluid-filled dilated small bowel loops with
possible transition point over the right lower quadrant over the
ileum with there is a thick-walled ileal loop. Patchy free fluid in
the abdomen/pelvis. Findings likely due to small bowel obstruction
with possible regional enteritis involving and ileal loop in the
right lower quadrant. Postoperative ileus less likely.

2. Small bilateral pleural effusions with associated bibasilar
atelectasis right worse than left. Infection in the right base is
possible.

3. Fixation of right hip fracture with hardware intact.
Postoperative changes over the soft tissues of the right hip
compatible with patient's recent surgery. Subtle air-fluid level
over the soft tissues of the surgical bed likely postoperative
although developing infection is possible.

4. Compression deformity involving the superior endplate of L1 new
since the previous exam with possible early depression of the
superior endplate of L2.

5. Bilateral renal cysts.

6. Prostate gland smaller than seen previously with indeterminate 2
cm hypodense focus along the left side of the prostate in this
patient with known prostate cancer.

7.  Diverticulosis of the colon.

8.  Aortic Atherosclerosis ([id number]-EYA.A).

9.  Right inguinal hernia containing only peritoneal fat.

## 2020-10-07 NOTE — Progress Notes (Signed)
  Radiation Oncology         [phone]) [phone] ________________________________  Name: SIDDH VANDEVENTER MRN: [id number]  Date: 05/07/2020  DOB: 1951-04-23  End of Treatment Note  Diagnosis:  69 y.o.gentleman with castrate-resistant prostate cancer with osseous metastasespresenting today with a new, painful metastasis in the proximal left tibia and left acetabulum    Indication for treatment:  Palliation       Radiation treatment dates:   4/29-5/12/21  Site/dose:   30 Gy in 10 fractions to the left tibia and acetabulum  Beams/energy:   Treated with 2 isocenters at the following gantry angles:   Narrative: The patient tolerated radiation treatment relatively well.     Plan: The patient has completed radiation treatment. The patient will return to radiation oncology clinic for routine followup in one month. I advised him to call or return sooner if he has any questions or concerns related to his recovery or treatment. ________________________________  Sheral Apley. Tammi Klippel, M.D.
